# Patient Record
Sex: Female | Born: 1935 | Race: White | Hispanic: No | Marital: Married | Smoking: Former smoker
Health system: Southern US, Community
[De-identification: ages and names within clinical notes are randomized; demographics above are authoritative.]

## PROBLEM LIST (undated history)

## (undated) DIAGNOSIS — I4891 Unspecified atrial fibrillation: Secondary | ICD-10-CM

## (undated) DIAGNOSIS — F0390 Unspecified dementia without behavioral disturbance: Secondary | ICD-10-CM

## (undated) DIAGNOSIS — C801 Malignant (primary) neoplasm, unspecified: Secondary | ICD-10-CM

## (undated) DIAGNOSIS — I10 Essential (primary) hypertension: Secondary | ICD-10-CM

## (undated) DIAGNOSIS — E785 Hyperlipidemia, unspecified: Secondary | ICD-10-CM

## (undated) DIAGNOSIS — Z923 Personal history of irradiation: Secondary | ICD-10-CM

## (undated) DIAGNOSIS — K5900 Constipation, unspecified: Secondary | ICD-10-CM

## (undated) DIAGNOSIS — K219 Gastro-esophageal reflux disease without esophagitis: Secondary | ICD-10-CM

## (undated) DIAGNOSIS — E039 Hypothyroidism, unspecified: Secondary | ICD-10-CM

## (undated) HISTORY — DX: Personal history of irradiation: Z92.3

## (undated) HISTORY — PX: BREAST SURGERY: SHX581

---

## 1997-09-07 ENCOUNTER — Other Ambulatory Visit: Admission: RE | Admit: 1997-09-07 | Discharge: 1997-09-07 | Payer: Self-pay | Admitting: Family Medicine

## 1997-11-16 ENCOUNTER — Other Ambulatory Visit: Admission: RE | Admit: 1997-11-16 | Discharge: 1997-11-16 | Payer: Self-pay | Admitting: Family Medicine

## 1998-07-26 ENCOUNTER — Other Ambulatory Visit: Admission: RE | Admit: 1998-07-26 | Discharge: 1998-07-26 | Payer: Self-pay | Admitting: Obstetrics and Gynecology

## 1998-10-15 ENCOUNTER — Ambulatory Visit (HOSPITAL_COMMUNITY): Admission: RE | Admit: 1998-10-15 | Discharge: 1998-10-15 | Payer: Self-pay | Admitting: Obstetrics & Gynecology

## 1999-04-02 ENCOUNTER — Ambulatory Visit (HOSPITAL_COMMUNITY): Admission: RE | Admit: 1999-04-02 | Discharge: 1999-04-02 | Payer: Self-pay | Admitting: Obstetrics and Gynecology

## 1999-04-02 ENCOUNTER — Encounter: Payer: Self-pay | Admitting: Obstetrics and Gynecology

## 1999-05-06 DIAGNOSIS — Z923 Personal history of irradiation: Secondary | ICD-10-CM

## 1999-05-06 HISTORY — DX: Personal history of irradiation: Z92.3

## 1999-05-27 ENCOUNTER — Encounter: Payer: Self-pay | Admitting: General Surgery

## 1999-05-28 ENCOUNTER — Encounter (INDEPENDENT_AMBULATORY_CARE_PROVIDER_SITE_OTHER): Payer: Self-pay | Admitting: *Deleted

## 1999-05-28 ENCOUNTER — Ambulatory Visit (HOSPITAL_COMMUNITY): Admission: RE | Admit: 1999-05-28 | Discharge: 1999-05-28 | Payer: Self-pay | Admitting: General Surgery

## 1999-06-10 ENCOUNTER — Encounter: Admission: RE | Admit: 1999-06-10 | Discharge: 1999-06-10 | Payer: Self-pay | Admitting: General Surgery

## 1999-06-10 ENCOUNTER — Encounter: Payer: Self-pay | Admitting: General Surgery

## 1999-06-11 ENCOUNTER — Encounter: Payer: Self-pay | Admitting: General Surgery

## 1999-06-11 ENCOUNTER — Ambulatory Visit (HOSPITAL_COMMUNITY): Admission: RE | Admit: 1999-06-11 | Discharge: 1999-06-12 | Payer: Self-pay | Admitting: General Surgery

## 1999-06-11 ENCOUNTER — Encounter (INDEPENDENT_AMBULATORY_CARE_PROVIDER_SITE_OTHER): Payer: Self-pay | Admitting: *Deleted

## 1999-06-26 ENCOUNTER — Encounter: Admission: RE | Admit: 1999-06-26 | Discharge: 1999-09-24 | Payer: Self-pay | Admitting: Radiation Oncology

## 1999-12-26 ENCOUNTER — Other Ambulatory Visit: Admission: RE | Admit: 1999-12-26 | Discharge: 1999-12-26 | Payer: Self-pay | Admitting: Obstetrics and Gynecology

## 2000-03-05 ENCOUNTER — Encounter: Admission: RE | Admit: 2000-03-05 | Discharge: 2000-03-05 | Payer: Self-pay | Admitting: General Surgery

## 2000-03-05 ENCOUNTER — Encounter: Payer: Self-pay | Admitting: General Surgery

## 2000-03-11 ENCOUNTER — Ambulatory Visit (HOSPITAL_COMMUNITY): Admission: RE | Admit: 2000-03-11 | Discharge: 2000-03-11 | Payer: Self-pay | Admitting: Gastroenterology

## 2001-02-09 ENCOUNTER — Other Ambulatory Visit: Admission: RE | Admit: 2001-02-09 | Discharge: 2001-02-09 | Payer: Self-pay | Admitting: Obstetrics and Gynecology

## 2001-03-17 ENCOUNTER — Encounter: Admission: RE | Admit: 2001-03-17 | Discharge: 2001-03-17 | Payer: Self-pay | Admitting: General Surgery

## 2001-03-17 ENCOUNTER — Encounter: Payer: Self-pay | Admitting: General Surgery

## 2002-02-10 ENCOUNTER — Other Ambulatory Visit: Admission: RE | Admit: 2002-02-10 | Discharge: 2002-02-10 | Payer: Self-pay | Admitting: Obstetrics and Gynecology

## 2002-04-20 ENCOUNTER — Encounter: Admission: RE | Admit: 2002-04-20 | Discharge: 2002-04-20 | Payer: Self-pay | Admitting: General Surgery

## 2002-04-20 ENCOUNTER — Encounter: Payer: Self-pay | Admitting: General Surgery

## 2003-02-28 ENCOUNTER — Ambulatory Visit: Admission: RE | Admit: 2003-02-28 | Discharge: 2003-02-28 | Payer: Self-pay | Admitting: Radiation Oncology

## 2003-03-20 ENCOUNTER — Other Ambulatory Visit: Admission: RE | Admit: 2003-03-20 | Discharge: 2003-03-20 | Payer: Self-pay | Admitting: Obstetrics and Gynecology

## 2003-04-26 ENCOUNTER — Encounter: Admission: RE | Admit: 2003-04-26 | Discharge: 2003-04-26 | Payer: Self-pay | Admitting: Family Medicine

## 2003-07-10 ENCOUNTER — Ambulatory Visit (HOSPITAL_BASED_OUTPATIENT_CLINIC_OR_DEPARTMENT_OTHER): Admission: RE | Admit: 2003-07-10 | Discharge: 2003-07-10 | Payer: Self-pay | Admitting: Otolaryngology

## 2003-08-07 ENCOUNTER — Encounter: Admission: RE | Admit: 2003-08-07 | Discharge: 2003-08-07 | Payer: Self-pay | Admitting: Family Medicine

## 2003-09-08 ENCOUNTER — Ambulatory Visit (HOSPITAL_COMMUNITY): Admission: RE | Admit: 2003-09-08 | Discharge: 2003-09-09 | Payer: Self-pay | Admitting: Urology

## 2004-02-06 ENCOUNTER — Ambulatory Visit: Admission: RE | Admit: 2004-02-06 | Discharge: 2004-02-06 | Payer: Self-pay | Admitting: Radiation Oncology

## 2004-05-03 ENCOUNTER — Encounter: Admission: RE | Admit: 2004-05-03 | Discharge: 2004-05-03 | Payer: Self-pay | Admitting: Family Medicine

## 2004-10-09 ENCOUNTER — Ambulatory Visit: Admission: RE | Admit: 2004-10-09 | Discharge: 2004-10-09 | Payer: Self-pay | Admitting: Radiation Oncology

## 2005-05-15 ENCOUNTER — Encounter: Admission: RE | Admit: 2005-05-15 | Discharge: 2005-05-15 | Payer: Self-pay | Admitting: Family Medicine

## 2005-05-29 ENCOUNTER — Other Ambulatory Visit: Admission: RE | Admit: 2005-05-29 | Discharge: 2005-05-29 | Payer: Self-pay | Admitting: Obstetrics and Gynecology

## 2006-06-01 ENCOUNTER — Encounter: Admission: RE | Admit: 2006-06-01 | Discharge: 2006-06-01 | Payer: Self-pay | Admitting: Family Medicine

## 2006-10-06 ENCOUNTER — Ambulatory Visit: Admission: RE | Admit: 2006-10-06 | Discharge: 2006-10-06 | Payer: Self-pay | Admitting: Gynecologic Oncology

## 2006-10-06 ENCOUNTER — Encounter: Payer: Self-pay | Admitting: Gynecologic Oncology

## 2007-01-28 ENCOUNTER — Encounter: Admission: RE | Admit: 2007-01-28 | Discharge: 2007-03-30 | Payer: Self-pay | Admitting: Family Medicine

## 2007-05-14 ENCOUNTER — Encounter: Admission: RE | Admit: 2007-05-14 | Discharge: 2007-05-14 | Payer: Self-pay | Admitting: Family Medicine

## 2007-07-08 ENCOUNTER — Emergency Department (HOSPITAL_COMMUNITY): Admission: EM | Admit: 2007-07-08 | Discharge: 2007-07-08 | Payer: Self-pay | Admitting: Emergency Medicine

## 2007-10-05 ENCOUNTER — Encounter: Admission: RE | Admit: 2007-10-05 | Discharge: 2007-10-05 | Payer: Self-pay | Admitting: Family Medicine

## 2008-03-02 ENCOUNTER — Other Ambulatory Visit: Admission: RE | Admit: 2008-03-02 | Discharge: 2008-03-02 | Payer: Self-pay | Admitting: Obstetrics and Gynecology

## 2008-11-17 ENCOUNTER — Encounter: Admission: RE | Admit: 2008-11-17 | Discharge: 2008-11-17 | Payer: Self-pay | Admitting: Radiation Oncology

## 2009-11-29 ENCOUNTER — Encounter: Admission: RE | Admit: 2009-11-29 | Discharge: 2009-11-29 | Payer: Self-pay | Admitting: Family Medicine

## 2009-12-06 ENCOUNTER — Encounter (INDEPENDENT_AMBULATORY_CARE_PROVIDER_SITE_OTHER): Payer: Self-pay | Admitting: Internal Medicine

## 2009-12-06 ENCOUNTER — Ambulatory Visit: Payer: Self-pay | Admitting: Cardiology

## 2009-12-06 ENCOUNTER — Inpatient Hospital Stay (HOSPITAL_COMMUNITY): Admission: EM | Admit: 2009-12-06 | Discharge: 2009-12-10 | Payer: Self-pay | Admitting: Emergency Medicine

## 2010-07-19 LAB — CBC
HCT: 35.5 % — ABNORMAL LOW (ref 36.0–46.0)
HCT: 36.2 % (ref 36.0–46.0)
HCT: 40.7 % (ref 36.0–46.0)
Hemoglobin: 11.2 g/dL — ABNORMAL LOW (ref 12.0–15.0)
Hemoglobin: 11.4 g/dL — ABNORMAL LOW (ref 12.0–15.0)
Hemoglobin: 12 g/dL (ref 12.0–15.0)
Hemoglobin: 12.1 g/dL (ref 12.0–15.0)
Hemoglobin: 13.9 g/dL (ref 12.0–15.0)
MCH: 30.5 pg (ref 26.0–34.0)
MCH: 30.5 pg (ref 26.0–34.0)
MCH: 31.5 pg (ref 26.0–34.0)
MCHC: 32.9 g/dL (ref 30.0–36.0)
MCHC: 33.1 g/dL (ref 30.0–36.0)
MCHC: 34.1 g/dL (ref 30.0–36.0)
MCHC: 34.2 g/dL (ref 30.0–36.0)
MCV: 92.3 fL (ref 78.0–100.0)
Platelets: 148 10*3/uL — ABNORMAL LOW (ref 150–400)
Platelets: 149 10*3/uL — ABNORMAL LOW (ref 150–400)
Platelets: 160 10*3/uL (ref 150–400)
Platelets: 169 10*3/uL (ref 150–400)
RBC: 3.58 MIL/uL — ABNORMAL LOW (ref 3.87–5.11)
RBC: 3.74 MIL/uL — ABNORMAL LOW (ref 3.87–5.11)
RBC: 4.41 MIL/uL (ref 3.87–5.11)
RDW: 14 % (ref 11.5–15.5)
RDW: 14.4 % (ref 11.5–15.5)
WBC: 11.5 10*3/uL — ABNORMAL HIGH (ref 4.0–10.5)
WBC: 6.3 10*3/uL (ref 4.0–10.5)
WBC: 8.7 10*3/uL (ref 4.0–10.5)

## 2010-07-19 LAB — DIFFERENTIAL
Basophils Absolute: 0 10*3/uL (ref 0.0–0.1)
Basophils Absolute: 0 10*3/uL (ref 0.0–0.1)
Basophils Absolute: 0 10*3/uL (ref 0.0–0.1)
Basophils Relative: 0 % (ref 0–1)
Basophils Relative: 0 % (ref 0–1)
Basophils Relative: 0 % (ref 0–1)
Basophils Relative: 0 % (ref 0–1)
Eosinophils Absolute: 0.1 10*3/uL (ref 0.0–0.7)
Eosinophils Absolute: 0.1 10*3/uL (ref 0.0–0.7)
Eosinophils Absolute: 0.1 10*3/uL (ref 0.0–0.7)
Eosinophils Absolute: 0.2 10*3/uL (ref 0.0–0.7)
Eosinophils Absolute: 0.4 10*3/uL (ref 0.0–0.7)
Eosinophils Relative: 0 % (ref 0–5)
Lymphocytes Relative: 13 % (ref 12–46)
Lymphocytes Relative: 14 % (ref 12–46)
Lymphocytes Relative: 15 % (ref 12–46)
Lymphs Abs: 1 10*3/uL (ref 0.7–4.0)
Lymphs Abs: 1.4 10*3/uL (ref 0.7–4.0)
Lymphs Abs: 1.5 10*3/uL (ref 0.7–4.0)
Lymphs Abs: 1.7 10*3/uL (ref 0.7–4.0)
Monocytes Absolute: 1.2 10*3/uL — ABNORMAL HIGH (ref 0.1–1.0)
Monocytes Absolute: 1.4 10*3/uL — ABNORMAL HIGH (ref 0.1–1.0)
Monocytes Relative: 10 % (ref 3–12)
Monocytes Relative: 12 % (ref 3–12)
Monocytes Relative: 14 % — ABNORMAL HIGH (ref 3–12)
Monocytes Relative: 14 % — ABNORMAL HIGH (ref 3–12)
Neutro Abs: 4 10*3/uL (ref 1.7–7.7)
Neutro Abs: 4.6 10*3/uL (ref 1.7–7.7)
Neutro Abs: 7.4 10*3/uL (ref 1.7–7.7)
Neutro Abs: 8.6 10*3/uL — ABNORMAL HIGH (ref 1.7–7.7)
Neutrophils Relative %: 63 % (ref 43–77)
Neutrophils Relative %: 64 % (ref 43–77)
Neutrophils Relative %: 66 % (ref 43–77)
Neutrophils Relative %: 71 % (ref 43–77)
Neutrophils Relative %: 75 % (ref 43–77)

## 2010-07-19 LAB — BASIC METABOLIC PANEL
BUN: 12 mg/dL (ref 6–23)
BUN: 20 mg/dL (ref 6–23)
BUN: 7 mg/dL (ref 6–23)
CO2: 27 mEq/L (ref 19–32)
Calcium: 8.9 mg/dL (ref 8.4–10.5)
Calcium: 9.7 mg/dL (ref 8.4–10.5)
Chloride: 101 mEq/L (ref 96–112)
Chloride: 103 mEq/L (ref 96–112)
Chloride: 109 mEq/L (ref 96–112)
Creatinine, Ser: 0.74 mg/dL (ref 0.4–1.2)
Creatinine, Ser: 0.84 mg/dL (ref 0.4–1.2)
Creatinine, Ser: 1.13 mg/dL (ref 0.4–1.2)
GFR calc Af Amer: 56 mL/min — ABNORMAL LOW (ref >60)
GFR calc Af Amer: >60 mL/min (ref >60)
GFR calc non Af Amer: 46 mL/min — ABNORMAL LOW (ref >60)
GFR calc non Af Amer: >60 mL/min (ref >60)
GFR calc non Af Amer: >60 mL/min (ref >60)
GFR calc non Af Amer: >60 mL/min (ref >60)
Glucose, Bld: 123 mg/dL — ABNORMAL HIGH (ref 70–99)
Glucose, Bld: 137 mg/dL — ABNORMAL HIGH (ref 70–99)
Glucose, Bld: 154 mg/dL — ABNORMAL HIGH (ref 70–99)
Potassium: 3.1 mEq/L — ABNORMAL LOW (ref 3.5–5.1)
Potassium: 3.1 mEq/L — ABNORMAL LOW (ref 3.5–5.1)
Potassium: 3.5 mEq/L (ref 3.5–5.1)
Potassium: 4.1 mEq/L (ref 3.5–5.1)
Sodium: 135 mEq/L (ref 135–145)
Sodium: 136 mEq/L (ref 135–145)

## 2010-07-19 LAB — COMPREHENSIVE METABOLIC PANEL
ALT: 16 U/L (ref 0–35)
CO2: 24 mEq/L (ref 19–32)
Calcium: 8.7 mg/dL (ref 8.4–10.5)
Chloride: 108 mEq/L (ref 96–112)
Creatinine, Ser: 0.73 mg/dL (ref 0.4–1.2)
GFR calc non Af Amer: >60 mL/min (ref >60)
Glucose, Bld: 107 mg/dL — ABNORMAL HIGH (ref 70–99)
Sodium: 139 mEq/L (ref 135–145)
Total Bilirubin: 1.4 mg/dL — ABNORMAL HIGH (ref 0.3–1.2)

## 2010-07-19 LAB — T4, FREE: Free T4: 1.4 ng/dL (ref 0.80–1.80)

## 2010-07-19 LAB — CULTURE, BLOOD (ROUTINE X 2): Culture: NO GROWTH

## 2010-07-19 LAB — URINALYSIS, ROUTINE W REFLEX MICROSCOPIC
Glucose, UA: NEGATIVE mg/dL
pH: 6 (ref 5.0–8.0)

## 2010-07-19 LAB — LIPASE, BLOOD: Lipase: 29 U/L (ref 11–59)

## 2010-07-19 LAB — CARDIAC PANEL(CRET KIN+CKTOT+MB+TROPI)
CK, MB: 1.6 ng/mL (ref 0.3–4.0)
Relative Index: INVALID (ref 0.0–2.5)
Troponin I: 0.02 ng/mL (ref 0.00–0.06)
Troponin I: 0.02 ng/mL (ref 0.00–0.06)

## 2010-07-19 LAB — LIPID PANEL
Cholesterol: 158 mg/dL (ref 0–200)
HDL: 78 mg/dL (ref >39)
LDL Cholesterol: 73 mg/dL (ref 0–99)
Total CHOL/HDL Ratio: 2 RATIO
Triglycerides: 35 mg/dL (ref <150)
VLDL: 7 mg/dL (ref 0–40)

## 2010-07-19 LAB — PROTIME-INR
INR: 1.23 (ref 0.00–1.49)
INR: 1.24 (ref 0.00–1.49)
Prothrombin Time: 15.7 seconds — ABNORMAL HIGH (ref 11.6–15.2)
Prothrombin Time: 15.8 seconds — ABNORMAL HIGH (ref 11.6–15.2)

## 2010-07-19 LAB — HEPATIC FUNCTION PANEL
ALT: 18 U/L (ref 0–35)
AST: 32 U/L (ref 0–37)
Albumin: 3.9 g/dL (ref 3.5–5.2)
Alkaline Phosphatase: 54 U/L (ref 39–117)
Bilirubin, Direct: 0.2 mg/dL (ref 0.0–0.3)
Indirect Bilirubin: 0.3 mg/dL (ref 0.3–0.9)
Total Bilirubin: 0.5 mg/dL (ref 0.3–1.2)
Total Protein: 6.3 g/dL (ref 6.0–8.3)

## 2010-07-19 LAB — TROPONIN I: Troponin I: 0.01 ng/mL (ref 0.00–0.06)

## 2010-07-19 LAB — HEMOGLOBIN A1C
Hgb A1c MFr Bld: 5.7 % — ABNORMAL HIGH (ref <5.7)
Mean Plasma Glucose: 117 mg/dL — ABNORMAL HIGH (ref <117)

## 2010-07-19 LAB — POCT CARDIAC MARKERS
CKMB, poc: 1.9 ng/mL (ref 1.0–8.0)
Myoglobin, poc: 82.5 ng/mL (ref 12–200)
Troponin i, poc: <0.05 ng/mL (ref 0.00–0.09)

## 2010-07-19 LAB — URINE MICROSCOPIC-ADD ON

## 2010-07-19 LAB — CK TOTAL AND CKMB (NOT AT ARMC)
CK, MB: 2.5 ng/mL (ref 0.3–4.0)
Relative Index: 2.5 (ref 0.0–2.5)
Total CK: 100 U/L (ref 7–177)

## 2010-07-19 LAB — URINE CULTURE

## 2010-07-19 LAB — TSH: TSH: 1.11 u[IU]/mL (ref 0.350–4.500)

## 2010-07-19 LAB — MAGNESIUM: Magnesium: 2.2 mg/dL (ref 1.5–2.5)

## 2010-09-17 NOTE — H&P (Signed)
NAME:  Shannon [name], Shannon [name]                ACCOUNT NO.:  [id number]   MEDICAL RECORD NO.:  [id number]          PATIENT TYPE:  OUT   LOCATION:  GYN                          FACILITY:  [facility]   PHYSICIAN:  Shannon A. Duard Brady, MD    DATE OF BIRTH:  1925/01/17   DATE OF ADMISSION:  10/06/2006  DATE OF DISCHARGE:                              HISTORY & PHYSICAL   REFERRING PHYSICIAN:  Artist [name], M.D.   HISTORY OF PRESENT ILLNESS:  Shannon [name] is an 77 year old, gravida 2,  para 2 who was seen by her Shannon [name] for her annual examination.  At  that time a right clitoral, warty lesion was noted.  She subsequent is  referred to [place] for that issue.  She herself otherwise has no symptoms  from that.  She denies any bleeding and she has never had any other  vulvar lesions.   REVIEW OF SYSTEMS:  She denies any chest pain, short of breath, nausea,  vomiting, fevers, chills, vulvar bleeding, vulvar itching, pruritus,  vaginal bleeding, rectal bleeding, change in bowel or bladder habits,  night sweats, unintentional weight loss, weight gain, headaches, or  visual changes.   PAST MEDICAL HISTORY:  1. Hypothyroidism.  2. Hypertension.  3. Hypercholesterolemia.  4. Osteopenia.  5. Breast cancer.   MEDICATIONS:  1. Synthroid 100 mcg daily.  2. Maxzide 37.5/25 mg p.o. daily.  3. Nitrofurantoin 10 mg daily.  4. Lipitor 10 mg daily.  5. Fosamax 35 mg weekly.   PAST SURGICAL HISTORY:  1. Left lumpectomy in 2001.  2. Bladder sling.   ALLERGIES:  DEMEROL which causes nausea.  SULFA and PENICILLIN cause  hives.   SOCIAL HISTORY:  She denies use of alcohol.  She is married.  She drinks  a couple drinks a week.   FAMILY HISTORY:  Her mother died at the age of 45, was otherwise alive  and well.  Her father and one brother both died of lung cancer.  She had  another brother who died of alcoholism.   HEALTH MAINTENANCE:  She is up-to-date on her mammograms, Pap smears,  and colonoscopy.  Her last  colonoscopy was October 2006.   PHYSICAL EXAMINATION:  VITAL SIGNS:  Weight 150 pounds, height 5 feet 3  inches, blood pressure 160/82, pulse 84.  GENERAL:  Well-nourished, well-developed  female in no acute distress.  NECK:  Supple with no lymphadenopathy, no thyromegaly.  LUNGS:  Clear to auscultation bilaterally.  CARDIOVASCULAR:  Regular rate and rhythm.  ABDOMEN:  Abdomen is soft and nontender with no palpable masses or  hepatosplenomegaly.  Groins are negative for adenopathy.  EXTREMITIES:  There is no edema.  PELVIC:  External genitalia is notable for some asymmetry of the right  labia with the right labia minora being much smaller than the left labia  minora.  Right at the very inferior edge of the clitoral hood on the  right side just before entering the labia minora, at the crus  of the  clitoris, there is a small, approximately 4 cm, raised,warty-type  lesion.  After obtaining the patient's consent, 0.5 mL  of 1% lidocaine  was injected and the lesion was biopsied away in its entirety.  using  Shannon [name] biopsy forceps.  Hemostasis was obtained using silver nitrate.  The patient tolerated the procedure well.   ASSESSMENT:  An 77 year old with small vulvar lesion.  The patient does  give a history of obstetrical trauma and the asymmetry may be due to  that.  She denies any prior vulvar procedures being done.   PLAN:  Will follow up on the results of her biopsy from today.  Will  communicate the results to her.  Pending this does not reveal any  premalignant condition, she will continue her routine GYN care under the  care of Shannon [name].  of Shannon [name] and he Shannon [name]  ensure Shannon [name] and Shannon [name] thank you      Shannon [name] A. Duard Brady, MD  Electronically Signed     PAG/MEDQ  D:  10/06/2006  T:  10/07/2006  Job:  [id number]   cc:   Shannon [name], M.D.  Fax: [phone]   Shannon [name], R.N.  [address]  [place], [place] [address]   Shannon Lemma.  Manus [name], M.D.  Fax: [phone]   Shannon [name]  Fax: [phone]   Shannon [name], M.D.  Fax: [phone]

## 2010-09-20 NOTE — Op Note (Signed)
[place]. [facility]  Patient:    RAINNA, NEARHOOD                       MRN: [id number] Proc. Date: 06/11/99 Adm. Date:  [id number] Attending:  Carson Myrtle                           Operative Report  PREOPERATIVE DIAGNOSIS:  Carcinoma left breast.  POSTOPERATIVE DIAGNOSIS:  Carcinoma left breast.  OPERATION:  Injection of technetium, lymphatic mapping, injection of azurian blue dye.  Sentinel node biopsy.  Left partial mastectomy.  SURGEON:  Timothy E. Earlene Plater, M.D.  ANESTHESIA:  C.R.N.A. supervised M.D.  INDICATION:  Ms. Milos underwent an outpatient biopsy two weeks ago, the findings which were extensive in situ carcinoma.  She has been carefully counselled and wishes to proceed with this surgery as has been described to her.  The patient had been in x-ray this morning where technetium was injected in the  biopsy site in the upper outer quadrant of the left breast.  She is now in the operating room.  DESCRIPTION OF PROCEDURE:  The patient is taken to the operating room, placed supine.  General anesthesia administrated.  Lymph node mapping was accomplished by carefully surveying the tumor bed, left breast, and parts of the left axilla with the neoprobe.  Only one spot was located in the mid-axilla beneath the pectoralis muscle that was the least bit hot.  The tumor bed counts range from 4000 to 10,000.  The background was 0 and the one hot spot in the left axilla was approximately 30 counts.  The entire left breast and underarm were prepped azurian blue-green dye was injected into the tumor bed and carefully and gently massaged from five minutes.  After the skin was prepped and draped and the neoprobe was applied to the sterile drape, again lymph node mapping was accomplished.  There was still the one hot pot in the mid-axilla beneath the pectoralis.  This area was incised horizontally and careful blunt dissection and careful  use of the neoprobe revealed one single lymph node that was not stained blue from the dye but it was hot.  The actual lymph node itself, the counts ranging from 60 to 80 counts.  This was submitted specimen one for pathology.  Bleeding had been controlled with the cautery.  The wound was closed with 3-0 Vicryl and 4-0 Monocryl.  Attention was then turned to the left where a generous incision was made around the previous left breast biopsy site.  The skin, subcutaneous and entire tumor bed as excised.  This was clearly more than a 25% mastectomy but in order to attempt to dissect around the carcinoma in situ this was necessary.  The tumor was submitted fresh to pathology.  Bleeding was controlled with the cautery.  Meanwhile, the pathologist did call about the lymph node.  Indeed, it was negative for tumor.  The breast wound was now dry and it was closed with 2-0 Vicryl and -0 Monocryl.  Sponge, sharp, and instrument counts correct x 2.  She had tolerated it well.  Steri-Strips applied.  Dry, sterile dressing applied.  She was awakened nd taken to the recovery room in good condition. DD:  06/11/99 TD:  06/11/99 Job: [id number]

## 2010-09-20 NOTE — Op Note (Signed)
NAME:  Shannon [name], Shannon [name]                          ACCOUNT NO.:  [id number]   MEDICAL RECORD NO.:  [id number]                   PATIENT TYPE:  AMB   LOCATION:  DAY                                  FACILITY:  [facility]   PHYSICIAN:  Jamison Neighbor, M.D.               DATE OF BIRTH:  1925-04-14   DATE OF PROCEDURE:  09/08/2003  DATE OF DISCHARGE:                                 OPERATIVE REPORT   SERVICE:  Urology.   PREOPERATIVE DIAGNOSES:  Stress urinary incontinence.   POSTOPERATIVE DIAGNOSES:  Stress urinary incontinence.   PROCEDURE:  Cystoscopy, transobturator tape sling.   SURGEON:  Jamison Neighbor, M.D.   ANESTHESIA:  General.   COMPLICATIONS:  None.   DRAINS:  Foley catheter.   BRIEF HISTORY:  This 77 year old female has well documented stress urinary  incontinence. The patient has a very modest cystocele; however, it is not at  all clear that she will require a cystocele repair.  The patient does have a  desire to have this repaired. She is to undergo transobturator tape sling.  She understands the risks and benefits of the procedure including the  possibility of under correction, over correction, __________ urgency  incontinence, etc. she gave full and informed consent.   DESCRIPTION OF PROCEDURE:  After successful induction of general anesthesia,  the patient was placed in the dorsal lithotomy position, prepped with  Betadine and draped in the usual sterile fashion.  Careful bimanual  examination showed that she really did not have much uterine prolapse, there  was no significant rectocele or cystocele. It was felt those could be left  well enough along.  The patient had infiltration into the anterior vaginal  mucosa and an incision was then made directly over the mid urethra. A flap  of vaginal mucosa was developed going back to but not through the endopelvic  fascia.  Incisions were then made in the groin crease right at the level of  the clitoris. The passer system  was then passed from the lateral incision to  the vaginal incision on each side. The TOT tape was then grasped and  withdrawn placing the sling directly under the mid urethra. No tension was  applied.  The right angled clamp and the Mayo scissors could easily be  passed between the urethra and the sling.  The bladder was carefully  inspected and was free of any tumor or stones. Both ureteral orifices were  normal in configuration and location.  There was no evidence of any injury  to the bladder with the sling. The bladder neck was well supported with the  sling position but there was good flow of urine with a Crede maneuver.  The  cystoscope was withdrawn, the  mucosa was closed with a running suture of 2-0 Vicryl. The patient had  Dermabond placed on the skin and had packing placed in the vagina. She  tolerated the  procedure well and was taken to the recovery room in good  condition.                                               Jamison Neighbor, M.D.    RJE/MEDQ  D:  09/08/2003  T:  09/08/2003  Job:  [id number]

## 2010-09-20 NOTE — Op Note (Signed)
[place]. [facility]  Patient:    Shannon [name]                        MRN: [id number] Proc. Date: 05/28/99 Adm. Date:  [id number] Disc. Date: [id number] Attending:  Carson Myrtle                           Operative Report  PREOPERATIVE DIAGNOSIS:  Mass of left breast.  POSTOPERATIVE DIAGNOSIS:  Mass of left breast.  PROCEDURE:  Excisional biopsy of mass, left breast.  SURGEON:  Timothy E. Earlene Plater, M.D.  ANESTHESIA:  Local standby.  INDICATIONS:  The patient is an otherwise healthy 77 year old Caucasian female ho has had a persistent abnormality by palpation of the left upper outer quadrant.  Her mammograms are negative, but after careful examination and consideration, she wishes to proceed with an open biopsy after various options had been discussed.  DESCRIPTION OF PROCEDURE:  The patient was brought to the operating room and placed supine.  An IV started and sedation given.  The left breast was prepped and draped in the usual fashion.  The mass was located in the upper outer quadrant of the eft breast, approximately at the two to three oclock position.  It had been carefully localized prior to sedation to her satisfaction.  The area was anesthetized with 0.25% Marcaine with epinephrine after the skin was prepped and draped.  A curvilinear incision was made over the palpable mass.  Subcutaneous tissue dissected and beneath that was an indeed peculiar tissue.  It did not appear peculiar, but it had an unusual palpatory effort to it.  It may well have been  lipoma.  This entire area was excised and submitted to pathology for permanent sections.  Bleeding was carefully controlled with the cautery.  The wound was closed with 4-0 Vicryl in layers.  Steri-Strips applied and counts correct.  She tolerated it well and was moved to the recovery room in good condition.  Written and verbal instructions were given to her and her husband, and she  will be followed as an outpatient. DD:  05/28/99 TD:  05/28/99 Job: [id number]

## 2010-11-20 ENCOUNTER — Ambulatory Visit
Admission: RE | Admit: 2010-11-20 | Discharge: 2010-11-20 | Disposition: A | Discharge status: Home / Self Care | Payer: Medicare Other | Source: Ambulatory Visit | Attending: Radiation Oncology | Admitting: Radiation Oncology

## 2010-11-21 ENCOUNTER — Other Ambulatory Visit: Payer: Self-pay | Admitting: Dermatology

## 2011-01-27 LAB — CBC
Hemoglobin: 14.1
Platelets: 236
RDW: 14.4
WBC: 5.6

## 2011-01-27 LAB — URINALYSIS, ROUTINE W REFLEX MICROSCOPIC
Nitrite: NEGATIVE
Specific Gravity, Urine: 1.007
pH: 7

## 2011-01-27 LAB — BASIC METABOLIC PANEL
BUN: 25 — ABNORMAL HIGH
Calcium: 9.7
GFR calc non Af Amer: 45 — ABNORMAL LOW
Glucose, Bld: 74
Sodium: 137

## 2011-01-27 LAB — POCT CARDIAC MARKERS: CKMB, poc: <1 — ABNORMAL LOW

## 2011-01-27 LAB — DIFFERENTIAL
Basophils Absolute: 0
Lymphocytes Relative: 39
Neutro Abs: 2.6

## 2011-01-27 LAB — URINE MICROSCOPIC-ADD ON

## 2011-06-30 ENCOUNTER — Other Ambulatory Visit: Payer: Self-pay

## 2011-06-30 ENCOUNTER — Emergency Department (HOSPITAL_COMMUNITY): Payer: Medicare Other

## 2011-06-30 ENCOUNTER — Inpatient Hospital Stay (HOSPITAL_COMMUNITY)
Admission: EM | Admit: 2011-06-30 | Discharge: 2011-07-08 | DRG: 082 | Disposition: A | Discharge status: Skilled Nursing Facility | Payer: Medicare Other | Attending: Internal Medicine | Admitting: Internal Medicine

## 2011-06-30 ENCOUNTER — Encounter (HOSPITAL_COMMUNITY): Payer: Self-pay | Admitting: Internal Medicine

## 2011-06-30 DIAGNOSIS — Y998 Other external cause status: Secondary | ICD-10-CM

## 2011-06-30 DIAGNOSIS — I4891 Unspecified atrial fibrillation: Secondary | ICD-10-CM

## 2011-06-30 DIAGNOSIS — S065X9A Traumatic subdural hemorrhage with loss of consciousness of unspecified duration, initial encounter: Secondary | ICD-10-CM | POA: Diagnosis present

## 2011-06-30 DIAGNOSIS — G936 Cerebral edema: Secondary | ICD-10-CM | POA: Diagnosis present

## 2011-06-30 DIAGNOSIS — A498 Other bacterial infections of unspecified site: Secondary | ICD-10-CM | POA: Diagnosis present

## 2011-06-30 DIAGNOSIS — S02119A Unspecified fracture of occiput, initial encounter for closed fracture: Secondary | ICD-10-CM | POA: Diagnosis present

## 2011-06-30 DIAGNOSIS — W19XXXA Unspecified fall, initial encounter: Secondary | ICD-10-CM | POA: Diagnosis present

## 2011-06-30 DIAGNOSIS — Y92009 Unspecified place in unspecified non-institutional (private) residence as the place of occurrence of the external cause: Secondary | ICD-10-CM

## 2011-06-30 DIAGNOSIS — N390 Urinary tract infection, site not specified: Secondary | ICD-10-CM | POA: Diagnosis present

## 2011-06-30 DIAGNOSIS — R791 Abnormal coagulation profile: Secondary | ICD-10-CM | POA: Diagnosis present

## 2011-06-30 DIAGNOSIS — S02109A Fracture of base of skull, unspecified side, initial encounter for closed fracture: Principal | ICD-10-CM | POA: Diagnosis present

## 2011-06-30 DIAGNOSIS — R296 Repeated falls: Secondary | ICD-10-CM | POA: Diagnosis present

## 2011-06-30 DIAGNOSIS — E039 Hypothyroidism, unspecified: Secondary | ICD-10-CM | POA: Diagnosis present

## 2011-06-30 DIAGNOSIS — T45515A Adverse effect of anticoagulants, initial encounter: Secondary | ICD-10-CM | POA: Diagnosis present

## 2011-06-30 DIAGNOSIS — D6832 Hemorrhagic disorder due to extrinsic circulating anticoagulants: Secondary | ICD-10-CM | POA: Diagnosis present

## 2011-06-30 HISTORY — DX: Malignant (primary) neoplasm, unspecified: C80.1

## 2011-06-30 HISTORY — DX: Essential (primary) hypertension: I10

## 2011-06-30 HISTORY — DX: Gastro-esophageal reflux disease without esophagitis: K21.9

## 2011-06-30 HISTORY — DX: Hypothyroidism, unspecified: E03.9

## 2011-06-30 LAB — PROTIME-INR
INR: 2.23 — ABNORMAL HIGH (ref 0.00–1.49)
Prothrombin Time: 25.1 seconds — ABNORMAL HIGH (ref 11.6–15.2)

## 2011-06-30 LAB — BASIC METABOLIC PANEL
BUN: 17 mg/dL (ref 6–23)
CO2: 24 mEq/L (ref 19–32)
Calcium: 10.2 mg/dL (ref 8.4–10.5)
Chloride: 105 mEq/L (ref 96–112)
Creatinine, Ser: 0.89 mg/dL (ref 0.50–1.10)
GFR calc Af Amer: 67 mL/min — ABNORMAL LOW (ref >90)
GFR calc non Af Amer: 57 mL/min — ABNORMAL LOW (ref >90)
Glucose, Bld: 94 mg/dL (ref 70–99)
Potassium: 4.1 mEq/L (ref 3.5–5.1)
Sodium: 138 mEq/L (ref 135–145)

## 2011-06-30 LAB — CBC
HCT: 40.4 % (ref 36.0–46.0)
Hemoglobin: 13.6 g/dL (ref 12.0–15.0)
MCH: 31.3 pg (ref 26.0–34.0)
MCHC: 33.7 g/dL (ref 30.0–36.0)
MCV: 93.1 fL (ref 78.0–100.0)
Platelets: 160 10*3/uL (ref 150–400)
RBC: 4.34 MIL/uL (ref 3.87–5.11)
RDW: 14.5 % (ref 11.5–15.5)
WBC: 5.4 10*3/uL (ref 4.0–10.5)

## 2011-06-30 MED ORDER — ANTIINHIBITOR COAGULANT CMPLX IV SOLR
500.0000 [IU] | INTRAVENOUS | Status: DC
  Filled 2011-06-30 (×2): qty 500

## 2011-06-30 MED ORDER — ONDANSETRON HCL 4 MG/2ML IJ SOLN
INTRAMUSCULAR | Status: AC
  Administered 2011-06-30: 4 mg via INTRAVENOUS
  Filled 2011-06-30: qty 2

## 2011-06-30 MED ORDER — VITAMIN K1 10 MG/ML IJ SOLN
10.0000 mg | Freq: Once | INTRAVENOUS | Status: AC
  Administered 2011-06-30: 10 mg via INTRAVENOUS
  Filled 2011-06-30: qty 1

## 2011-06-30 MED ORDER — ANTIINHIBITOR COAGULANT CMPLX IV SOLR
500.0000 [IU] | Freq: Once | INTRAVENOUS | Status: DC | PRN
  Filled 2011-06-30 (×2): qty 500

## 2011-06-30 MED ORDER — ANTIINHIBITOR COAGULANT CMPLX IV SOLR
500.0000 [IU] | Freq: Once | INTRAVENOUS | Status: AC
  Administered 2011-06-30: 500 [IU] via INTRAVENOUS
  Filled 2011-06-30: qty 500

## 2011-06-30 MED ORDER — VITAMIN K1 10 MG/ML IJ SOLN
10.0000 mg | INTRAVENOUS | Status: DC
  Filled 2011-06-30: qty 1

## 2011-06-30 MED ORDER — ONDANSETRON HCL 4 MG/2ML IJ SOLN
4.0000 mg | Freq: Once | INTRAMUSCULAR | Status: AC
  Administered 2011-06-30: 4 mg via INTRAVENOUS

## 2011-06-30 NOTE — ED Notes (Signed)
Pt and husband updated on POC; educated pt on scans and reasons for scans; pt verbalized understanding;

## 2011-06-30 NOTE — ED Provider Notes (Signed)
I saw and evaluated the patient, reviewed the resident's note and I agree with the findings and plan.  87yf s/p mechanical fall. Was trying to open heavy door when hands slipped and fell backwards. Struck back of head. No apparent LOC. On arrival pt with GCs of 14, V4. Some repetitive questioning. Neuro exam otherwise nonfocal. Pt on coumadin for hx of afib. CT head with non displaced L occipital fx and probable small SDH. Anticoagulant reversal initiated. Admit. Small scalp lac. Will close.   Ct Head Wo Contrast  06/30/2011  *RADIOLOGY REPORT*  Clinical Data:  fall. LACERATION  CT HEAD WITHOUT CONTRAST CT CERVICAL SPINE WITHOUT CONTRAST  Technique:  Multidetector CT imaging of the head and cervical spine was performed following the standard protocol without IV contrast. Multiplanar CT image reconstructions of the cervical spine were also generated.  Comparison: None  CT HEAD  Findings: There is left occipital scalp soft tissue swelling. There is a nondisplaced left occipital bone fracture. Suspect a left tentorial subdural hematoma extending to the left middle cranial fossa at its medial aspect. Diffuse atrophy.  The lateral ventricles are dilated out of portion to the mildly prominent sulci.  There is no midline shift.  No focal parenchymal edema, mass, or mass effect.  IMPRESSION:  1.  Nondisplaced left occipital bone fracture. 2.  Probable small left tentorial subdural hematoma without significant mass effect. I telephoned the critical test results to Dr. Juleen China at the time of interpretation. 3. Possible normal pressure (communicating) hydrocephalus.  CT CERVICAL SPINE  Findings: Left occipital bone fracture is again evident. Normal cervical spine alignment.  There is narrowing of interspaces C2-3, C4-5, C5-6, C6-7 with endplate spurring Z6-X0 posteriorly.  Facets seated with multilevel degenerative   hypertrophic changes.  No fracture.  Facet and uncovertebral disease result in right-sided foraminal  encroachment C3-4 and C4-5.  There is no prevertebral soft tissue swelling.  Visualized lung apices clear.  There is calcified plaque in the left carotid bifurcation.  IMPRESSION:  1.  Negative for fracture or other acute cervical spine abnormality. 2.  Multilevel degenerative changes as above. 3.  Left carotid bifurcation calcified plaque.  Original Report Authenticated By: Osa Craver, M.D.   Ct Cervical Spine Wo Contrast  06/30/2011  *RADIOLOGY REPORT*  Clinical Data:  fall. LACERATION  CT HEAD WITHOUT CONTRAST CT CERVICAL SPINE WITHOUT CONTRAST  Technique:  Multidetector CT imaging of the head and cervical spine was performed following the standard protocol without IV contrast. Multiplanar CT image reconstructions of the cervical spine were also generated.  Comparison: None  CT HEAD  Findings: There is left occipital scalp soft tissue swelling. There is a nondisplaced left occipital bone fracture. Suspect a left tentorial subdural hematoma extending to the left middle cranial fossa at its medial aspect. Diffuse atrophy.  The lateral ventricles are dilated out of portion to the mildly prominent sulci.  There is no midline shift.  No focal parenchymal edema, mass, or mass effect.  IMPRESSION:  1.  Nondisplaced left occipital bone fracture. 2.  Probable small left tentorial subdural hematoma without significant mass effect. I telephoned the critical test results to Dr. Juleen China at the time of interpretation. 3. Possible normal pressure (communicating) hydrocephalus.  CT CERVICAL SPINE  Findings: Left occipital bone fracture is again evident. Normal cervical spine alignment.  There is narrowing of interspaces C2-3, C4-5, C5-6, C6-7 with endplate spurring R6-E4 posteriorly.  Facets seated with multilevel degenerative   hypertrophic changes.  No fracture.  Facet and  uncovertebral disease result in right-sided foraminal encroachment C3-4 and C4-5.  There is no prevertebral soft tissue swelling.  Visualized  lung apices clear.  There is calcified plaque in the left carotid bifurcation.  IMPRESSION:  1.  Negative for fracture or other acute cervical spine abnormality. 2.  Multilevel degenerative changes as above. 3.  Left carotid bifurcation calcified plaque.  Original Report Authenticated By: Osa Craver, M.D.   Dg Chest Port 1 View  06/30/2011  *RADIOLOGY REPORT*  Clinical Data: Fall and rule out aspiration.  PORTABLE CHEST - 1 VIEW  Comparison: None.  Findings: Single view of the chest was obtained.  The aortic arch is prominent with calcifications.  Few linear densities at the left lung base could represent atelectasis but an early aspiration cannot be excluded.  Upper lungs are clear.  Heart size is upper limits of normal.  IMPRESSION: Linear densities at the left lung base most likely represent atelectasis but early aspiration cannot be excluded.  Mild enlargement of the cardiac silhouette.  Original Report Authenticated By: Richarda Overlie, M.D.    9:46 PM Case was discussed with Dr. Wynetta Emery, neurosurgery. Is requesting medicine admit. Anticoagulant reversal and observation. Repeat CAT scan in the morning. Neurosurgery will follow. Also discussed case with Dr. Magnus Ivan, trauma surgery. Given that isolated head injury, no need for trauma service at this time.   CRITICAL CARE Performed by: Raeford Razor   Total critical care time: 35 minutes.  Critical care time was exclusive of separately billable procedures and treating other patients.  Critical care was necessary to treat or prevent imminent or life-threatening deterioration.  Critical care was time spent personally by me on the following activities: development of treatment plan with patient and/or surrogate as well as nursing, discussions with consultants, evaluation of patient's response to treatment, examination of patient, obtaining history from patient or surrogate, ordering and performing treatments and interventions, ordering and  review of laboratory studies, ordering and review of radiographic studies, pulse oximetry and re-evaluation of patient's condition.  Raeford Razor, MD 06/30/11 2206

## 2011-06-30 NOTE — ED Notes (Signed)
EDP at bedside speaking with pt/family re: results;

## 2011-06-30 NOTE — ED Notes (Signed)
Dr Wynetta Emery at bedside speaking with pt and husband

## 2011-06-30 NOTE — ED Provider Notes (Signed)
History     CSN: [id number]  Arrival date & time 06/30/11  2006   First MD Initiated Contact with Patient 06/30/11 2013      No chief complaint on file.   (Consider location/radiation/quality/duration/timing/severity/associated sxs/prior treatment) Patient is a 78 y.o. female presenting with fall. The history is provided by the EMS personnel and the patient.  Fall The accident occurred 1 to 2 hours ago. The fall occurred while walking. Distance fallen: From standing. He landed on a hard floor. The volume of blood lost was minimal. The point of impact was the head. The pain is present in the head. The pain is moderate. He was not ambulatory at the scene. There was no entrapment after the fall. Associated symptoms include nausea, vomiting and loss of consciousness. Exacerbated by: Nothing. Treatment on scene includes a backboard.    Past Medical History  Diagnosis Date  . GERD (gastroesophageal reflux disease)   . Hypertension   . Hypothyroidism   . Cancer     Past Surgical History  Procedure Date  . Breast surgery     History reviewed. No pertinent family history.  History  Substance Use Topics  . Smoking status: Not on file  . Smokeless tobacco: Not on file  . Alcohol Use: Not on file    OB History    No data available      Review of Systems  Gastrointestinal: Positive for nausea and vomiting.  Neurological: Positive for loss of consciousness.  All other systems reviewed and are negative.    Allergies  Review of patient's allergies indicates not on file.  Home Medications  No current outpatient prescriptions on file.  SpO2 91%  Physical Exam  Constitutional: He appears well-developed and well-nourished. No distress.  HENT:  Head: Normocephalic.       Small stellate laceration of mid occiput without skull depression or deformity.   Eyes: Pupils are equal, round, and reactive to light.       Pupils 4 mm reactive bilaterally.  Neck: Neck supple.        No tenderness, step-offs or deformities of midline C-spine.  Cardiovascular: Normal rate and regular rhythm.   No murmur heard. Pulmonary/Chest: Effort normal and breath sounds normal. No respiratory distress. He exhibits no tenderness.       No tenderness with compression. No external signs of trauma. Breath sounds are symmetric bilaterally.  Abdominal: Soft. There is no tenderness.       No external signs of trauma.  Musculoskeletal: Normal range of motion. He exhibits no edema and no tenderness.       Full passive range of motion of both hips without any pain.  Skin: Skin is warm and dry.  Psychiatric: He has a normal mood and affect.    ED Course  Procedures (including critical care time)  Results for orders placed during the hospital encounter of 06/30/11  CBC      Component Value Range   WBC 5.4  4.0 - 10.5 (K/uL)   RBC 4.34  3.87 - 5.11 (MIL/uL)   Hemoglobin 13.6  12.0 - 15.0 (g/dL)   HCT 40.9  81.1 - 91.4 (%)   MCV 93.1  78.0 - 100.0 (fL)   MCH 31.3  26.0 - 34.0 (pg)   MCHC 33.7  30.0 - 36.0 (g/dL)   RDW 78.2  95.6 - 21.3 (%)   Platelets 160  150 - 400 (K/uL)  BASIC METABOLIC PANEL      Component Value Range  Sodium 138  135 - 145 (mEq/L)   Potassium 4.1  3.5 - 5.1 (mEq/L)   Chloride 105  96 - 112 (mEq/L)   CO2 24  19 - 32 (mEq/L)   Glucose, Bld 94  70 - 99 (mg/dL)   BUN 17  6 - 23 (mg/dL)   Creatinine, Ser 1.61  0.50 - 1.10 (mg/dL)   Calcium 09.6  8.4 - 10.5 (mg/dL)   GFR calc non Af Amer 57 (*) >90 (mL/min)   GFR calc Af Amer 67 (*) >90 (mL/min)  PROTIME-INR      Component Value Range   Prothrombin Time 25.1 (*) 11.6 - 15.2 (seconds)   INR 2.23 (*) 0.00 - 1.49   PREPARE FRESH FROZEN PLASMA      Component Value Range   Unit Number [id number]     Blood Component Type THAWED PLASMA     Unit division 00     Status of Unit ISSUED     Transfusion Status OK TO TRANSFUSE     Unit Number [id number]     Blood Component Type THAWED PLASMA     Unit division 00      Status of Unit ALLOCATED     Transfusion Status OK TO TRANSFUSE    TYPE AND SCREEN      Component Value Range   ABO/RH(D) A POS     Antibody Screen NEG     Sample Expiration 07/03/2011    PROTIME-INR      Component Value Range   Prothrombin Time 25.8 (*) 11.6 - 15.2 (seconds)   INR 2.31 (*) 0.00 - 1.49   ABO/RH      Component Value Range   ABO/RH(D) A POS      Ct Head Wo Contrast  06/30/2011  *RADIOLOGY REPORT*  Clinical Data:  fall. LACERATION  CT HEAD WITHOUT CONTRAST CT CERVICAL SPINE WITHOUT CONTRAST  Technique:  Multidetector CT imaging of the head and cervical spine was performed following the standard protocol without IV contrast. Multiplanar CT image reconstructions of the cervical spine were also generated.  Comparison: None  CT HEAD  Findings: There is left occipital scalp soft tissue swelling. There is a nondisplaced left occipital bone fracture. Suspect a left tentorial subdural hematoma extending to the left middle cranial fossa at its medial aspect. Diffuse atrophy.  The lateral ventricles are dilated out of portion to the mildly prominent sulci.  There is no midline shift.  No focal parenchymal edema, mass, or mass effect.  IMPRESSION:  1.  Nondisplaced left occipital bone fracture. 2.  Probable small left tentorial subdural hematoma without significant mass effect. I telephoned the critical test results to Dr. Juleen China at the time of interpretation. 3. Possible normal pressure (communicating) hydrocephalus.  CT CERVICAL SPINE  Findings: Left occipital bone fracture is again evident. Normal cervical spine alignment.  There is narrowing of interspaces C2-3, C4-5, C5-6, C6-7 with endplate spurring O1-H0 posteriorly.  Facets seated with multilevel degenerative   hypertrophic changes.  No fracture.  Facet and uncovertebral disease result in right-sided foraminal encroachment C3-4 and C4-5.  There is no prevertebral soft tissue swelling.  Visualized lung apices clear.  There is calcified  plaque in the left carotid bifurcation.  IMPRESSION:  1.  Negative for fracture or other acute cervical spine abnormality. 2.  Multilevel degenerative changes as above. 3.  Left carotid bifurcation calcified plaque.  Original Report Authenticated By: Osa Craver, M.D.   Ct Cervical Spine Wo Contrast  06/30/2011  *RADIOLOGY  REPORT*  Clinical Data:  fall. LACERATION  CT HEAD WITHOUT CONTRAST CT CERVICAL SPINE WITHOUT CONTRAST  Technique:  Multidetector CT imaging of the head and cervical spine was performed following the standard protocol without IV contrast. Multiplanar CT image reconstructions of the cervical spine were also generated.  Comparison: None  CT HEAD  Findings: There is left occipital scalp soft tissue swelling. There is a nondisplaced left occipital bone fracture. Suspect a left tentorial subdural hematoma extending to the left middle cranial fossa at its medial aspect. Diffuse atrophy.  The lateral ventricles are dilated out of portion to the mildly prominent sulci.  There is no midline shift.  No focal parenchymal edema, mass, or mass effect.  IMPRESSION:  1.  Nondisplaced left occipital bone fracture. 2.  Probable small left tentorial subdural hematoma without significant mass effect. I telephoned the critical test results to Dr. Juleen China at the time of interpretation. 3. Possible normal pressure (communicating) hydrocephalus.  CT CERVICAL SPINE  Findings: Left occipital bone fracture is again evident. Normal cervical spine alignment.  There is narrowing of interspaces C2-3, C4-5, C5-6, C6-7 with endplate spurring Z6-X0 posteriorly.  Facets seated with multilevel degenerative   hypertrophic changes.  No fracture.  Facet and uncovertebral disease result in right-sided foraminal encroachment C3-4 and C4-5.  There is no prevertebral soft tissue swelling.  Visualized lung apices clear.  There is calcified plaque in the left carotid bifurcation.  IMPRESSION:  1.  Negative for fracture or other  acute cervical spine abnormality. 2.  Multilevel degenerative changes as above. 3.  Left carotid bifurcation calcified plaque.  Original Report Authenticated By: Osa Craver, M.D.   Dg Chest Port 1 View  06/30/2011  *RADIOLOGY REPORT*  Clinical Data: Fall and rule out aspiration.  PORTABLE CHEST - 1 VIEW  Comparison: None.  Findings: Single view of the chest was obtained.  The aortic arch is prominent with calcifications.  Few linear densities at the left lung base could represent atelectasis but an early aspiration cannot be excluded.  Upper lungs are clear.  Heart size is upper limits of normal.  IMPRESSION: Linear densities at the left lung base most likely represent atelectasis but early aspiration cannot be excluded.  Mild enlargement of the cardiac silhouette.  Original Report Authenticated By: Richarda Overlie, M.D.   Independently viewed by me, interpreted by and discussed with radiologist.  1. Subdural hematoma   2. Fall   3. Fracture Of Occipital Bone       MDM  16:43 PM -year-old female presenting as a level II trauma after having a fall at home. Patient was apparently trying to pull back on a door when she slipped and fell backward hitting the back of her head. She had a positive LOC for roughly 5 minutes. She is amnestic to the event. She vomited several times en route with EMS. She is on Coumadin for history of atrial fibrillation. She is oriented to person and place. She does have an area of bleeding in the posterior occiput. She is moving all extremities, pupils are equal and reactive. Her airway is intact and she has bilateral breath sounds. No other signs of trauma were noted. Given that she is unreliable historian we'll check a CBC, B eD MP, PT/INR, a screening EKG, CT of the head and cervical spine and chest x-ray.  10:05 PM imaging is significant for small occipital fracture and small left subdural hematoma. INR is 2.23.  given vitamin K, FFP and FEIBA. Neurosurgery and trauma  to the consulted and will evaluate the patient. Medicine has been consulted for admission.      Sheran Luz, MD 07/01/11 [phone]

## 2011-06-30 NOTE — Progress Notes (Signed)
Orthopedic Tech Progress Note Patient Details:  Shannon [name] 08/15/1924 [id number]  Patient ID: [name], female   DOB: 08-Oct-1924, 78 y.o.   MRN: [id number] Made trauma visit  [name] 06/30/2011, 8:45 PM

## 2011-06-30 NOTE — ED Notes (Signed)
Pt's husband went to son's house to bring him to hospital

## 2011-06-30 NOTE — ED Notes (Signed)
Pt to CT scan.

## 2011-06-30 NOTE — Consult Note (Signed)
Reason for Consult: Closed head injury Referring Physician: ER Dr. Raeford Razor  Shannon [name] is an 78 y.o. female.  HPI: Patient is an 78 year old female with a history of atrial fibrillation on Coumadin and dementia who had sustained a ground-level fall on a concrete she was amnestic for the event as well as she did lose consciousness for 45 minutes around the timing of the event. Currently the patient denies any significant headache she is nauseated she denies any vision changes denies any neck pain she has some pain about her right ear. She denies any numbness and tingling in her arms or her legs.  No past medical history on file.  No past surgical history on file.  No family history on file.  Social History:  does not have a smoking history on file. She does not have any smokeless tobacco history on file. Her alcohol and drug histories not on file.  Allergies:  Allergies  Allergen Reactions  . Demerol Nausea And Vomiting  . Tequin Nausea Only  . Penicillins Rash    Medications: I have reviewed the patient's current medications.  Results for orders placed during the hospital encounter of 06/30/11 (from the past 48 hour(s))  CBC     Status: Normal   Collection Time   06/30/11  8:18 PM      Component Value Range Comment   WBC 5.4  4.0 - 10.5 (K/uL)    RBC 4.34  3.87 - 5.11 (MIL/uL)    Hemoglobin 13.6  12.0 - 15.0 (g/dL)    HCT 16.1  09.6 - 04.5 (%)    MCV 93.1  78.0 - 100.0 (fL)    MCH 31.3  26.0 - 34.0 (pg)    MCHC 33.7  30.0 - 36.0 (g/dL)    RDW 40.9  81.1 - 91.4 (%)    Platelets 160  150 - 400 (K/uL)   BASIC METABOLIC PANEL     Status: Abnormal   Collection Time   06/30/11  8:18 PM      Component Value Range Comment   Sodium 138  135 - 145 (mEq/L)    Potassium 4.1  3.5 - 5.1 (mEq/L)    Chloride 105  96 - 112 (mEq/L)    CO2 24  19 - 32 (mEq/L)    Glucose, Bld 94  70 - 99 (mg/dL)    BUN 17  6 - 23 (mg/dL)    Creatinine, Ser 7.82  0.50 - 1.10 (mg/dL)    Calcium  95.6  8.4 - 10.5 (mg/dL)    GFR calc non Af Amer 57 (*) >90 (mL/min)    GFR calc Af Amer 67 (*) >90 (mL/min)   PROTIME-INR     Status: Abnormal   Collection Time   06/30/11  8:18 PM      Component Value Range Comment   Prothrombin Time 25.1 (*) 11.6 - 15.2 (seconds)    INR 2.23 (*) 0.00 - 1.49    TYPE AND SCREEN     Status: Normal (Preliminary result)   Collection Time   06/30/11  9:55 PM      Component Value Range Comment   ABO/RH(D) A POS      Antibody Screen PENDING      Sample Expiration 07/03/2011       Ct Head Wo Contrast  06/30/2011  *RADIOLOGY REPORT*  Clinical Data:  fall. LACERATION  CT HEAD WITHOUT CONTRAST CT CERVICAL SPINE WITHOUT CONTRAST  Technique:  Multidetector CT imaging of the head and  cervical spine was performed following the standard protocol without IV contrast. Multiplanar CT image reconstructions of the cervical spine were also generated.  Comparison: None  CT HEAD  Findings: There is left occipital scalp soft tissue swelling. There is a nondisplaced left occipital bone fracture. Suspect a left tentorial subdural hematoma extending to the left middle cranial fossa at its medial aspect. Diffuse atrophy.  The lateral ventricles are dilated out of portion to the mildly prominent sulci.  There is no midline shift.  No focal parenchymal edema, mass, or mass effect.  IMPRESSION:  1.  Nondisplaced left occipital bone fracture. 2.  Probable small left tentorial subdural hematoma without significant mass effect. I telephoned the critical test results to Dr. Juleen China at the time of interpretation. 3. Possible normal pressure (communicating) hydrocephalus.  CT CERVICAL SPINE  Findings: Left occipital bone fracture is again evident. Normal cervical spine alignment.  There is narrowing of interspaces C2-3, C4-5, C5-6, C6-7 with endplate spurring Z6-X0 posteriorly.  Facets seated with multilevel degenerative   hypertrophic changes.  No fracture.  Facet and uncovertebral disease result in  right-sided foraminal encroachment C3-4 and C4-5.  There is no prevertebral soft tissue swelling.  Visualized lung apices clear.  There is calcified plaque in the left carotid bifurcation.  IMPRESSION:  1.  Negative for fracture or other acute cervical spine abnormality. 2.  Multilevel degenerative changes as above. 3.  Left carotid bifurcation calcified plaque.  Original Report Authenticated By: Osa Craver, M.D.   Ct Cervical Spine Wo Contrast  06/30/2011  *RADIOLOGY REPORT*  Clinical Data:  fall. LACERATION  CT HEAD WITHOUT CONTRAST CT CERVICAL SPINE WITHOUT CONTRAST  Technique:  Multidetector CT imaging of the head and cervical spine was performed following the standard protocol without IV contrast. Multiplanar CT image reconstructions of the cervical spine were also generated.  Comparison: None  CT HEAD  Findings: There is left occipital scalp soft tissue swelling. There is a nondisplaced left occipital bone fracture. Suspect a left tentorial subdural hematoma extending to the left middle cranial fossa at its medial aspect. Diffuse atrophy.  The lateral ventricles are dilated out of portion to the mildly prominent sulci.  There is no midline shift.  No focal parenchymal edema, mass, or mass effect.  IMPRESSION:  1.  Nondisplaced left occipital bone fracture. 2.  Probable small left tentorial subdural hematoma without significant mass effect. I telephoned the critical test results to Dr. Juleen China at the time of interpretation. 3. Possible normal pressure (communicating) hydrocephalus.  CT CERVICAL SPINE  Findings: Left occipital bone fracture is again evident. Normal cervical spine alignment.  There is narrowing of interspaces C2-3, C4-5, C5-6, C6-7 with endplate spurring R6-E4 posteriorly.  Facets seated with multilevel degenerative   hypertrophic changes.  No fracture.  Facet and uncovertebral disease result in right-sided foraminal encroachment C3-4 and C4-5.  There is no prevertebral soft tissue  swelling.  Visualized lung apices clear.  There is calcified plaque in the left carotid bifurcation.  IMPRESSION:  1.  Negative for fracture or other acute cervical spine abnormality. 2.  Multilevel degenerative changes as above. 3.  Left carotid bifurcation calcified plaque.  Original Report Authenticated By: Osa Craver, M.D.   Dg Chest Port 1 View  06/30/2011  *RADIOLOGY REPORT*  Clinical Data: Fall and rule out aspiration.  PORTABLE CHEST - 1 VIEW  Comparison: None.  Findings: Single view of the chest was obtained.  The aortic arch is prominent with calcifications.  Few linear densities  at the left lung base could represent atelectasis but an early aspiration cannot be excluded.  Upper lungs are clear.  Heart size is upper limits of normal.  IMPRESSION: Linear densities at the left lung base most likely represent atelectasis but early aspiration cannot be excluded.  Mild enlargement of the cardiac silhouette.  Original Report Authenticated By: Richarda Overlie, M.D.    Review of Systems  Constitutional: Negative.   HENT: Positive for hearing loss and ear pain.   Eyes: Negative.   Cardiovascular: Negative.   Gastrointestinal: Positive for nausea and vomiting.  Genitourinary: Negative.   Musculoskeletal: Positive for joint pain.  Skin: Negative.   Neurological: Positive for dizziness.   Blood pressure 155/77, pulse 60, temperature 97.6 F (36.4 C), temperature source Oral, resp. rate 12, SpO2 99.00%. Physical Exam  Constitutional: She is oriented to person, place, and time. She appears well-developed and well-nourished.  HENT:  Head: Normocephalic.  Eyes: Pupils are equal, round, and reactive to light.  Neck: Normal range of motion. Neck supple.  Cardiovascular: Normal rate.   GI: Soft.  Musculoskeletal: Normal range of motion.  Neurological: She is alert and oriented to person, place, and time. She has normal strength. GCS eye subscore is 4. GCS verbal subscore is 5. GCS motor  subscore is 6.  Reflex Scores:      Patellar reflexes are 0 on the right side and 0 on the left side.      Achilles reflexes are 0 on the right side and 0 on the left side.      Patient is awake alert and oriented x3 pupils are equal and reactive extraocular movements are intact. She has a scalp laceration in the posterior superior aspect of her occipital bone. Her neck is nontender strength is 5 of 5 in her upper and lower extremities with no pronator drift.     Assessment/Plan: 78 year old female closed head injury with a left occipital bone fracture actually appears to be tracking along the lambdoidal suture with a question of some tentorial blood although I do not think that this is definitive or significant at this point. She is on Coumadin with INR 2.0 I do agree with the ER doctors initiation of the reversal protocol. I would recommend an internal medicine evaluation and admission continue with reversal followup CT scan of her head in the morning if the scan is stable she can be discharged home with reinitiation of Coumadin in approximately 3 days. I discontinued her cervical collar.  Barrie Wale P 06/30/2011, 10:19 PM

## 2011-06-30 NOTE — Progress Notes (Signed)
MEDICATION RELATED CONSULT NOTE - INITIAL   Pharmacy Consult for Reversal of Anticoagulation Indication: Fall, subdural hematoma  Allergies  Allergen Reactions  . Demerol Nausea And Vomiting  . Tequin Nausea Only  . Penicillins Rash    Patient Measurements:   Adjusted Body Weight:   Vital Signs: Temp: 97.6 F (36.4 C) (02/25 2010) Temp src: Oral (02/25 2010) BP: 157/88 mmHg (02/25 2127) Pulse Rate: 55  (02/25 2127) Intake/Output from previous day:   Intake/Output from this shift:    Labs:  [facility] 06/30/11 2018  WBC 5.4  HGB 13.6  HCT 40.4  PLT 160  APTT --  CREATININE 0.89  LABCREA --  CREATININE 0.89  CREAT24HRUR --  MG --  PHOS --  ALBUMIN --  PROT --  ALBUMIN --  AST --  ALT --  ALKPHOS --  BILITOT --  BILIDIR --  IBILI --   CrCl is unknown because there is no height on file for the current visit.   Microbiology: No results found for this or any previous visit (from the past 720 hour(s)).  Medical History: No past medical history on file.  Medications:   (Not in a hospital admission)  Assessment: 78 yo F admitted after a fall with a therapeutic INR.  Patient noted with subdural hematoma.  Pharmacy consulted to start reversal protocol with FIEBA  Goal of Therapy:  INR <1.5  Plan:  1. FIEBA 500 units x 1 and Vitamin K 10 mg IV x 1 2. Follow up INR 2h after FIEBA infusion.  Lovenia Kim Pharm.D., BCPS Clinical Pharmacist 06/30/2011 10:04 PM Pager: [phone] Phone: [phone]    Shannon [name] 06/30/2011,10:02 PM

## 2011-07-01 ENCOUNTER — Encounter (HOSPITAL_COMMUNITY): Payer: Self-pay | Admitting: Emergency Medicine

## 2011-07-01 ENCOUNTER — Inpatient Hospital Stay (HOSPITAL_COMMUNITY): Payer: Medicare Other

## 2011-07-01 LAB — CBC
MCH: 30.9 pg (ref 26.0–34.0)
MCHC: 33.4 g/dL (ref 30.0–36.0)
MCV: 92.6 fL (ref 78.0–100.0)
Platelets: 156 10*3/uL (ref 150–400)

## 2011-07-01 LAB — COMPREHENSIVE METABOLIC PANEL
ALT: 23 U/L (ref 0–35)
AST: 24 U/L (ref 0–37)
Albumin: 3.6 g/dL (ref 3.5–5.2)
CO2: 26 mEq/L (ref 19–32)
Chloride: 101 mEq/L (ref 96–112)
GFR calc non Af Amer: 74 mL/min — ABNORMAL LOW (ref >90)
Potassium: 3.8 mEq/L (ref 3.5–5.1)
Sodium: 136 mEq/L (ref 135–145)
Total Bilirubin: 0.7 mg/dL (ref 0.3–1.2)

## 2011-07-01 LAB — PROTIME-INR
INR: 1.16 (ref 0.00–1.49)
INR: 1.17 (ref 0.00–1.49)
INR: 1.21 (ref 0.00–1.49)
INR: 1.21 (ref 0.00–1.49)
INR: 2.31 — ABNORMAL HIGH (ref 0.00–1.49)
Prothrombin Time: 15 seconds (ref 11.6–15.2)
Prothrombin Time: 15.1 seconds (ref 11.6–15.2)
Prothrombin Time: 15.3 seconds — ABNORMAL HIGH (ref 11.6–15.2)
Prothrombin Time: 15.6 seconds — ABNORMAL HIGH (ref 11.6–15.2)
Prothrombin Time: 25.8 seconds — ABNORMAL HIGH (ref 11.6–15.2)

## 2011-07-01 LAB — ABO/RH: ABO/RH(D): A POS

## 2011-07-01 LAB — URINE MICROSCOPIC-ADD ON

## 2011-07-01 LAB — URINALYSIS, ROUTINE W REFLEX MICROSCOPIC
Bilirubin Urine: NEGATIVE
Glucose, UA: NEGATIVE mg/dL
Hgb urine dipstick: NEGATIVE
Ketones, ur: 15 mg/dL — AB
Nitrite: POSITIVE — AB
Protein, ur: NEGATIVE mg/dL
Specific Gravity, Urine: 1.019 (ref 1.005–1.030)
Urobilinogen, UA: 0.2 mg/dL (ref 0.0–1.0)
pH: 6.5 (ref 5.0–8.0)

## 2011-07-01 LAB — TYPE AND SCREEN
ABO/RH(D): A POS
Antibody Screen: NEGATIVE

## 2011-07-01 MED ORDER — ATORVASTATIN CALCIUM 10 MG PO TABS
10.0000 mg | ORAL_TABLET | Freq: Every day | ORAL | Status: DC
  Administered 2011-07-01 – 2011-07-07 (×6): 10 mg via ORAL
  Filled 2011-07-01 (×8): qty 1

## 2011-07-01 MED ORDER — ACETAMINOPHEN 325 MG PO TABS
650.0000 mg | ORAL_TABLET | Freq: Four times a day (QID) | ORAL | Status: DC | PRN
  Administered 2011-07-01 – 2011-07-07 (×6): 650 mg via ORAL
  Filled 2011-07-01 (×7): qty 2

## 2011-07-01 MED ORDER — SODIUM CHLORIDE 0.9 % IJ SOLN
3.0000 mL | Freq: Two times a day (BID) | INTRAMUSCULAR | Status: DC
  Administered 2011-07-01 – 2011-07-05 (×6): 3 mL via INTRAVENOUS

## 2011-07-01 MED ORDER — ACETAMINOPHEN 650 MG RE SUPP: 650.0000 mg | Freq: Four times a day (QID) | RECTAL | Status: DC | PRN

## 2011-07-01 MED ORDER — NITROFURANTOIN MACROCRYSTAL 50 MG PO CAPS: 50.0000 mg | ORAL_CAPSULE | Freq: Every day | ORAL | Status: DC

## 2011-07-01 MED ORDER — SODIUM CHLORIDE 0.9 % IV SOLN
INTRAVENOUS | Status: DC
  Administered 2011-07-01: 09:00:00 via INTRAVENOUS
  Administered 2011-07-02: 75 mL/h via INTRAVENOUS
  Administered 2011-07-03 – 2011-07-04 (×2): via INTRAVENOUS

## 2011-07-01 MED ORDER — DILTIAZEM HCL ER 180 MG PO CP24
180.0000 mg | ORAL_CAPSULE | Freq: Every day | ORAL | Status: DC
  Administered 2011-07-01 – 2011-07-08 (×8): 180 mg via ORAL
  Filled 2011-07-01 (×8): qty 1

## 2011-07-01 MED ORDER — ANTIINHIBITOR COAGULANT CMPLX IV SOLR
500.0000 [IU] | INTRAVENOUS | Status: AC
  Administered 2011-07-01: 500 [IU] via INTRAVENOUS
  Filled 2011-07-01: qty 500

## 2011-07-01 MED ORDER — ONDANSETRON HCL 4 MG/2ML IJ SOLN
4.0000 mg | Freq: Four times a day (QID) | INTRAMUSCULAR | Status: DC | PRN
  Administered 2011-07-01 – 2011-07-02 (×2): 4 mg via INTRAVENOUS
  Filled 2011-07-01 (×2): qty 2

## 2011-07-01 MED ORDER — METOPROLOL TARTRATE 25 MG PO TABS
25.0000 mg | ORAL_TABLET | Freq: Two times a day (BID) | ORAL | Status: DC
  Administered 2011-07-01 – 2011-07-08 (×13): 25 mg via ORAL
  Filled 2011-07-01 (×16): qty 1

## 2011-07-01 MED ORDER — ONDANSETRON HCL 4 MG PO TABS
4.0000 mg | ORAL_TABLET | Freq: Four times a day (QID) | ORAL | Status: DC | PRN
  Administered 2011-07-01: 4 mg via ORAL
  Filled 2011-07-01: qty 1

## 2011-07-01 MED ORDER — PROPAFENONE HCL 150 MG PO TABS
150.0000 mg | ORAL_TABLET | Freq: Three times a day (TID) | ORAL | Status: DC
  Administered 2011-07-01 – 2011-07-08 (×17): 150 mg via ORAL
  Filled 2011-07-01 (×24): qty 1

## 2011-07-01 MED ORDER — ZOLPIDEM TARTRATE 5 MG PO TABS
5.0000 mg | ORAL_TABLET | Freq: Every day | ORAL | Status: DC
  Administered 2011-07-01 – 2011-07-07 (×7): 5 mg via ORAL
  Filled 2011-07-01 (×7): qty 1

## 2011-07-01 MED ORDER — DARIFENACIN HYDROBROMIDE ER 7.5 MG PO TB24
7.5000 mg | ORAL_TABLET | Freq: Every day | ORAL | Status: DC
  Administered 2011-07-01 – 2011-07-08 (×8): 7.5 mg via ORAL
  Filled 2011-07-01 (×8): qty 1

## 2011-07-01 NOTE — Progress Notes (Signed)
ANTICOAGULATION REVERSAL CONSULT NOTE - Follow Up    Allergies  Allergen Reactions  . Demerol Nausea And Vomiting  . Tequin Nausea Only  . Penicillins Rash    Vital Signs: Temp: 97.8 F (36.6 C) (02/26 0030) Temp src: Oral (02/26 0030) BP: 148/88 mmHg (02/26 0137) Pulse Rate: 65  (02/26 0137)  Labs:  Alvira Philips 06/30/11 2330 06/30/11 2018  HGB -- 13.6  HCT -- 40.4  PLT -- 160  APTT -- --  LABPROT 25.8* 25.1*  INR 2.31* 2.23*  HEPARINUNFRC -- --  CREATININE -- 0.89  CKTOTAL -- --  CKMB -- --  TROPONINI -- --   CrCl is unknown because there is no height on file for the current visit.  Assessment: 78 yo female with h/o Afib, s/p fall/[facility] for anticoagulation reversal.  PT received FEIBA 500 units and Vitamin K 10 mg IV and 10 pm.  Follow-up INR 2.23  Goal of Therapy:  INR < 1.3   Plan:  Repeat FEIBA 500 units as per protocol. Repeat INR at 4 am this morning.  Mayara Paulson, Gary Fleet 07/01/2011,1:51 AM

## 2011-07-01 NOTE — H&P (Signed)
Shannon [name] is an 78 y.o. female.   Chief Complaint: Fall HPI: 78 YO who had a mechanical fall at home here with confusion and bleeding from the back of her skull. Initial evaluation in the ER showed occipital fracture and some subdural hematoma with no mass effect. Neuro surgery and trauma both consulted and patient is to be admited for further work up and observation. She was apparently out for about 45 minutes after the incident. She is more lucid now but still not back to her baseline. No specific complaints.  Past Medical History  Diagnosis Date  . GERD (gastroesophageal reflux disease)   . Hypertension   . Hypothyroidism   . Cancer     Past Surgical History  Procedure Date  . Breast surgery     History reviewed. No pertinent family history. Social History:  does not have a smoking history on file. She does not have any smokeless tobacco history on file. She reports that she does not drink alcohol. Her drug history not on file.  Allergies:  Allergies  Allergen Reactions  . Demerol Nausea And Vomiting  . Tequin Nausea Only  . Penicillins Rash    Medications Prior to Admission  Medication Dose Route Frequency Provider Last Rate Last Dose  . acetaminophen (TYLENOL) tablet 650 mg  650 mg Oral Q6H PRN Lonia Blood, MD   650 mg at 07/01/11 0027   Or  . acetaminophen (TYLENOL) suppository 650 mg  650 mg Rectal Q6H PRN Lonia Blood, MD      . anti-inhibitor coagulant complex (FEIBA VH) injection 500 Units  500 Units Intravenous Once [address] [place]   500 Units at 06/30/11 2201  . anti-inhibitor coagulant complex (FEIBA VH) injection 500 Units  500 Units Intravenous STAT Raeford Razor, MD   500 Units at 07/01/11 0122  . ondansetron (ZOFRAN) injection 4 mg  4 mg Intravenous Once Mariam Dollar, MD   4 mg at 06/30/11 2212  . phytonadione (VITAMIN K) 10 mg in dextrose 5 % 50 mL IVPB  10 mg Intravenous Once Raeford Razor, MD   10 mg at 06/30/11 2220  . DISCONTD:  anti-inhibitor coagulant complex (FEIBA VH) injection 500 Units  500 Units Intravenous STAT Sheran Luz, MD      . DISCONTD: anti-inhibitor coagulant complex (FEIBA VH) injection 500 Units  500 Units Intravenous Once PRN Sheran Luz, MD      . DISCONTD: phytonadione (VITAMIN K) 10 mg in dextrose 5 % 50 mL IVPB  10 mg Intravenous STAT Sheran Luz, MD       No current outpatient prescriptions on file as of 06/30/2011.    Results for orders placed during the hospital encounter of 06/30/11 (from the past 48 hour(s))  CBC     Status: Normal   Collection Time   06/30/11  8:18 PM      Component Value Range Comment   WBC 5.4  4.0 - 10.5 (K/uL)    RBC 4.34  3.87 - 5.11 (MIL/uL)    Hemoglobin 13.6  12.0 - 15.0 (g/dL)    HCT 54.6  50.3 - 54.6 (%)    MCV 93.1  78.0 - 100.0 (fL)    MCH 31.3  26.0 - 34.0 (pg)    MCHC 33.7  30.0 - 36.0 (g/dL)    RDW 56.8  12.7 - 51.7 (%)    Platelets 160  150 - 400 (K/uL)   BASIC METABOLIC PANEL     Status: Abnormal  Collection Time   06/30/11  8:18 PM      Component Value Range Comment   Sodium 138  135 - 145 (mEq/L)    Potassium 4.1  3.5 - 5.1 (mEq/L)    Chloride 105  96 - 112 (mEq/L)    CO2 24  19 - 32 (mEq/L)    Glucose, Bld 94  70 - 99 (mg/dL)    BUN 17  6 - 23 (mg/dL)    Creatinine, Ser 1.61  0.50 - 1.10 (mg/dL)    Calcium 09.6  8.4 - 10.5 (mg/dL)    GFR calc non Af Amer 57 (*) >90 (mL/min)    GFR calc Af Amer 67 (*) >90 (mL/min)   PROTIME-INR     Status: Abnormal   Collection Time   06/30/11  8:18 PM      Component Value Range Comment   Prothrombin Time 25.1 (*) 11.6 - 15.2 (seconds)    INR 2.23 (*) 0.00 - 1.49    TYPE AND SCREEN     Status: Normal   Collection Time   06/30/11  9:55 PM      Component Value Range Comment   ABO/RH(D) A POS      Antibody Screen NEG      Sample Expiration 07/03/2011     ABO/RH     Status: Normal (Preliminary result)   Collection Time   06/30/11  9:55 PM      Component Value Range Comment   ABO/RH(D) A POS     PREPARE  FRESH FROZEN PLASMA     Status: Normal (Preliminary result)   Collection Time   06/30/11 10:00 PM      Component Value Range Comment   Unit Number [id number]      Blood Component Type THAWED PLASMA      Unit division 00      Status of Unit ISSUED      Transfusion Status OK TO TRANSFUSE      Unit Number [id number]      Blood Component Type THAWED PLASMA      Unit division 00      Status of Unit ALLOCATED      Transfusion Status OK TO TRANSFUSE     PROTIME-INR     Status: Abnormal   Collection Time   06/30/11 11:30 PM      Component Value Range Comment   Prothrombin Time 25.8 (*) 11.6 - 15.2 (seconds)    INR 2.31 (*) 0.00 - 1.49     Ct Head Wo Contrast  06/30/2011  *RADIOLOGY REPORT*  Clinical Data:  fall. LACERATION  CT HEAD WITHOUT CONTRAST CT CERVICAL SPINE WITHOUT CONTRAST  Technique:  Multidetector CT imaging of the head and cervical spine was performed following the standard protocol without IV contrast. Multiplanar CT image reconstructions of the cervical spine were also generated.  Comparison: None  CT HEAD  Findings: There is left occipital scalp soft tissue swelling. There is a nondisplaced left occipital bone fracture. Suspect a left tentorial subdural hematoma extending to the left middle cranial fossa at its medial aspect. Diffuse atrophy.  The lateral ventricles are dilated out of portion to the mildly prominent sulci.  There is no midline shift.  No focal parenchymal edema, mass, or mass effect.  IMPRESSION:  1.  Nondisplaced left occipital bone fracture. 2.  Probable small left tentorial subdural hematoma without significant mass effect. I telephoned the critical test results to Dr. Juleen China at the time of interpretation. 3. Possible normal pressure (  communicating) hydrocephalus.  CT CERVICAL SPINE  Findings: Left occipital bone fracture is again evident. Normal cervical spine alignment.  There is narrowing of interspaces C2-3, C4-5, C5-6, C6-7 with endplate spurring W0-J8 posteriorly.   Facets seated with multilevel degenerative   hypertrophic changes.  No fracture.  Facet and uncovertebral disease result in right-sided foraminal encroachment C3-4 and C4-5.  There is no prevertebral soft tissue swelling.  Visualized lung apices clear.  There is calcified plaque in the left carotid bifurcation.  IMPRESSION:  1.  Negative for fracture or other acute cervical spine abnormality. 2.  Multilevel degenerative changes as above. 3.  Left carotid bifurcation calcified plaque.  Original Report Authenticated By: Osa Craver, M.D.   Ct Cervical Spine Wo Contrast  06/30/2011  *RADIOLOGY REPORT*  Clinical Data:  fall. LACERATION  CT HEAD WITHOUT CONTRAST CT CERVICAL SPINE WITHOUT CONTRAST  Technique:  Multidetector CT imaging of the head and cervical spine was performed following the standard protocol without IV contrast. Multiplanar CT image reconstructions of the cervical spine were also generated.  Comparison: None  CT HEAD  Findings: There is left occipital scalp soft tissue swelling. There is a nondisplaced left occipital bone fracture. Suspect a left tentorial subdural hematoma extending to the left middle cranial fossa at its medial aspect. Diffuse atrophy.  The lateral ventricles are dilated out of portion to the mildly prominent sulci.  There is no midline shift.  No focal parenchymal edema, mass, or mass effect.  IMPRESSION:  1.  Nondisplaced left occipital bone fracture. 2.  Probable small left tentorial subdural hematoma without significant mass effect. I telephoned the critical test results to Dr. Juleen China at the time of interpretation. 3. Possible normal pressure (communicating) hydrocephalus.  CT CERVICAL SPINE  Findings: Left occipital bone fracture is again evident. Normal cervical spine alignment.  There is narrowing of interspaces C2-3, C4-5, C5-6, C6-7 with endplate spurring J1-B1 posteriorly.  Facets seated with multilevel degenerative   hypertrophic changes.  No fracture.  Facet  and uncovertebral disease result in right-sided foraminal encroachment C3-4 and C4-5.  There is no prevertebral soft tissue swelling.  Visualized lung apices clear.  There is calcified plaque in the left carotid bifurcation.  IMPRESSION:  1.  Negative for fracture or other acute cervical spine abnormality. 2.  Multilevel degenerative changes as above. 3.  Left carotid bifurcation calcified plaque.  Original Report Authenticated By: Osa Craver, M.D.   Dg Chest Port 1 View  06/30/2011  *RADIOLOGY REPORT*  Clinical Data: Fall and rule out aspiration.  PORTABLE CHEST - 1 VIEW  Comparison: None.  Findings: Single view of the chest was obtained.  The aortic arch is prominent with calcifications.  Few linear densities at the left lung base could represent atelectasis but an early aspiration cannot be excluded.  Upper lungs are clear.  Heart size is upper limits of normal.  IMPRESSION: Linear densities at the left lung base most likely represent atelectasis but early aspiration cannot be excluded.  Mild enlargement of the cardiac silhouette.  Original Report Authenticated By: Richarda Overlie, M.D.    Review of Systems  Musculoskeletal: Positive for falls.  Neurological: Positive for loss of consciousness and headaches.  All other systems reviewed and are negative.    Blood pressure 148/88, pulse 65, temperature 97.8 F (36.6 C), temperature source Oral, resp. rate 18, SpO2 98.00%. Physical Exam  Constitutional: She appears well-developed and well-nourished.  HENT:  Head: Head is with contusion.  Bleeding occiput  Eyes: Conjunctivae and EOM are normal. Pupils are equal, round, and reactive to light.  Neck: Normal range of motion. Neck supple.  Cardiovascular: Normal rate, regular rhythm, normal heart sounds and intact distal pulses.   Respiratory: Effort normal and breath sounds normal.  GI: Soft. Bowel sounds are normal.  Musculoskeletal: Normal range of motion.  Neurological: She has  normal reflexes. She is disoriented. She displays no tremor. No cranial nerve deficit or sensory deficit. She displays a negative Romberg sign. She displays no seizure activity.  Skin: Skin is warm and dry. Bruising and ecchymosis noted. She is not diaphoretic. No pallor.     Assessment/Plan 1. SDH: Will admit for observation to Neuro ICU. Frequent Neuro checks, reverse her INR, repeat Head CT in AM to see if there is any progression. Ns will follow with [place]. 2. Afib: will continue with rate control but reverse INR now with FFP 3. Skull fracture: Per NS/Trauma 4. S/P Fall: mechanical in nature. Will get PT/OT involved once stable Hypothyroidsm: continue home medications  Yoni Lobos,LAWAL 07/01/2011, 1:45 AM

## 2011-07-01 NOTE — ED Provider Notes (Signed)
I saw and evaluated the patient, reviewed the resident's note and I agree with the findings and plan.  Please see completed note for this encounter.  Raeford Razor, MD 07/01/11 [phone]

## 2011-07-01 NOTE — Progress Notes (Signed)
Subjective: Patient reports She's doing fairly well no headache no nausea no vomiting anymore  Objective: Vital signs in last 24 hours: Temp:  [97.5 F (36.4 C)-98.6 F (37 C)] 97.9 F (36.6 C) (02/26 0345) Pulse Rate:  [55-66] 65  (02/26 0400) Resp:  [12-22] 13  (02/26 0800) BP: (117-183)/(54-94) 117/54 mmHg (02/26 0800) SpO2:  [91 %-100 %] 96 % (02/26 0400) Weight:  [72.4 kg (159 lb 9.8 oz)] 72.4 kg (159 lb 9.8 oz) (02/26 0300)  Intake/Output from previous day: 02/25 0701 - 02/26 0700 In: 1005 [I.V.:650; Blood:355] Out: -  Intake/Output this shift:    Awake alert oriented x4 strength out of 5 but no pronator drift  Lab Results:  Basename 07/01/11 0345 06/30/11 2018  WBC 9.6 5.4  HGB 12.9 13.6  HCT 38.6 40.4  PLT 156 160   BMET  Basename 07/01/11 0345 06/30/11 2018  NA 136 138  K 3.8 4.1  CL 101 105  CO2 26 24  GLUCOSE 130* 94  BUN 14 17  CREATININE 0.77 0.89  CALCIUM 10.1 10.2    Studies/Results: Ct Head Wo Contrast  06/30/2011  *RADIOLOGY REPORT*  Clinical Data:  fall. LACERATION  CT HEAD WITHOUT CONTRAST CT CERVICAL SPINE WITHOUT CONTRAST  Technique:  Multidetector CT imaging of the head and cervical spine was performed following the standard protocol without IV contrast. Multiplanar CT image reconstructions of the cervical spine were also generated.  Comparison: None  CT HEAD  Findings: There is left occipital scalp soft tissue swelling. There is a nondisplaced left occipital bone fracture. Suspect a left tentorial subdural hematoma extending to the left middle cranial fossa at its medial aspect. Diffuse atrophy.  The lateral ventricles are dilated out of portion to the mildly prominent sulci.  There is no midline shift.  No focal parenchymal edema, mass, or mass effect.  IMPRESSION:  1.  Nondisplaced left occipital bone fracture. 2.  Probable small left tentorial subdural hematoma without significant mass effect. I telephoned the critical test results to Dr. Juleen China  at the time of interpretation. 3. Possible normal pressure (communicating) hydrocephalus.  CT CERVICAL SPINE  Findings: Left occipital bone fracture is again evident. Normal cervical spine alignment.  There is narrowing of interspaces C2-3, C4-5, C5-6, C6-7 with endplate spurring W0-J8 posteriorly.  Facets seated with multilevel degenerative   hypertrophic changes.  No fracture.  Facet and uncovertebral disease result in right-sided foraminal encroachment C3-4 and C4-5.  There is no prevertebral soft tissue swelling.  Visualized lung apices clear.  There is calcified plaque in the left carotid bifurcation.  IMPRESSION:  1.  Negative for fracture or other acute cervical spine abnormality. 2.  Multilevel degenerative changes as above. 3.  Left carotid bifurcation calcified plaque.  Original Report Authenticated By: Osa Craver, M.D.   Ct Cervical Spine Wo Contrast  06/30/2011  *RADIOLOGY REPORT*  Clinical Data:  fall. LACERATION  CT HEAD WITHOUT CONTRAST CT CERVICAL SPINE WITHOUT CONTRAST  Technique:  Multidetector CT imaging of the head and cervical spine was performed following the standard protocol without IV contrast. Multiplanar CT image reconstructions of the cervical spine were also generated.  Comparison: None  CT HEAD  Findings: There is left occipital scalp soft tissue swelling. There is a nondisplaced left occipital bone fracture. Suspect a left tentorial subdural hematoma extending to the left middle cranial fossa at its medial aspect. Diffuse atrophy.  The lateral ventricles are dilated out of portion to the mildly prominent sulci.  There is no  midline shift.  No focal parenchymal edema, mass, or mass effect.  IMPRESSION:  1.  Nondisplaced left occipital bone fracture. 2.  Probable small left tentorial subdural hematoma without significant mass effect. I telephoned the critical test results to Dr. Juleen China at the time of interpretation. 3. Possible normal pressure (communicating) hydrocephalus.   CT CERVICAL SPINE  Findings: Left occipital bone fracture is again evident. Normal cervical spine alignment.  There is narrowing of interspaces C2-3, C4-5, C5-6, C6-7 with endplate spurring A5-W0 posteriorly.  Facets seated with multilevel degenerative   hypertrophic changes.  No fracture.  Facet and uncovertebral disease result in right-sided foraminal encroachment C3-4 and C4-5.  There is no prevertebral soft tissue swelling.  Visualized lung apices clear.  There is calcified plaque in the left carotid bifurcation.  IMPRESSION:  1.  Negative for fracture or other acute cervical spine abnormality. 2.  Multilevel degenerative changes as above. 3.  Left carotid bifurcation calcified plaque.  Original Report Authenticated By: Osa Craver, M.D.   Dg Chest Port 1 View  06/30/2011  *RADIOLOGY REPORT*  Clinical Data: Fall and rule out aspiration.  PORTABLE CHEST - 1 VIEW  Comparison: None.  Findings: Single view of the chest was obtained.  The aortic arch is prominent with calcifications.  Few linear densities at the left lung base could represent atelectasis but an early aspiration cannot be excluded.  Upper lungs are clear.  Heart size is upper limits of normal.  IMPRESSION: Linear densities at the left lung base most likely represent atelectasis but early aspiration cannot be excluded.  Mild enlargement of the cardiac silhouette.  Original Report Authenticated By: Richarda Overlie, M.D.    Assessment/Plan: Await CT scan this morning if it's stable she can be transferred to the floor with possible discharge in her later today or tomorrow. Her INR is 1.2 last time it was checked with one pending this morning. I would recommend based on the results of her followup CT scan, if there is no significant amount of intracranial blood to keep her off her Coumadin for 3 days and then restarted her normal dose she should slowly trickled back to therapeutic levels. I would try to avoid any bolus or any heparin bridging.   LOS: 1 day     Emannuel Vise P 07/01/2011, 8:33 AM

## 2011-07-01 NOTE — ED Notes (Signed)
PHONE NUMBERS: Aerica Rincon (daughter-in-law) [phone] Tambria Pfannenstiel (grandson) [phone] Tristyn Pharris (son) [phone] & work [phone]

## 2011-07-01 NOTE — Progress Notes (Addendum)
This is an 79 y/o female who fell at home and became unresponsive. I am unable to tell from what the patient is telling me whether she tripped or whether she fainted. She seems to have a poor short term memory and tells me that her husband was present and may know.  Currently she has no complaints. She has been evaluated by Dr Wynetta Emery and the repeat CT results have been conveyed to him by Herbert Seta, the patient's nurse. He has told Herbert Seta that the patient is stable for transfer out of 3100. Please see his note about details regarding Coumadin.  The patient is unable to tell me why she is on Coumadin or which doctor placed her on it. I spoke with her husband later in the day and have discovered that she is on it for a-fib.Her cardiologists are [place].   He further tells me that she fell while trying to open the door to the [employer] entrance. She has been having some trouble with memory for a few weeks now. She is not off balance on a usual basis. She is quite  active. I have discussed with Mr Peeters that she will need to  hold the Coumadin until March 2nd at which point she can resume her usual dose. (please see Neuro note and recommendations.)  Pharmacy has calculated her Cr clearance to be 49. At this clearance, Macrodantin is contraindicated and therefore it has been discontinued.  UA is positive. I will order a Urine culture now.   Addendum: I have spoken with her husband again who has mentioned that her PCP is Dr Robley Fries (she has seen him once) and her Urologist is Dr Marcelyn Bruins. I will be calling Dr Kevan Ny' office to leave a message for him.   Calvert Cantor, MD [phone]

## 2011-07-02 LAB — BASIC METABOLIC PANEL
BUN: 13 mg/dL (ref 6–23)
CO2: 23 mEq/L (ref 19–32)
Calcium: 9.5 mg/dL (ref 8.4–10.5)
Chloride: 105 mEq/L (ref 96–112)
Creatinine, Ser: 0.63 mg/dL (ref 0.50–1.10)
GFR calc Af Amer: >90 mL/min (ref >90)
GFR calc non Af Amer: 80 mL/min — ABNORMAL LOW (ref >90)
Glucose, Bld: 101 mg/dL — ABNORMAL HIGH (ref 70–99)
Potassium: 3.6 mEq/L (ref 3.5–5.1)
Sodium: 137 mEq/L (ref 135–145)

## 2011-07-02 LAB — CBC
HCT: 34.9 % — ABNORMAL LOW (ref 36.0–46.0)
Hemoglobin: 11.8 g/dL — ABNORMAL LOW (ref 12.0–15.0)
MCH: 31.2 pg (ref 26.0–34.0)
MCHC: 33.8 g/dL (ref 30.0–36.0)
MCV: 92.3 fL (ref 78.0–100.0)
Platelets: 139 10*3/uL — ABNORMAL LOW (ref 150–400)
RBC: 3.78 MIL/uL — ABNORMAL LOW (ref 3.87–5.11)
RDW: 14.2 % (ref 11.5–15.5)
WBC: 7.4 10*3/uL (ref 4.0–10.5)

## 2011-07-02 LAB — PREPARE FRESH FROZEN PLASMA
Unit division: 0
Unit division: 0

## 2011-07-02 LAB — PROTIME-INR
INR: 1.12 (ref 0.00–1.49)
Prothrombin Time: 15.2 seconds (ref 11.6–15.2)
Prothrombin Time: 14.6 seconds (ref 11.6–15.2)

## 2011-07-02 MED ORDER — LEVOTHYROXINE SODIUM 125 MCG PO TABS
125.0000 ug | ORAL_TABLET | Freq: Every day | ORAL | Status: DC
  Administered 2011-07-03 – 2011-07-08 (×6): 125 ug via ORAL
  Filled 2011-07-02 (×9): qty 1

## 2011-07-02 MED ORDER — POLYETHYLENE GLYCOL 3350 17 G PO PACK
17.0000 g | PACK | Freq: Every day | ORAL | Status: DC | PRN
  Administered 2011-07-03: 17 g via ORAL
  Filled 2011-07-02 (×2): qty 1

## 2011-07-02 NOTE — Progress Notes (Signed)
Subjective: Patient reports Overall she's doing well today a limited dizziness when she is up virtually no headache no nausea no vomiting  Objective: Vital signs in last 24 hours: Temp:  [98 F (36.7 C)-99.8 F (37.7 C)] 98.5 F (36.9 C) (02/27 1447) Pulse Rate:  [58-70] 70  (02/27 1447) Resp:  [18-20] 20  (02/27 1447) BP: (118-144)/(55-77) 131/75 mmHg (02/27 1447) SpO2:  [92 %-97 %] 97 % (02/27 1447)  Intake/Output from previous day: 02/26 0701 - 02/27 0700 In: 1645 [I.V.:1643; IV Piggyback:2] Out: 450 [Urine:450] Intake/Output this shift:    Awake alert and oriented x4 cranial nerves are intact strength is 5 out of 5 but no pronator drift.  Lab Results:  Basename 07/02/11 0238 07/01/11 0345  WBC 7.4 9.6  HGB 11.8* 12.9  HCT 34.9* 38.6  PLT 139* 156   BMET  Basename 07/02/11 0238 07/01/11 0345  NA 137 136  K 3.6 3.8  CL 105 101  CO2 23 26  GLUCOSE 101* 130*  BUN 13 14  CREATININE 0.63 0.77  CALCIUM 9.5 10.1    Studies/Results: Ct Head Wo Contrast  07/01/2011  *RADIOLOGY REPORT*  Clinical Data: Fall.  Headache.  CT HEAD WITHOUT CONTRAST  Technique:  Contiguous axial images were obtained from the base of the skull through the vertex without contrast.  Comparison: 06/30/2011.  Findings: Long nondisplaced left occipital fracture once again noted.  Tentorial subdural hematoma without significant change.  New 1.1 cm focal blood collection anterior to the right petrous temporal bone with surrounding vasogenic edema.  This may represent extra-axial blood although it is possible this represents a temporal lobe hemorrhagic contusion.  The adjacent mastoid air cells appear clear without discrete fracture at this level noted.  Suggestion of small area of subarachnoid blood as versus artifact lateral anterior left temporal lobe (series 2 image 11).  Global atrophy.  Ventricular prominence raises possibility of superimposed mild hydrocephalus.  No CT evidence of acute large  thrombotic infarct or intracranial mass.  Vascular calcifications.  IMPRESSION: Long nondisplaced left occipital fracture once again noted.  Tentorial subdural hematoma without significant change.  New 1.1 cm focal blood collection anterior to the right petrous temporal bone with surrounding vasogenic edema.  This may represent extra-axial blood although it is possible this represents a temporal lobe hemorrhagic contusion.  Suggestion of small area of subarachnoid blood as versus artifact lateral anterior left temporal lobe (series 2 image 11).  Original Report Authenticated By: Fuller Canada, M.D.   Ct Head Wo Contrast  06/30/2011  *RADIOLOGY REPORT*  Clinical Data:  fall. LACERATION  CT HEAD WITHOUT CONTRAST CT CERVICAL SPINE WITHOUT CONTRAST  Technique:  Multidetector CT imaging of the head and cervical spine was performed following the standard protocol without IV contrast. Multiplanar CT image reconstructions of the cervical spine were also generated.  Comparison: None  CT HEAD  Findings: There is left occipital scalp soft tissue swelling. There is a nondisplaced left occipital bone fracture. Suspect a left tentorial subdural hematoma extending to the left middle cranial fossa at its medial aspect. Diffuse atrophy.  The lateral ventricles are dilated out of portion to the mildly prominent sulci.  There is no midline shift.  No focal parenchymal edema, mass, or mass effect.  IMPRESSION:  1.  Nondisplaced left occipital bone fracture. 2.  Probable small left tentorial subdural hematoma without significant mass effect. I telephoned the critical test results to Dr. Juleen China at the time of interpretation. 3. Possible normal pressure (communicating) hydrocephalus.  CT CERVICAL SPINE  Findings: Left occipital bone fracture is again evident. Normal cervical spine alignment.  There is narrowing of interspaces C2-3, C4-5, C5-6, C6-7 with endplate spurring N8-G9 posteriorly.  Facets seated with multilevel degenerative    hypertrophic changes.  No fracture.  Facet and uncovertebral disease result in right-sided foraminal encroachment C3-4 and C4-5.  There is no prevertebral soft tissue swelling.  Visualized lung apices clear.  There is calcified plaque in the left carotid bifurcation.  IMPRESSION:  1.  Negative for fracture or other acute cervical spine abnormality. 2.  Multilevel degenerative changes as above. 3.  Left carotid bifurcation calcified plaque.  Original Report Authenticated By: Osa Craver, M.D.   Ct Cervical Spine Wo Contrast  06/30/2011  *RADIOLOGY REPORT*  Clinical Data:  fall. LACERATION  CT HEAD WITHOUT CONTRAST CT CERVICAL SPINE WITHOUT CONTRAST  Technique:  Multidetector CT imaging of the head and cervical spine was performed following the standard protocol without IV contrast. Multiplanar CT image reconstructions of the cervical spine were also generated.  Comparison: None  CT HEAD  Findings: There is left occipital scalp soft tissue swelling. There is a nondisplaced left occipital bone fracture. Suspect a left tentorial subdural hematoma extending to the left middle cranial fossa at its medial aspect. Diffuse atrophy.  The lateral ventricles are dilated out of portion to the mildly prominent sulci.  There is no midline shift.  No focal parenchymal edema, mass, or mass effect.  IMPRESSION:  1.  Nondisplaced left occipital bone fracture. 2.  Probable small left tentorial subdural hematoma without significant mass effect. I telephoned the critical test results to Dr. Juleen China at the time of interpretation. 3. Possible normal pressure (communicating) hydrocephalus.  CT CERVICAL SPINE  Findings: Left occipital bone fracture is again evident. Normal cervical spine alignment.  There is narrowing of interspaces C2-3, C4-5, C5-6, C6-7 with endplate spurring F6-O1 posteriorly.  Facets seated with multilevel degenerative   hypertrophic changes.  No fracture.  Facet and uncovertebral disease result in right-sided  foraminal encroachment C3-4 and C4-5.  There is no prevertebral soft tissue swelling.  Visualized lung apices clear.  There is calcified plaque in the left carotid bifurcation.  IMPRESSION:  1.  Negative for fracture or other acute cervical spine abnormality. 2.  Multilevel degenerative changes as above. 3.  Left carotid bifurcation calcified plaque.  Original Report Authenticated By: Osa Craver, M.D.   Dg Chest Port 1 View  06/30/2011  *RADIOLOGY REPORT*  Clinical Data: Fall and rule out aspiration.  PORTABLE CHEST - 1 VIEW  Comparison: None.  Findings: Single view of the chest was obtained.  The aortic arch is prominent with calcifications.  Few linear densities at the left lung base could represent atelectasis but an early aspiration cannot be excluded.  Upper lungs are clear.  Heart size is upper limits of normal.  IMPRESSION: Linear densities at the left lung base most likely represent atelectasis but early aspiration cannot be excluded.  Mild enlargement of the cardiac silhouette.  Original Report Authenticated By: Richarda Overlie, M.D.    Assessment/Plan: She's making good progress CT yesterday showed a little bit of evolution of her right temporal contusion however no mass effect the patient is mobilizing well continue observation for another 24-48 hours repeat CT in the morning  LOS: 2 days     Sandeep Radell P 07/02/2011, 3:23 PM

## 2011-07-02 NOTE — Progress Notes (Signed)
Subjective: With some nausea and he vomited this morning but no double vision. She knows she is [facility] but could not remember my name. She has only met me one time a few weeks ago to establish my practice.  Objective: Weight change:   Intake/Output Summary (Last 24 hours) at 07/02/11 1028 Last data filed at 07/02/11 0630  Gross per 24 hour  Intake 1542.5 ml  Output    450 ml  Net 1092.5 ml   Filed Vitals:   07/01/11 2202 07/02/11 0200 07/02/11 0600 07/02/11 1015  BP: 118/68 128/75 144/65 131/77  Pulse: 64 62 61 67  Temp: 99.8 F (37.7 C) 99.7 F (37.6 C) 98.1 F (36.7 C) 98.3 F (36.8 C)  TempSrc: Oral Oral Oral Oral  Resp: 19 19 19 20   Height:      Weight:      SpO2: 93% 92% 93% 95%   General Appearance: Alert, cooperative, no distress, appears stated age Head: occipital hematoma/ecchymosis. Neck: Supple, symmetrical Lungs: Clear to auscultation bilaterally, respirations unlabored Heart: Regular rate and rhythm, S1 and S2 normal, no murmur, rub or gallop Abdomen: Soft, non-tender, bowel sounds active all four quadrants, no masses, no organomegaly Extremities: Extremities normal, atraumatic, no cyanosis or edema Pulses: 2+ and symmetric all extremities Skin: Skin color, texture, turgor normal, no rashes or lesions Neuro: CNII-XII intact. Normal strength, sensation and reflexes throughout   Lab Results:  [facility] 07/02/11 0238 07/01/11 0345  NA 137 136  K 3.6 3.8  CL 105 101  CO2 23 26  GLUCOSE 101* 130*  BUN 13 14  CREATININE 0.63 0.77  CALCIUM 9.5 10.1  MG -- --  PHOS -- --    Basename 07/01/11 0345  AST 24  ALT 23  ALKPHOS 66  BILITOT 0.7  PROT 6.6  ALBUMIN 3.6   No results found for this basename: LIPASE:2,AMYLASE:2 in the last 72 hours  Basename 07/02/11 0238 07/01/11 0345  WBC 7.4 9.6  NEUTROABS -- --  HGB 11.8* 12.9  HCT 34.9* 38.6  MCV 92.3 92.6  PLT 139* 156   No results found for this basename:  CKTOTAL:3,CKMB:3,CKMBINDEX:3,TROPONINI:3 in the last 72 hours No components found with this basename: POCBNP:3 No results found for this basename: DDIMER:2 in the last 72 hours No results found for this basename: HGBA1C:2 in the last 72 hours No results found for this basename: CHOL:2,HDL:2,LDLCALC:2,TRIG:2,CHOLHDL:2,LDLDIRECT:2 in the last 72 hours No results found for this basename: TSH,T4TOTAL,FREET3,T3FREE,THYROIDAB in the last 72 hours No results found for this basename: VITAMINB12:2,FOLATE:2,FERRITIN:2,TIBC:2,IRON:2,RETICCTPCT:2 in the last 72 hours  Studies/Results: Ct Head Wo Contrast  07/01/2011  *RADIOLOGY REPORT*  Clinical Data: Fall.  Headache.  CT HEAD WITHOUT CONTRAST  Technique:  Contiguous axial images were obtained from the base of the skull through the vertex without contrast.  Comparison: 06/30/2011.  Findings: Long nondisplaced left occipital fracture once again noted.  Tentorial subdural hematoma without significant change.  New 1.1 cm focal blood collection anterior to the right petrous temporal bone with surrounding vasogenic edema.  This may represent extra-axial blood although it is possible this represents a temporal lobe hemorrhagic contusion.  The adjacent mastoid air cells appear clear without discrete fracture at this level noted.  Suggestion of small area of subarachnoid blood as versus artifact lateral anterior left temporal lobe (series 2 image 11).  Global atrophy.  Ventricular prominence raises possibility of superimposed mild hydrocephalus.  No CT evidence of acute large thrombotic infarct or intracranial mass.  Vascular calcifications.  IMPRESSION: Long nondisplaced  left occipital fracture once again noted.  Tentorial subdural hematoma without significant change.  New 1.1 cm focal blood collection anterior to the right petrous temporal bone with surrounding vasogenic edema.  This may represent extra-axial blood although it is possible this represents a temporal lobe  hemorrhagic contusion.  Suggestion of small area of subarachnoid blood as versus artifact lateral anterior left temporal lobe (series 2 image 11).  Original Report Authenticated By: Fuller Canada, M.D.   Ct Head Wo Contrast  06/30/2011  *RADIOLOGY REPORT*  Clinical Data:  fall. LACERATION  CT HEAD WITHOUT CONTRAST CT CERVICAL SPINE WITHOUT CONTRAST  Technique:  Multidetector CT imaging of the head and cervical spine was performed following the standard protocol without IV contrast. Multiplanar CT image reconstructions of the cervical spine were also generated.  Comparison: None  CT HEAD  Findings: There is left occipital scalp soft tissue swelling. There is a nondisplaced left occipital bone fracture. Suspect a left tentorial subdural hematoma extending to the left middle cranial fossa at its medial aspect. Diffuse atrophy.  The lateral ventricles are dilated out of portion to the mildly prominent sulci.  There is no midline shift.  No focal parenchymal edema, mass, or mass effect.  IMPRESSION:  1.  Nondisplaced left occipital bone fracture. 2.  Probable small left tentorial subdural hematoma without significant mass effect. I telephoned the critical test results to Dr. Juleen China at the time of interpretation. 3. Possible normal pressure (communicating) hydrocephalus.  CT CERVICAL SPINE  Findings: Left occipital bone fracture is again evident. Normal cervical spine alignment.  There is narrowing of interspaces C2-3, C4-5, C5-6, C6-7 with endplate spurring Z6-X0 posteriorly.  Facets seated with multilevel degenerative   hypertrophic changes.  No fracture.  Facet and uncovertebral disease result in right-sided foraminal encroachment C3-4 and C4-5.  There is no prevertebral soft tissue swelling.  Visualized lung apices clear.  There is calcified plaque in the left carotid bifurcation.  IMPRESSION:  1.  Negative for fracture or other acute cervical spine abnormality. 2.  Multilevel degenerative changes as above. 3.   Left carotid bifurcation calcified plaque.  Original Report Authenticated By: Osa Craver, M.D.   Ct Cervical Spine Wo Contrast  06/30/2011  *RADIOLOGY REPORT*  Clinical Data:  fall. LACERATION  CT HEAD WITHOUT CONTRAST CT CERVICAL SPINE WITHOUT CONTRAST  Technique:  Multidetector CT imaging of the head and cervical spine was performed following the standard protocol without IV contrast. Multiplanar CT image reconstructions of the cervical spine were also generated.  Comparison: None  CT HEAD  Findings: There is left occipital scalp soft tissue swelling. There is a nondisplaced left occipital bone fracture. Suspect a left tentorial subdural hematoma extending to the left middle cranial fossa at its medial aspect. Diffuse atrophy.  The lateral ventricles are dilated out of portion to the mildly prominent sulci.  There is no midline shift.  No focal parenchymal edema, mass, or mass effect.  IMPRESSION:  1.  Nondisplaced left occipital bone fracture. 2.  Probable small left tentorial subdural hematoma without significant mass effect. I telephoned the critical test results to Dr. Juleen China at the time of interpretation. 3. Possible normal pressure (communicating) hydrocephalus.  CT CERVICAL SPINE  Findings: Left occipital bone fracture is again evident. Normal cervical spine alignment.  There is narrowing of interspaces C2-3, C4-5, C5-6, C6-7 with endplate spurring R6-E4 posteriorly.  Facets seated with multilevel degenerative   hypertrophic changes.  No fracture.  Facet and uncovertebral disease result in right-sided foraminal encroachment  C3-4 and C4-5.  There is no prevertebral soft tissue swelling.  Visualized lung apices clear.  There is calcified plaque in the left carotid bifurcation.  IMPRESSION:  1.  Negative for fracture or other acute cervical spine abnormality. 2.  Multilevel degenerative changes as above. 3.  Left carotid bifurcation calcified plaque.  Original Report Authenticated By: Osa Craver, M.D.   Dg Chest Port 1 View  06/30/2011  *RADIOLOGY REPORT*  Clinical Data: Fall and rule out aspiration.  PORTABLE CHEST - 1 VIEW  Comparison: None.  Findings: Single view of the chest was obtained.  The aortic arch is prominent with calcifications.  Few linear densities at the left lung base could represent atelectasis but an early aspiration cannot be excluded.  Upper lungs are clear.  Heart size is upper limits of normal.  IMPRESSION: Linear densities at the left lung base most likely represent atelectasis but early aspiration cannot be excluded.  Mild enlargement of the cardiac silhouette.  Original Report Authenticated By: Richarda Overlie, M.D.   Medications: Scheduled Meds:   . atorvastatin  10 mg Oral q1800  . darifenacin  7.5 mg Oral Daily  . diltiazem  180 mg Oral Daily  . levothyroxine  125 mcg Oral QAC breakfast  . metoprolol  25 mg Oral BID  . propafenone  150 mg Oral TID  . sodium chloride  3 mL Intravenous Q12H  . zolpidem  5 mg Oral QHS  . DISCONTD: nitrofurantoin  50 mg Oral Daily   Continuous Infusions:   . sodium chloride 75 mL/hr at 07/01/11 1500   PRN Meds:.acetaminophen, acetaminophen, ondansetron (ZOFRAN) IV, ondansetron  Assessment/Plan:  Patient Active Problem List  Diagnoses Date Noted  . SDH (subdural hematoma) - as per neurology/neurosurgery 06/30/2011  . Occipital fracture - as per neurology/neurosurgery 06/30/2011  . Fall 06/30/2011  . A-fib - continue off Coumadin for now 06/30/2011  . Warfarin-induced coagulopathy 06/30/2011  . Hypothyroidism - resume Synthroid Needs PT OT 06/30/2011     LOS: 2 days   Shannon [name] 07/02/2011, 10:28 AM

## 2011-07-02 NOTE — Progress Notes (Signed)
Patient continues to have minimal amount of drainage from the wound site. Pt was having nausea whenever she moves her head late evening yesterday. But didn't complained any this morning. Patient remained completely alert and oriented throughout the night with some short term memory impairment.

## 2011-07-02 NOTE — Progress Notes (Signed)
07/02/2011 Shannon [name] Case Management Note [phone]  Utilization review completed.  

## 2011-07-02 NOTE — Evaluation (Signed)
Physical Therapy Evaluation Patient Details Name: Shannon [name] MRN: [id number] DOB: Jan 19, 1925 Today's Date: 07/02/2011  Problem List:  Patient Active Problem List  Diagnoses  . SDH (subdural hematoma)  . Occipital fracture  . Fall  . A-fib  . Warfarin-induced coagulopathy  . Hypothyroidism    Past Medical History:  Past Medical History  Diagnosis Date  . GERD (gastroesophageal reflux disease)   . Hypertension   . Hypothyroidism   . Cancer    Past Surgical History:  Past Surgical History  Procedure Date  . Breast surgery     PT Assessment/Plan/Recommendation PT Assessment Clinical Impression Statement: Pt s/p fall with resulting occipital fx and SDH with resulting dizziness, decreased balance, decreased memory, safety awareness, and problem solving. Pt will need a rolling walker at D/C and 24 hour supervision. Attempted gait with rollator walker as she was concerned RW would make her "look old" however pt unable to safely operate rollator walker, rollator would also be too difficult for husband to load into car. Decision made to use RW at this time. Discussed CIR/SNF with her - pt prefers to go home with HHPT at this time.  PT Recommendation/Assessment: Patient will need skilled PT in the acute care venue PT Problem List: Decreased activity tolerance;Decreased balance;Decreased mobility;Decreased knowledge of use of DME;Decreased safety awareness;Decreased cognition PT Therapy Diagnosis : Difficulty walking;Abnormality of gait;Generalized weakness PT Plan PT Frequency: Min 4X/week PT Treatment/Interventions: DME instruction;Gait training;Stair training;Functional mobility training;Therapeutic activities;Therapeutic exercise;Balance training;Patient/family education;Cognitive remediation PT Recommendation Follow Up Recommendations: Home health PT;Supervision/Assistance - 24 hour Equipment Recommended: Tub/shower seat;Rolling walker with 5" wheels PT Goals  Acute Rehab PT  Goals PT Goal Formulation: With patient Time For Goal Achievement: 2 weeks Pt will go Supine/Side to Sit: with modified independence PT Goal: Supine/Side to Sit - Progress: Goal set today Pt will go Sit to Supine/Side: with modified independence PT Goal: Sit to Supine/Side - Progress: Goal set today Pt will go Sit to Stand: with modified independence PT Goal: Sit to Stand - Progress: Goal set today Pt will go Stand to Sit: with modified independence PT Goal: Stand to Sit - Progress: Goal set today Pt will Ambulate: >150 feet;with modified independence;with rolling walker PT Goal: Ambulate - Progress: Goal set today Pt will Go Up / Down Stairs: 3-5 stairs;with rail(s);with supervision PT Goal: Up/Down Stairs - Progress: Goal set today Additional Goals Additional Goal #1: Score >/= 46/56 to indicate decreased risk for falls. PT Goal: Additional Goal #1 - Progress: Goal set today  PT Evaluation Precautions/Restrictions  Precautions Precautions: Fall Precaution Comments: Pt fell on cement and hit head.  Amnestic of the event. Required Braces or Orthoses: No Restrictions Weight Bearing Restrictions: No Prior Functioning  Home Living Lives With: Spouse Receives Help From: Family Type of Home: House Home Layout: Two level;Bed/bath upstairs;Other (Comment) (is a full bath on main floor but bedroom upstairs) Alternate Level Stairs-Rails:  (has a lift to the second floor.) Alternate Level Stairs-Number of Steps: has a lift to the second floor. Home Access: Stairs to enter Entrance Stairs-Rails: Right Entrance Stairs-Number of Steps: 3 Bathroom Shower/Tub: Walk-in shower;Tub/shower unit;Curtain (walk in upstairs and tub shower on main level.) Bathroom Toilet: Standard Bathroom Accessibility: Yes Home Adaptive Equipment: None Prior Function Level of Independence: Independent with basic ADLs;Independent with homemaking with ambulation;Independent with gait;Independent with  transfers Driving: Yes Vocation: Retired Financial risk analyst Arousal/Alertness: Awake/alert Overall Cognitive Status: Impaired Memory: Appears impaired Memory Deficits: Only memory deficits noted were immediate memory and some STM.  Pt fairly accurate in recalling date, time, location, home situation etc.  Pt with very little recall of things talked about in the room at the moment.  Orientation Level: Oriented X4 Executive Functioning: Pt with diminished executive functioning, decreased problem solving as well. Cognition - Other Comments: Concern for pt's safety in that her immediate memory is poor.  Feel she would be at risk for a fall thinking she can get up and walk/drive on her own.  Pt will need 24/7 S initially. Sensation/Coordination Sensation Light Touch: Appears Intact (bil. LEs) Coordination Gross Motor Movements are Fluid and Coordinated: Yes Fine Motor Movements are Fluid and Coordinated: Yes Extremity Assessment RLE Assessment RLE Assessment: Within Functional Limits (for age) LLE Assessment LLE Assessment: Within Functional Limits (for age) Mobility (including Balance) Bed Mobility Bed Mobility: Yes Rolling Left: 5: Supervision Rolling Left Details (indicate cue type and reason): Cues to initiate, cues for efficiency.  Left Sidelying to Sit: 5: Supervision;HOB flat Left Sidelying to Sit Details (indicate cue type and reason): Cues to initiate - pt asking twice "now what do you want me to do now?" Sitting - Scoot to Edge of Bed: 5: Supervision Sitting - Scoot to Edge of Bed Details (indicate cue type and reason): Verbal cues again to initiate.  Transfers Transfers: Yes Sit to Stand: 4: Min assist;With upper extremity assist;From bed;With armrests;5: Supervision;Other (comment) (from rollator) Sit to Stand Details (indicate cue type and reason): Min assist once in standing without RW in front in standing, supervision with assistive device in standing. Repeated cues for  locking brakes on rollator walker prior to standing.  Stand to Sit: 4: Min assist;To chair/3-in-1;With armrests;Other (comment) Stand to Sit Details: Decreased control of descent. Cues to lock brakes on rollator prior to sitting. Pt unable to perform without cuing Ambulation/Gait Ambulation/Gait: Yes Ambulation/Gait Assistance: 4: Min assist;5: Supervision Ambulation/Gait Assistance Details (indicate cue type and reason): Min assist without device  (very unstable without device with wide BOS, shuffle gait, and pt reaching for UE support), supervision with RW and rollator. Max verbal cues for safe use of rollator (brakes, positioning, negotiation), min verbal cues for negotiation with RW. Pt prefers RW at this time. Verbal cues for upright posture.  Ambulation Distance (Feet): 120 Feet Assistive device: Rolling walker;4-wheeled walker;None Gait Pattern: Shuffle;Step-through pattern;Decreased stride length (wide base of support, decreased foot clearance bilaterally)  Posture/Postural Control Posture/Postural Control: No significant limitations Balance Balance Assessed: Yes Static Standing Balance Static Standing - Balance Support: No upper extremity supported Static Standing - Level of Assistance: 4: Min assist Static Standing - Comment/# of Minutes: Pt unable to bring feet fully to [place]om[place] position, unable to close eyes with normal stance without assistance to keep from falling.  End of Session PT - End of Session Equipment Utilized During Treatment: Gait belt Activity Tolerance: Patient limited by fatigue;Patient tolerated treatment well Patient left: in chair;with call bell in reach;with family/visitor present Nurse Communication: Mobility status for transfers;Mobility status for ambulation General Behavior During Session: [facility] for tasks performed Cognition: Impaired Cognitive Impairment: Pt demonstrating decreased immediate and STM, and mild unawareness of deficits.Pt with difficulty  with high level processing and problem solving during session  Sherie Don 07/02/2011, 5:02 PM  Sherie Don) Carleene Mains PT, DPT Acute Rehabilitation [phone]

## 2011-07-02 NOTE — Progress Notes (Signed)
   CARE MANAGEMENT NOTE 07/02/2011  Patient:  Shannon [name], Shannon [name]   Account Number:  [id number]  Date Initiated:  07/02/2011  Documentation initiated by:  Junius Creamer  Subjective/Objective Assessment:   adm w sudural hematoma     Action/Plan:   lives w husband of >56yrs, indep pta   Anticipated DC Date:  07/05/2011   Anticipated DC Plan:  HOME W HOME HEALTH SERVICES      DC Planning Services  CM consult      Choice offered to / List presented to:             Status of service:   Medicare Important Message given?   (If response is "NO", the following Medicare IM given date fields will be blank) Date Medicare IM given:   Date Additional Medicare IM given:    Discharge Disposition:    Per UR Regulation:    Comments:  2/27 spoke w pt, pcp is dr Elson Areas gates, left hhc list, not sure of dc needs, await pt/ot rec, will cont to follow. debbie Kylil Swopes rn,bsn [phone]

## 2011-07-02 NOTE — Evaluation (Signed)
Occupational Therapy Evaluation Patient Details Name: Shannon [name] MRN: [id number] DOB: 1925-04-14 Today's Date: 07/02/2011  Problem List:  Patient Active Problem List  Diagnoses  . SDH (subdural hematoma)  . Occipital fracture  . Fall  . A-fib  . Warfarin-induced coagulopathy  . Hypothyroidism    Past Medical History:  Past Medical History  Diagnosis Date  . GERD (gastroesophageal reflux disease)   . Hypertension   . Hypothyroidism   . Cancer    Past Surgical History:  Past Surgical History  Procedure Date  . Breast surgery     OT Assessment/Plan/Recommendation OT Assessment Clinical Impression Statement: Pt s/p fall with resulting occipital fx and SDH with deficits in areas of pain, nausea, dizziness, decreased balance, decreased memory and safety awareness affecting I and safety with basic adls.  Pt would benefit from cont OT to increase I with basic adls and  IADS so pt can safely d/c home with husband. OT Recommendation/Assessment: Patient will need skilled OT in the acute care venue OT Problem List: Decreased activity tolerance;Impaired balance (sitting and/or standing);Decreased cognition;Decreased safety awareness;Pain Barriers to Discharge: None OT Therapy Diagnosis : Generalized weakness;Cognitive deficits OT Plan OT Frequency: Min 2X/week OT Treatment/Interventions: Self-care/ADL training;Cognitive remediation/compensation;Therapeutic activities OT Recommendation Recommendations for Other Services: PT consult;Speech consult;Other (comment) (ST to look at [facility] strategies.) Follow Up Recommendations: Home health OT;Supervision/Assistance - 24 hour;Other (comment) (if able, could transition to OPOT if memory def. cont.) Equipment Recommended: Tub/shower seat Individuals Consulted Consulted and Agree with Results and Recommendations: Patient OT Goals Acute Rehab OT Goals OT Goal Formulation: With patient Time For Goal Achievement: 2 weeks ADL Goals Pt Will  Perform Grooming: with supervision;Standing at sink ADL Goal: Grooming - Progress: Goal set today Pt Will Perform Lower Body Bathing: with supervision;Sitting in shower;Sit to stand in shower ADL Goal: Lower Body Bathing - Progress: Goal set today Pt Will Perform Lower Body Dressing: with supervision;Sit to stand from chair;Other (comment) (gathering own clothes from closet.) ADL Goal: Lower Body Dressing - Progress: Goal set today Pt Will Perform Tub/Shower Transfer: Shower transfer;with supervision;Shower seat with back ADL Goal: Web designer - Progress: Goal set today Additional ADL Goal #1: PT will complete all aspects of toileting on regular commode next to sink with S. ADL Goal: Additional Goal #1 - Progress: Goal set today Miscellaneous OT Goals Miscellaneous OT Goal #1: Pt will be instructed to keep memory book at bedside to write down MD/therapy/nursing information to assist in overcoming her immediate memory deficits. OT Goal: Miscellaneous Goal #1 - Progress: Goal set today  OT Evaluation Precautions/Restrictions  Precautions Precautions: Fall Precaution Comments: Pt fell on cement and hit head.  Amnestic of the event. Required Braces or Orthoses: No Restrictions Weight Bearing Restrictions: No Prior Functioning Home Living Lives With: Spouse Receives Help From: Family Type of Home: House Home Layout: Two level;Bed/bath upstairs;Other (Comment) (is a full bath on main floor but bedroom upstairs) Alternate Level Stairs-Rails:  (has a lift to the second floor.) Home Access: Stairs to enter Bathroom Shower/Tub: Walk-in shower;Tub/shower unit;Curtain (walk in upstairs and tub shower on main level.) Bathroom Toilet: Standard Bathroom Accessibility: Yes Home Adaptive Equipment: None Prior Function Level of Independence: Independent with basic ADLs;Independent with homemaking with ambulation;Independent with gait;Independent with transfers Able to Take Stairs?:  Yes Driving: Yes Vocation: Retired Marine scientist Requirements: 4-5th Psychiatrist. ADL ADL Eating/Feeding: Performed;Set up Eating/Feeding Details (indicate cue type and reason): no appetite.  Very nauseaus. Where Assessed - Eating/Feeding: Chair Grooming: Performed;Wash/dry hands;Wash/dry face;Brushing hair;Set  up Where Assessed - Grooming: Sitting, chair Upper Body Bathing: Simulated;Set up Where Assessed - Upper Body Bathing: Sitting, chair Lower Body Bathing: Simulated;Minimal assistance Lower Body Bathing Details (indicate cue type and reason): min assist in standing.  Pt anxious on feet.  Pt becomes dizzy with standing and impulsive to get to chair. Where Assessed - Lower Body Bathing: Sit to stand from chair Upper Body Dressing: Simulated;Set up Where Assessed - Upper Body Dressing: Sitting, chair Lower Body Dressing: Performed;Minimal assistance Lower Body Dressing Details (indicate cue type and reason): pt gets dizzy in standing.  ASsist to pull pants up. Where Assessed - Lower Body Dressing: Sit to stand from chair Toilet Transfer: Performed;Minimal assistance Toilet Transfer Details (indicate cue type and reason): impulsive in standing. Toilet Transfer Method: Surveyor, minerals: Set designer - Clothing Manipulation: Simulated;Minimal assistance Toileting - Clothing Manipulation Details (indicate cue type and reason): pt unable to let go of pt to manage own clothes. Where Assessed - Toileting Clothing Manipulation: Standing Toileting - Hygiene: Performed;Supervision/safety Where Assessed - Toileting Hygiene: Sit on 3-in-1 or toilet Tub/Shower Transfer: Not assessed Tub/Shower Transfer Method: Not assessed Ambulation Related to ADLs: Pt just took a few steps to chair only,  Impulsive secondary to dizziness but did well. ADL Comments: Pt doing well with adls.  Pt's biggest limitation is dizziness and drowsiness. Vision/Perception  Vision  - History Baseline Vision: Wears glasses only for reading Patient Visual Report: Eye fatigue/eye pain/headache;Nausea/blurring vision with head movement Vision - Assessment Eye Alignment: Within Functional Limits Vision Assessment: Vision tested Ocular Range of Motion: Within Functional Limits Tracking/Visual Pursuits: Able to track stimulus in all quads without difficulty Visual Fields: No apparent deficits Diplopia Assessment: Other (comment) (No diplopia.  Pt with difficulty keeping eyes open.) Cognition Cognition Arousal/Alertness: Awake/alert Overall Cognitive Status: Impaired Memory: Appears impaired Memory Deficits: Only memory deficits noted were immediate memory and some STM.  Pt fairly accurate in recalling date, time, location, home situation etc.  Pt with very little recall of things talked about in the room at the moment. Orientation Level: Oriented X4 Executive Functioning: Feel pt may have some difficulty with high level executive functioning tasks due to her poor immediate memory.   Cognition - Other Comments: Concern for pt's safety in that her immediate memory is poor.  Feel she would be at risk for a fall thinking she can get up and walk/drive on her own.  Pt will need 24/7 S initially. Sensation/Coordination Sensation Light Touch: Appears Intact Coordination Gross Motor Movements are Fluid and Coordinated: Yes Fine Motor Movements are Fluid and Coordinated: Yes Extremity Assessment RUE Assessment RUE Assessment: Within Functional Limits LUE Assessment LUE Assessment: Within Functional Limits Mobility  Bed Mobility Bed Mobility: Yes Rolling Right: 5: Supervision;With rail Rolling Right Details (indicate cue type and reason): went very slowly to avoid dizziness. Supine to Sit: 4: Min assist;HOB flat;With rails Supine to Sit Details (indicate cue type and reason): min assist only b/c moving very slowly to avoid dizziness. Sitting - Scoot to Edge of Bed: 5:  Supervision Transfers Transfers: Yes Sit to Stand: 4: Min assist;With upper extremity assist;From bed;With armrests Sit to Stand Details (indicate cue type and reason): Pt in a hurry once up to get to chair and therefore b/c mildly impulsive. Stand to Sit: 4: Min assist;To chair/3-in-1;With armrests;Other (comment) (cues to control descent.) Exercises   End of Session OT - End of Session Activity Tolerance: Patient tolerated treatment well Patient left: in chair;with call bell in  reach Nurse Communication: Mobility status for transfers General Behavior During Session: [facility] for tasks performed Cognition: Impaired Cognitive Impairment: Pt demonstrating mild decreased safety awareness, decreased immediate and STM, and mild unawareness of deficits.   Hope Budds [phone] 07/02/2011, 12:17 PM

## 2011-07-03 ENCOUNTER — Inpatient Hospital Stay (HOSPITAL_COMMUNITY): Payer: Medicare Other

## 2011-07-03 ENCOUNTER — Encounter (HOSPITAL_COMMUNITY): Payer: Self-pay | Admitting: Radiology

## 2011-07-03 LAB — URINE CULTURE: Culture  Setup Time: 201302270841

## 2011-07-03 LAB — PROTIME-INR: Prothrombin Time: 15.4 seconds — ABNORMAL HIGH (ref 11.6–15.2)

## 2011-07-03 NOTE — Progress Notes (Signed)
Subjective: Conversive, mild confusion, alert, moving about the bed well  Objective: Weight change:   Intake/Output Summary (Last 24 hours) at 07/03/11 1425 Last data filed at 07/03/11 0600  Gross per 24 hour  Intake   1800 ml  Output      0 ml  Net   1800 ml   Filed Vitals:   07/02/11 2113 07/03/11 0500 07/03/11 0700 07/03/11 1038  BP: 157/76 148/74 155/80 155/81  Pulse: 60 64 56 56  Temp: 98.3 F (36.8 C) 98.6 F (37 C) 98.5 F (36.9 C) 98.5 F (36.9 C)  TempSrc: Oral Oral Oral Oral  Resp: 18 18 19 18   Height:      Weight:      SpO2: 95% 94% 93% 92%    General Appearance: Alert, cooperative, no distress, appears stated age  Head: occipital hematoma/ecchymosis.  Neck: Supple, symmetrical  Lungs: Clear to auscultation bilaterally, respirations unlabored  Heart: Regular rate and rhythm, S1 and S2 normal, no murmur, rub or gallop  Abdomen: Soft, non-tender, bowel sounds active all four quadrants, no masses, no organomegaly  Extremities: Extremities normal, atraumatic, no cyanosis or edema  Pulses: 2+ and symmetric all extremities  Skin: Skin color, texture, turgor normal, no rashes or lesions  Neuro: CNII-XII intact. Normal strength, sensation and reflexes throughout  Lab Results:  [facility] 07/02/11 0238 07/01/11 0345  NA 137 136  K 3.6 3.8  CL 105 101  CO2 23 26  GLUCOSE 101* 130*  BUN 13 14  CREATININE 0.63 0.77  CALCIUM 9.5 10.1  MG -- --  PHOS -- --    Basename 07/01/11 0345  AST 24  ALT 23  ALKPHOS 66  BILITOT 0.7  PROT 6.6  ALBUMIN 3.6   No results found for this basename: LIPASE:2,AMYLASE:2 in the last 72 hours  Basename 07/02/11 0238 07/01/11 0345  WBC 7.4 9.6  NEUTROABS -- --  HGB 11.8* 12.9  HCT 34.9* 38.6  MCV 92.3 92.6  PLT 139* 156   No results found for this basename: CKTOTAL:3,CKMB:3,CKMBINDEX:3,TROPONINI:3 in the last 72 hours No components found with this basename: POCBNP:3 No results found for this basename: DDIMER:2 in the  last 72 hours No results found for this basename: HGBA1C:2 in the last 72 hours No results found for this basename: CHOL:2,HDL:2,LDLCALC:2,TRIG:2,CHOLHDL:2,LDLDIRECT:2 in the last 72 hours No results found for this basename: TSH,T4TOTAL,FREET3,T3FREE,THYROIDAB in the last 72 hours No results found for this basename: VITAMINB12:2,FOLATE:2,FERRITIN:2,TIBC:2,IRON:2,RETICCTPCT:2 in the last 72 hours  Studies/Results: Ct Head Wo Contrast  07/03/2011  *RADIOLOGY REPORT*  Clinical Data: Followup intracranial hemorrhage.  CT HEAD WITHOUT CONTRAST  Technique:  Contiguous axial images were obtained from the base of the skull through the vertex without contrast.  Comparison: 07/01/2011.  Findings: Nondisplaced left occipital fracture once again noted.  Interval development of the low density bilateral subdural hematomas (measuring 3 mm on the left and 1.9 mm on the right).  Decrease conspicuity of tentorial subdural hematoma.  1.1 cm focal hematoma anterior aspect of the right petrous temporal bone with surrounding temporal lobe vasogenic edema unchanged.  Global atrophy.  Ventricular prominence may be related to atrophy. Difficult to completely exclude a mild component hydrocephalus. Appearance is without change.  Partially empty sella.  IMPRESSION: Interval development of the low density bilateral subdural hematomas (measuring 3 mm on the left and 1.9 mm on the right).  Decrease conspicuity of tentorial subdural hematoma.  1.1 cm focal hematoma anterior aspect of the right petrous temporal bone with surrounding temporal lobe vasogenic  edema unchanged.  Please see above.  Original Report Authenticated By: Fuller Canada, M.D.   Medications: Scheduled Meds:   . atorvastatin  10 mg Oral q1800  . darifenacin  7.5 mg Oral Daily  . diltiazem  180 mg Oral Daily  . levothyroxine  125 mcg Oral QAC breakfast  . metoprolol  25 mg Oral BID  . propafenone  150 mg Oral TID  . sodium chloride  3 mL Intravenous Q12H  .  zolpidem  5 mg Oral QHS   Continuous Infusions:   . sodium chloride 75 mL/hr at 07/03/11 0129   PRN Meds:.acetaminophen, acetaminophen, ondansetron (ZOFRAN) IV, ondansetron, polyethylene glycol  Assessment/Plan:  Patient Active Problem List  Diagnoses  Date Noted  .  SDH (subdural hematoma) - as per neurology/neurosurgery  06/30/2011  .  Occipital fracture - as per neurology/neurosurgery  06/30/2011  .  Fall  06/30/2011  .  A-fib - continue off Coumadin for now  06/30/2011  .  Warfarin-induced coagulopathy  06/30/2011   . Hypothyroidism -  Synthroid resumed   Needs PT OT  06/30/2011  Disp: Likely home in am with home health: nursing and PT/OT - Will consult [facility]   LOS: 3 days   Shannon [name] 07/03/2011, 2:25 PM

## 2011-07-03 NOTE — Progress Notes (Signed)
Physical Therapy Treatment Patient Details Name: Shannon [name] MRN: [id number] DOB: 1924/06/07 Today's Date: 07/03/2011  PT Assessment/Plan  PT - Assessment/Plan Comments on Treatment Session: Patient progressing well. Patient fatiques quickly. Pt with multiple lines and having slight difficulty following 2 step commands. Will attempt BERG next session if patient able to follow commands better than today.  PT Goals  Acute Rehab PT Goals PT Goal: Supine/Side to Sit - Progress: Progressing toward goal PT Goal: Sit to Stand - Progress: Progressing toward goal PT Goal: Stand to Sit - Progress: Progressing toward goal PT Goal: Ambulate - Progress: Progressing toward goal  PT Treatment Precautions/Restrictions  Precautions Precautions: Fall Precaution Comments: Pt fell on cement and hit head.  Amnestic of the event. Required Braces or Orthoses: No Restrictions Weight Bearing Restrictions: No Mobility (including Balance) Bed Mobility Rolling Left: 6: Modified independent (Device/Increase time) Left Sidelying to Sit: 6: Modified independent (Device/Increase time);With rails Transfers Sit to Stand: 4: Min assist;From bed;With upper extremity assist Sit to Stand Details (indicate cue type and reason): A to ensure balance. Cues for safe hand placement.  Stand to Sit: 4: Min assist;To bed;With upper extremity assist;With armrests Stand to Sit Details: A to control descent into recliner and cues for safe positioning/hand placement Ambulation/Gait Ambulation/Gait Assistance: 4: Min assist Ambulation/Gait Assistance Details (indicate cue type and reason): A for safety with RW management. Cues for posture and positioning inside of RW especially with turns as patient tends to turn with feet outside of RW Ambulation Distance (Feet): 140 Feet Assistive device: Rolling walker Gait Pattern: Trunk flexed;Decreased stride length;Step-through pattern Gait velocity: decreased    Exercise  General  Exercises - Lower Extremity Long Arc Quad: AROM;Both;Seated;15 reps Hip Flexion/Marching: Seated;Other reps (comment);Both;AROM (15) End of Session PT - End of Session Equipment Utilized During Treatment: Gait belt Activity Tolerance: Patient limited by fatigue Patient left: in chair Nurse Communication: Mobility status for transfers;Mobility status for ambulation General Behavior During Session: [facility] for tasks performed Cognition: Impaired Cognitive Impairment: Difficulty following 2 step commands today. Decreased focus on task  Fredrich Birks 07/03/2011, 11:37 AM 07/03/2011 Fredrich Birks PTA [phone] pager [phone] office

## 2011-07-03 NOTE — Progress Notes (Signed)
Pt c/o mild constipation. LBM 2/25. States she takes miralax at home. Called MD for miralax order.

## 2011-07-03 NOTE — Progress Notes (Signed)
Received order for [facility], will see pt in am and arrange for d/c needs.  Johny Shock RN MPH Case Manager [phone]

## 2011-07-03 NOTE — Progress Notes (Signed)
Physical medicine and rehabilitation consult requested. Patient status post subdural hematoma after a fall. Followup cranial CT scan pending this a.m.Marland Kitchen Physical therapy evaluation completed February 27 as patient is supervision level 120 feet with rolling walker. Request of both physical and occupational therapy the patient to be discharged to home with home health therapies and supervision of her husband. Patient requests to be discharged to home if at all possible. Patient was able to answer all questions appropriately for me this morning and she was also able to get out of bed into a wheelchair on her own. Will hold on formal rehabilitation consult at this time with recommendations for home therapies as per physical and occupational therapy. Please reconsult with this plan was to change

## 2011-07-03 NOTE — Progress Notes (Signed)
Subjective: Patient reports 2 in the same and no significant complaints of headache or nausea or vomiting his dizziness when she endplates.  Objective: Vital signs in last 24 hours: Temp:  [98.3 F (36.8 C)-98.6 F (37 C)] 98.5 F (36.9 C) (02/28 0700) Pulse Rate:  [56-71] 56  (02/28 0700) Resp:  [18-20] 19  (02/28 0700) BP: (129-157)/(55-80) 155/80 mmHg (02/28 0700) SpO2:  [93 %-98 %] 93 % (02/28 0700)  Intake/Output from previous day: 02/27 0701 - 02/28 0700 In: 1800 [I.V.:1800] Out: -  Intake/Output this shift:    Neurologically stable no focal motor deficits.  Lab Results:  Basename 07/02/11 0238 07/01/11 0345  WBC 7.4 9.6  HGB 11.8* 12.9  HCT 34.9* 38.6  PLT 139* 156   BMET  Basename 07/02/11 0238 07/01/11 0345  NA 137 136  K 3.6 3.8  CL 105 101  CO2 23 26  GLUCOSE 101* 130*  BUN 13 14  CREATININE 0.63 0.77  CALCIUM 9.5 10.1    Studies/Results: Ct Head Wo Contrast  07/03/2011  *RADIOLOGY REPORT*  Clinical Data: Followup intracranial hemorrhage.  CT HEAD WITHOUT CONTRAST  Technique:  Contiguous axial images were obtained from the base of the skull through the vertex without contrast.  Comparison: 07/01/2011.  Findings: Nondisplaced left occipital fracture once again noted.  Interval development of the low density bilateral subdural hematomas (measuring 3 mm on the left and 1.9 mm on the right).  Decrease conspicuity of tentorial subdural hematoma.  1.1 cm focal hematoma anterior aspect of the right petrous temporal bone with surrounding temporal lobe vasogenic edema unchanged.  Global atrophy.  Ventricular prominence may be related to atrophy. Difficult to completely exclude a mild component hydrocephalus. Appearance is without change.  Partially empty sella.  IMPRESSION: Interval development of the low density bilateral subdural hematomas (measuring 3 mm on the left and 1.9 mm on the right).  Decrease conspicuity of tentorial subdural hematoma.  1.1 cm focal  hematoma anterior aspect of the right petrous temporal bone with surrounding temporal lobe vasogenic edema unchanged.  Please see above.  Original Report Authenticated By: Fuller Canada, M.D.   Ct Head Wo Contrast  07/01/2011  *RADIOLOGY REPORT*  Clinical Data: Fall.  Headache.  CT HEAD WITHOUT CONTRAST  Technique:  Contiguous axial images were obtained from the base of the skull through the vertex without contrast.  Comparison: 06/30/2011.  Findings: Long nondisplaced left occipital fracture once again noted.  Tentorial subdural hematoma without significant change.  New 1.1 cm focal blood collection anterior to the right petrous temporal bone with surrounding vasogenic edema.  This may represent extra-axial blood although it is possible this represents a temporal lobe hemorrhagic contusion.  The adjacent mastoid air cells appear clear without discrete fracture at this level noted.  Suggestion of small area of subarachnoid blood as versus artifact lateral anterior left temporal lobe (series 2 image 11).  Global atrophy.  Ventricular prominence raises possibility of superimposed mild hydrocephalus.  No CT evidence of acute large thrombotic infarct or intracranial mass.  Vascular calcifications.  IMPRESSION: Long nondisplaced left occipital fracture once again noted.  Tentorial subdural hematoma without significant change.  New 1.1 cm focal blood collection anterior to the right petrous temporal bone with surrounding vasogenic edema.  This may represent extra-axial blood although it is possible this represents a temporal lobe hemorrhagic contusion.  Suggestion of small area of subarachnoid blood as versus artifact lateral anterior left temporal lobe (series 2 image 11).  Original Report Authenticated By: Viviann Spare  R. Constance Goltz, M.D.    Assessment/Plan: Post injury day 3 with stable neurologic exam CT scan does show development of small subdural hygromas lesion or causing any mass effect and I would recommend a  repeat CT of her head in one week provided she is medically cleared he would be okay with me for her to be discharged home scheduled followup approximately one week with a followup CT scan. I would recommend staying off of her Coumadin and not anticoagulate her until we confirm that these subdurals are resolving or stable.  LOS: 3 days     Bosco Paparella P 07/03/2011, 9:13 AM

## 2011-07-04 ENCOUNTER — Emergency Department (HOSPITAL_COMMUNITY): Payer: Medicare Other

## 2011-07-04 DIAGNOSIS — N390 Urinary tract infection, site not specified: Secondary | ICD-10-CM | POA: Diagnosis not present

## 2011-07-04 LAB — PRO B NATRIURETIC PEPTIDE: Pro B Natriuretic peptide (BNP): 2217 pg/mL — ABNORMAL HIGH (ref 0–450)

## 2011-07-04 MED ORDER — ALPRAZOLAM 0.25 MG PO TABS: 0.2500 mg | ORAL_TABLET | Freq: Three times a day (TID) | ORAL | Status: DC | PRN

## 2011-07-04 MED ORDER — LEVALBUTEROL HCL 0.63 MG/3ML IN NEBU
0.6300 mg | INHALATION_SOLUTION | Freq: Once | RESPIRATORY_TRACT | Status: AC
  Administered 2011-07-04: 0.63 mg via RESPIRATORY_TRACT
  Filled 2011-07-04: qty 3

## 2011-07-04 MED ORDER — DEXTROSE 5 % IV SOLN
1.0000 g | Freq: Once | INTRAVENOUS | Status: AC
  Administered 2011-07-04: 1 g via INTRAVENOUS
  Filled 2011-07-04: qty 10

## 2011-07-04 MED ORDER — FUROSEMIDE 10 MG/ML IJ SOLN
40.0000 mg | Freq: Once | INTRAMUSCULAR | Status: AC
  Administered 2011-07-04: 40 mg via INTRAVENOUS
  Filled 2011-07-04: qty 4

## 2011-07-04 MED ORDER — LOSARTAN POTASSIUM 50 MG PO TABS
50.0000 mg | ORAL_TABLET | Freq: Every day | ORAL | Status: DC
  Administered 2011-07-04 – 2011-07-08 (×5): 50 mg via ORAL
  Filled 2011-07-04 (×5): qty 1

## 2011-07-04 MED ORDER — CEPHALEXIN 500 MG PO CAPS
500.0000 mg | ORAL_CAPSULE | Freq: Three times a day (TID) | ORAL | Status: DC
  Administered 2011-07-05 – 2011-07-08 (×9): 500 mg via ORAL
  Filled 2011-07-04 (×13): qty 1

## 2011-07-04 NOTE — Progress Notes (Addendum)
Subjective: Shannon [name] had a difficult night. She pulled her IV out as well as seem to be more confused. Urine culture has grown Escherichia coli and we will treat. This certainly could be contributing to confusion. Long discussion with family yesterday and have decided that she is too tenuous for return to home and her husband cannot care for her 24 hours a day as she needs currently.  Will seek skilled nursing facility placement because of continued increased confusion likely secondary to concussion and temporal lobe hematoma with edema  Objective: Weight change:  No intake or output data in the 24 hours ending 07/04/11 0812  Filed Vitals:   07/03/11 1400 07/03/11 2200 07/04/11 0200 07/04/11 0600  BP: 153/79 174/85 162/77 176/84  Pulse: 60 61 57 60  Temp: 98.2 F (36.8 C) 98.7 F (37.1 C) 98.6 F (37 C) 98.6 F (37 C)  TempSrc: Oral     Resp: 18 18 18 18   Height:      Weight:      SpO2: 94% 92% 90% 91%   General Appearance: Alert, moderately confused Head: Normocephalic, ecchymosis along the occiput and posterior neck Neck: Supple, symmetrical Lungs: Clear to auscultation bilaterally, respirations unlabored Heart: Regular rate and rhythm, S1 and S2 normal, no murmur, rub or gallop Abdomen: Soft, non-tender, bowel sounds active all four quadrants, no masses, no organomegaly Extremities: Extremities normal, atraumatic, no cyanosis or edema Pulses: 2+ and symmetric all extremities Skin: Skin color, texture, turgor normal, no rashes or lesions Neuro: CNII-XII intact. Normal strength, sensation, moves all extremities well. Mild confusion, sundowning last night  Lab Results:  Basename 07/02/11 0238  NA 137  K 3.6  CL 105  CO2 23  GLUCOSE 101*  BUN 13  CREATININE 0.63  CALCIUM 9.5  MG --  PHOS --   No results found for this basename: AST:2,ALT:2,ALKPHOS:2,BILITOT:2,PROT:2,ALBUMIN:2 in the last 72 hours No results found for this basename: LIPASE:2,AMYLASE:2 in the last 72  hours  Basename 07/02/11 0238  WBC 7.4  NEUTROABS --  HGB 11.8*  HCT 34.9*  MCV 92.3  PLT 139*   No results found for this basename: CKTOTAL:3,CKMB:3,CKMBINDEX:3,TROPONINI:3 in the last 72 hours No components found with this basename: POCBNP:3 No results found for this basename: DDIMER:2 in the last 72 hours No results found for this basename: HGBA1C:2 in the last 72 hours No results found for this basename: CHOL:2,HDL:2,LDLCALC:2,TRIG:2,CHOLHDL:2,LDLDIRECT:2 in the last 72 hours No results found for this basename: TSH,T4TOTAL,FREET3,T3FREE,THYROIDAB in the last 72 hours No results found for this basename: VITAMINB12:2,FOLATE:2,FERRITIN:2,TIBC:2,IRON:2,RETICCTPCT:2 in the last 72 hours  Studies/Results: Ct Head Wo Contrast  07/03/2011  *RADIOLOGY REPORT*  Clinical Data: Followup intracranial hemorrhage.  CT HEAD WITHOUT CONTRAST  Technique:  Contiguous axial images were obtained from the base of the skull through the vertex without contrast.  Comparison: 07/01/2011.  Findings: Nondisplaced left occipital fracture once again noted.  Interval development of the low density bilateral subdural hematomas (measuring 3 mm on the left and 1.9 mm on the right).  Decrease conspicuity of tentorial subdural hematoma.  1.1 cm focal hematoma anterior aspect of the right petrous temporal bone with surrounding temporal lobe vasogenic edema unchanged.  Global atrophy.  Ventricular prominence may be related to atrophy. Difficult to completely exclude a mild component hydrocephalus. Appearance is without change.  Partially empty sella.  IMPRESSION: Interval development of the low density bilateral subdural hematomas (measuring 3 mm on the left and 1.9 mm on the right).  Decrease conspicuity of tentorial subdural hematoma.  1.1  cm focal hematoma anterior aspect of the right petrous temporal bone with surrounding temporal lobe vasogenic edema unchanged.  Please see above.  Original Report Authenticated By: Fuller Canada, M.D.   Medications: Scheduled Meds:   . atorvastatin  10 mg Oral q1800  . cefTRIAXone (ROCEPHIN)  IV  1 g Intravenous Once  . cephALEXin  500 mg Oral Q8H  . darifenacin  7.5 mg Oral Daily  . diltiazem  180 mg Oral Daily  . levothyroxine  125 mcg Oral QAC breakfast  . losartan  50 mg Oral Daily  . metoprolol  25 mg Oral BID  . propafenone  150 mg Oral TID  . sodium chloride  3 mL Intravenous Q12H  . zolpidem  5 mg Oral QHS   Continuous Infusions:   . sodium chloride 75 mL/hr at 07/03/11 0129   PRN Meds:.acetaminophen, acetaminophen, ALPRAZolam, ondansetron (ZOFRAN) IV, ondansetron, polyethylene glycol  Assessment/Plan:  Patient Active Problem List  Diagnoses  Date Noted  .  SDH (subdural hematoma) - as per neurology/neurosurgery. Has temporal lobe hematoma with vasogenic edema as well as subdural hematoma. Will need repeat CT scan in one week per Dr. Cheree Ditto 06/30/2011 Occipital fracture - as per neurology/neurosurgery  06/30/2011  Fall  06/30/2011  A-fib - continue off Coumadin for now  06/30/2011  Warfarin-induced coagulopathy  06/30/2011  Hypothyroidism - Synthroid resumed Escherichia coli UTI  -  Sensitive to cephalosporins. Treat with Rocephin x1 g bid Keflex for one week - could be contributing to confusion Needs PT OT  06/30/2011  Disp: I have decided on skilled nursing facility placement with rehabilitation since she is still confused  Patient Active Problem List  Diagnoses Date Noted  . UTI (urinary tract infection) 07/04/2011  . SDH (subdural hematoma) 06/30/2011  . Occipital fracture 06/30/2011  . Fall 06/30/2011  . A-fib 06/30/2011  . Warfarin-induced coagulopathy 06/30/2011  . Hypothyroidism 06/30/2011     LOS: 4 days   Shannon [name] 07/04/2011, 8:12 AM

## 2011-07-04 NOTE — Progress Notes (Signed)
   CARE MANAGEMENT NOTE 07/04/2011  Patient:  Shannon [name], Shannon [name]   Account Number:  [id number]  Date Initiated:  07/02/2011  Documentation initiated by:  Junius Creamer  Subjective/Objective Assessment:   adm w sudural hematoma     Action/Plan:   lives w husband of >82yrs, indep pta   Anticipated DC Date:  07/07/2011   Anticipated DC Plan:  SKILLED NURSING FACILITY  In-house referral  Clinical Social Worker      DC Planning Services  CM consult      Choice offered to / List presented to:             Status of service:   Medicare Important Message given?   (If response is "NO", the following Medicare IM given date fields will be blank) Date Medicare IM given:   Date Additional Medicare IM given:    Discharge Disposition:    Per UR Regulation:    Comments:  07/04/11 Enola Siebers,RN,BSN 1400 CM REFERRAL FOR HOME HEALTH NEEDS.  PER MD PROGRESS NOTES, PT CONFUSED AND WILL NEED SNF PLACEMENT.  BED SEARCH IN PROGRESS.  WILL DEFER TO CSW. Phone #[phone]  2/27 spoke w pt, pcp is dr Elson Areas gates, left hhc list, not sure of dc needs, await pt/ot rec, will cont to follow. debbie dowell rn,bsn [phone]

## 2011-07-04 NOTE — Progress Notes (Signed)
Patient presented with shortness of breath and expiratory wheezes. Dr. Kevan Ny notified at ordered STAT CXR and BNP and low dose Xopenex. Will Continue to monitor.

## 2011-07-04 NOTE — Progress Notes (Addendum)
Clinical Social Worker completed the psychosocial assessment which can be found in the shadow chart.  Patient currently lives at home with her husband.   Patient agreeable to skilled nursing facility search in [facility] - CSW to initiate search and follow up with patient and patient family to provide possible bed offers.  Patient states, she is in agreement for SNF search for options but she is not making the commitment to actually go to a SNF until she is able to discuss the situation with family and friends.  Clinical Social Worker eased patient concerns and reiterated that the search was not an agreement to go to SNF at discharge.  Per consult, patient experiencing confusion and mild dementia - CSW left message with patient husband to confirm patient disposition plans.  CSW spoke with patient husband once he arrived at hospital.  Patient husband is hopeful for SNF placement with patient for 2-3 weeks and then have her return home.  Patient husband preference is for [facility], [address], and [facility].   Clinical Social Worker will continue to follow for support and to facilitate patient discharge needs once medically ready.  [address] [place], [place] [phone]

## 2011-07-04 NOTE — Progress Notes (Signed)
Physical Therapy Treatment Patient Details Name: Shannon [name] MRN: [id number] DOB: 1925/04/28 Today's Date: 07/04/2011  PT Assessment/Plan  PT - Assessment/Plan Comments on Treatment Session: Completed Berg balance test, score 19/56. Educated pt on findings and importance of useing RW at all times. Pt unwilling to participate further in tx due to fatigue. Discussed ST SNF, and pt willing to consider.  PT Plan: Discharge plan needs to be updated;Frequency needs to be updated PT Frequency: Min 3X/week Follow Up Recommendations: Skilled nursing facility;Supervision/Assistance - 24 hour Equipment Recommended: Defer to next venue PT Goals  Acute Rehab PT Goals PT Goal: Supine/Side to Sit - Progress: Progressing toward goal PT Goal: Sit to Supine/Side - Progress: Progressing toward goal PT Goal: Sit to Stand - Progress: Progressing toward goal PT Goal: Stand to Sit - Progress: Progressing toward goal PT Goal: Ambulate - Progress: Progressing toward goal Additional Goals PT Goal: Additional Goal #1 - Progress: Progressing toward goal  PT Treatment Precautions/Restrictions  Precautions Precautions: Fall Precaution Comments: Pt fell on cement and hit head.  Amnestic of the event. Required Braces or Orthoses: No Restrictions Weight Bearing Restrictions: No Mobility (including Balance) Bed Mobility Supine to Sit: 5: Supervision;With rails;HOB flat Supine to Sit Details (indicate cue type and reason): Cues for initiation, increased time required Sitting - Scoot to Edge of Bed: 5: Supervision Sitting - Scoot to Edge of Bed Details (indicate cue type and reason): Cues to initiate  Transfers Sit to Stand: 4: Min assist;From bed;With upper extremity assist;From toilet Sit to Stand Details (indicate cue type and reason): Cues for scooting to edge Stand to Sit: 4: Min assist;With upper extremity assist;To bed;To toilet Stand to Sit Details: Cues for safe hand placement to control  descent Ambulation/Gait Ambulation/Gait: Yes Ambulation/Gait Assistance: 4: Min assist Ambulation/Gait Assistance Details (indicate cue type and reason): Pt: "Well, you can just walk me. I don't need that walker." Pt was educated on results of [name] and the need for RW at all times. Cues for posture and keeping RW with pt during turns and transfers Ambulation Distance (Feet): 12 Feet (x2, Refused longer gait training in hall) Assistive device: Rolling walker Gait Pattern: Trunk flexed;Decreased stride length;Step-through pattern Gait velocity: decreased  Posture/Postural Control Posture/Postural Control: No significant limitations Berg Balance Test Sit to Stand: Able to stand using hands after several tries Standing Unsupported: Able to stand 30 seconds unsupported Sitting with Back Unsupported but Feet Supported on Floor or Stool: Able to sit safely and securely 2 minutes Stand to Sit: Uses backs of legs against chair to control descent Transfers: Able to transfer with verbal cueing and /or supervision Standing Unsupported with Eyes Closed: Able to stand 10 seconds with supervision Standing Ubsupported with Feet Together: Needs help to attain position and unable to hold for 15 seconds From Standing, Reach Forward with Outstretched Arm: Can reach forward >5 cm safely (2") From Standing Position, Pick up Object from Floor: Unable to try/needs assist to keep balance From Standing Position, Turn to Look Behind Over each Shoulder: Turn sideways only but maintains balance Turn 360 Degrees: Needs assistance while turning Standing Unsupported, Alternately Place Feet on Step/Stool: Needs assistance to keep from falling or unable to try Standing Unsupported, One Foot in Front: Loses balance while stepping or standing Standing on One Leg: Unable to try or needs assist to prevent fall Total Score: 19  End of Session PT - End of Session Equipment Utilized During Treatment: Gait belt Activity  Tolerance: Patient limited by fatigue Patient left: in  bed;with call bell in reach (Refused to be up in chair) Nurse Communication: Mobility status for transfers;Mobility status for ambulation General Behavior During Session: [facility] for tasks performed Cognition: Impaired Cognitive Impairment: Difficulty following instructions with cues, slow processing and problem solving, decreased awareness of deficits  Iona Coach, [place] [phone]  07/04/2011, 2:41 PM

## 2011-07-05 NOTE — Progress Notes (Signed)
Subjective: Doing well w/o complaint. No n/v blurred vision, appetite ok  Objective: Vital signs in last 24 hours: Temp:  [97.5 F (36.4 C)-99 F (37.2 C)] 97.8 F (36.6 C) (03/02 1007) Pulse Rate:  [58-66] 58  (03/02 1007) Resp:  [18-20] 20  (03/02 1007) BP: (120-190)/(60-87) 151/60 mmHg (03/02 1007) SpO2:  [91 %-94 %] 93 % (03/02 1007) Weight change:  Last BM Date: 06/30/11  Intake/Output from previous day: 03/01 0701 - 03/02 0700 In: 720 [P.O.:720] Out: -  Intake/Output this shift: Total I/O In: 240 [P.O.:240] Out: -   General appearance: alert and cooperative Resp: clear to auscultation bilaterally Cardio: regular rate and rhythm, S1, S2 normal, no murmur, click, rub or gallop Neurologic: Grossly normal  Lab Results:  No results found for this or any previous visit (from the past 24 hour(s)).    Studies/Results: Dg Chest Port 1 View  07/04/2011  *RADIOLOGY REPORT*  Clinical Data: Short of breath.  Wheezing.  PORTABLE CHEST - 1 VIEW  Comparison: 06/30/2011  Findings: Heart is mildly enlarged.  Bibasilar atelectasis. Vascular congestion without interstitial edema.  Tiny right pleural effusion. No pneumothorax.  IMPRESSION: Cardiomegaly and vascular congestion without pulmonary edema.  Tiny right effusion is noted as well.  Original Report Authenticated By: Donavan Burnet, M.D.    Medications:  Prior to Admission:  Prescriptions prior to admission  Medication Sig Dispense Refill  . atorvastatin (LIPITOR) 10 MG tablet Take 10 mg by mouth daily.      Marland Kitchen diltiazem (DILACOR XR) 180 MG 24 hr capsule Take 180 mg by mouth daily.      . metoprolol (LOPRESSOR) 50 MG tablet Take 25 mg by mouth 2 (two) times daily.      . nitrofurantoin (MACRODANTIN) 50 MG capsule Take 50 mg by mouth daily.      . propafenone (RYTHMOL) 150 MG tablet Take 150 mg by mouth 3 (three) times daily.      . solifenacin (VESICARE) 10 MG tablet Take 5 mg by mouth daily.      . Vitamin D, Ergocalciferol,  (DRISDOL) 50000 UNITS CAPS Take 50,000 Units by mouth every 7 (seven) days.      Marland Kitchen warfarin (COUMADIN) 5 MG tablet Take 5-10 mg by mouth as directed.      . zolpidem (AMBIEN) 5 MG tablet Take 5 mg by mouth at bedtime.       Scheduled:   . atorvastatin  10 mg Oral q1800  . cefTRIAXone (ROCEPHIN)  IV  1 g Intravenous Once  . cephALEXin  500 mg Oral Q8H  . darifenacin  7.5 mg Oral Daily  . diltiazem  180 mg Oral Daily  . furosemide  40 mg Intravenous Once  . levothyroxine  125 mcg Oral QAC breakfast  . losartan  50 mg Oral Daily  . metoprolol  25 mg Oral BID  . propafenone  150 mg Oral TID  . sodium chloride  3 mL Intravenous Q12H  . zolpidem  5 mg Oral QHS   Continuous:   . DISCONTD: sodium chloride 75 mL/hr at 07/04/11 4098    Assessment/Plan: @HPROL @ Principal Problem:  *SDH (subdural hematoma) Active Problems:  Occipital fracture  Fall  A-fib  Warfarin-induced coagulopathy  Hypothyroidism  UTI (urinary tract infection) volume over load, clinically resolved, check f/u BNP  l   LOS: 5 days   Jilene Spohr D 07/05/2011, 11:38 AM

## 2011-07-06 LAB — BASIC METABOLIC PANEL
Calcium: 9.1 mg/dL (ref 8.4–10.5)
Creatinine, Ser: 0.62 mg/dL (ref 0.50–1.10)
GFR calc Af Amer: >90 mL/min (ref >90)
GFR calc non Af Amer: 80 mL/min — ABNORMAL LOW (ref >90)

## 2011-07-06 LAB — PRO B NATRIURETIC PEPTIDE: Pro B Natriuretic peptide (BNP): 500.2 pg/mL — ABNORMAL HIGH (ref 0–450)

## 2011-07-06 MED ORDER — POTASSIUM CHLORIDE CRYS ER 20 MEQ PO TBCR
30.0000 meq | EXTENDED_RELEASE_TABLET | Freq: Three times a day (TID) | ORAL | Status: AC
  Administered 2011-07-06 – 2011-07-07 (×6): 30 meq via ORAL
  Filled 2011-07-06 (×6): qty 1

## 2011-07-06 MED ORDER — POTASSIUM CHLORIDE 20 MEQ PO PACK: 40.0000 meq | PACK | Freq: Once | ORAL | Status: DC

## 2011-07-06 MED ORDER — POTASSIUM CHLORIDE CRYS ER 20 MEQ PO TBCR
40.0000 meq | EXTENDED_RELEASE_TABLET | Freq: Once | ORAL | Status: AC
  Administered 2011-07-06: 40 meq via ORAL
  Filled 2011-07-06: qty 2

## 2011-07-06 NOTE — Progress Notes (Signed)
Subjective: Patient is still well without complaint, no headaches dizziness or blurred vision. She did walk some without any problems. Followup BMP is better, labs revealed low potassium, more than likely secondary to previous diuretic. We will replete orally  Objective: Vital signs in last 24 hours: Temp:  [97.4 F (36.3 C)-98.8 F (37.1 C)] 98.4 F (36.9 C) (03/03 1000) Pulse Rate:  [55-70] 70  (03/03 1000) Resp:  [18-20] 18  (03/03 1000) BP: (112-168)/(66-88) 135/81 mmHg (03/03 1000) SpO2:  [91 %-95 %] 94 % (03/03 1000) Weight change:  Last BM Date: 07/03/11  Intake/Output from previous day: 03/02 0701 - 03/03 0700 In: 540 [P.O.:540] Out: -  Intake/Output this shift:    General appearance: alert and cooperative Resp: clear to auscultation bilaterally Neurologic: Grossly normal  Lab Results:  Results for orders placed during the hospital encounter of 06/30/11 (from the past 24 hour(s))  PRO B NATRIURETIC PEPTIDE     Status: Abnormal   Collection Time   07/06/11  6:25 AM      Component Value Range   Pro B Natriuretic peptide (BNP) 500.2 (*) 0 - 450 (pg/mL)  BASIC METABOLIC PANEL     Status: Abnormal   Collection Time   07/06/11  6:25 AM      Component Value Range   Sodium 136  135 - 145 (mEq/L)   Potassium 2.5 (*) 3.5 - 5.1 (mEq/L)   Chloride 98  96 - 112 (mEq/L)   CO2 26  19 - 32 (mEq/L)   Glucose, Bld 112 (*) 70 - 99 (mg/dL)   BUN 11  6 - 23 (mg/dL)   Creatinine, Ser 1.61  0.50 - 1.10 (mg/dL)   Calcium 9.1  8.4 - 09.6 (mg/dL)   GFR calc non Af Amer 80 (*) >90 (mL/min)   GFR calc Af Amer >90  >90 (mL/min)      Studies/Results: No results found.  Medications:  Prior to Admission:  Prescriptions prior to admission  Medication Sig Dispense Refill  . atorvastatin (LIPITOR) 10 MG tablet Take 10 mg by mouth daily.      Marland Kitchen diltiazem (DILACOR XR) 180 MG 24 hr capsule Take 180 mg by mouth daily.      . metoprolol (LOPRESSOR) 50 MG tablet Take 25 mg by mouth 2 (two)  times daily.      . nitrofurantoin (MACRODANTIN) 50 MG capsule Take 50 mg by mouth daily.      . propafenone (RYTHMOL) 150 MG tablet Take 150 mg by mouth 3 (three) times daily.      . solifenacin (VESICARE) 10 MG tablet Take 5 mg by mouth daily.      . Vitamin [name], Ergocalciferol, (DRISDOL) 50000 UNITS CAPS Take 50,000 Units by mouth every 7 (seven) days.      Marland Kitchen warfarin (COUMADIN) 5 MG tablet Take 5-10 mg by mouth as directed.      . zolpidem (AMBIEN) 5 MG tablet Take 5 mg by mouth at bedtime.       Scheduled:   . atorvastatin  10 mg Oral q1800  . cephALEXin  500 mg Oral Q8H  . darifenacin  7.5 mg Oral Daily  . diltiazem  180 mg Oral Daily  . levothyroxine  125 mcg Oral QAC breakfast  . losartan  50 mg Oral Daily  . metoprolol  25 mg Oral BID  . potassium chloride  30 mEq Oral TID  . potassium chloride  40 mEq Oral Once  . propafenone  150 mg Oral TID  .  sodium chloride  3 mL Intravenous Q12H  . zolpidem  5 mg Oral QHS  . DISCONTD: potassium chloride  40 mEq Oral Once   Continuous:   Assessment/Plan: Subdural hematoma status post fall with resultant occipital fracture UTI, currently on treatment Hypokalemia probably secondary to previous diuretic, replace orally History of A. fib off Coumadin Hypothyroidism Resolved  volume overload, followup BMP improved  LOS: 6 days   Shannon [name] 07/06/2011, 11:21 AM

## 2011-07-06 NOTE — Progress Notes (Signed)
Occupational Therapy Treatment Patient Details Name: Shannon [name] MRN: [id number] DOB: Jul 12, 1924 Today's Date: 07/06/2011  OT Assessment/Plan OT Assessment/Plan Comments on Treatment Session: Pt progressing towards goals but continues to demonstrate decreased safety awarenss during functional mobility.  Pt and family have started to look into option of SNF upon d/c from hospital. Will update d/c plan accordingly. OT Plan: Discharge plan needs to be updated OT Frequency: Min 2X/week Follow Up Recommendations: Skilled nursing facility;Supervision/Assistance - 24 hour Equipment Recommended: Defer to next venue OT Goals ADL Goals Additional ADL Goal #1: PT will complete all aspects of toileting on regular commode next to sink with S. ADL Goal: Additional Goal #1 - Progress: Progressing toward goals  OT Treatment Precautions/Restrictions  Precautions Precautions: Fall Restrictions Weight Bearing Restrictions: No   ADL ADL Eating/Feeding: Performed;Modified independent Where Assessed - Eating/Feeding: Edge of bed Toilet Transfer: Minimal assistance;Simulated Toilet Transfer Details (indicate cue type and reason): min assist for maneuvering RW safely Toilet Transfer Method: Proofreader: Other (comment) (chair) Equipment Used: Rolling walker Ambulation Related to ADLs: Pt requires moderate cueing for safe use of RW Mobility  Bed Mobility Bed Mobility: Yes Supine to Sit: 5: Supervision Supine to Sit Details (indicate cue type and reason): increased time Sitting - Scoot to Edge of Bed: 5: Supervision Transfers Sit to Stand: 4: Min assist;From bed;With upper extremity assist Sit to Stand Details (indicate cue type and reason): cueing to scoot to EOB before initiating transfer Stand to Sit: 4: Min assist;With armrests;With upper extremity assist;To chair/3-in-1 Stand to Sit Details: decreased control of descent Exercises    End of Session OT - End of  Session Equipment Utilized During Treatment: Gait belt Activity Tolerance: Patient tolerated treatment well Patient left: in bed;with call bell in reach Nurse Communication: Other (comment) (pt in chair) General Behavior During Session: [facility] for tasks performed Cognition: Impaired Cognitive Impairment: decreased safety awareness, slow to process information, mild memory loss and unawareness of deficits     4:10 PM 07/06/2011 Cipriano Mile OTR/L Pager [phone] Office [phone]

## 2011-07-07 LAB — BASIC METABOLIC PANEL
Calcium: 9.5 mg/dL (ref 8.4–10.5)
GFR calc Af Amer: >90 mL/min (ref >90)
GFR calc non Af Amer: 80 mL/min — ABNORMAL LOW (ref >90)
Glucose, Bld: 139 mg/dL — ABNORMAL HIGH (ref 70–99)
Sodium: 134 mEq/L — ABNORMAL LOW (ref 135–145)

## 2011-07-07 LAB — GLUCOSE, CAPILLARY

## 2011-07-07 MED ORDER — POLYETHYLENE GLYCOL 3350 17 G PO PACK
17.0000 g | PACK | Freq: Every day | ORAL | Status: DC
  Administered 2011-07-08: 17 g via ORAL
  Filled 2011-07-07: qty 1

## 2011-07-07 MED ORDER — BISACODYL 10 MG RE SUPP
10.0000 mg | Freq: Every day | RECTAL | Status: DC | PRN
Start: 2011-07-07 — End: 2011-07-08
  Administered 2011-07-07: 10 mg via RECTAL
  Filled 2011-07-07 (×2): qty 1

## 2011-07-07 NOTE — Progress Notes (Signed)
Subjective: Looks better today than she did several days ago when last seen. The weekend has been helpful. She is being treated for urinary tract infection now. Still mildly confused but slightly more alert and more quickly conversive. She has not had a bowel movement apparently in several days  Objective: Weight change:  No intake or output data in the 24 hours ending 07/07/11 0808   Filed Vitals:   07/06/11 1553 07/06/11 2126 07/07/11 0153 07/07/11 0417  BP: 124/69 125/84 144/75 155/77  Pulse: 59 63 57 58  Temp: 97.2 F (36.2 C) 97.7 F (36.5 C) 98.4 F (36.9 C) 99.1 F (37.3 C)  TempSrc: Oral Oral Oral Oral  Resp: 18 18 18 16   Height:      Weight:      SpO2: 94% 94% 93% 93%   General Appearance: Alert, moderately confused.   Head: Normocephalic, ecchymosis along the occiput and posterior neck  Neck: Supple, symmetrical with improving ecchymosis over left posterior neck Lungs: Clear to auscultation bilaterally, respirations unlabored  Heart: Regular rate and rhythm, S1 and S2 normal, no murmur, rub or gallop  Abdomen: Soft, non-tender, bowel sounds active all four quadrants, no masses, no organomegaly  Extremities: Extremities normal, atraumatic, no cyanosis or edema  Pulses: 2+ and symmetric all extremities  Skin: Skin color, texture, turgor normal, no rashes or lesions  Neuro: CNII-XII intact. Normal strength, sensation, moves all extremities well.   Lab Results:  [facility] 07/06/11 0625  NA 136  K 2.5*  CL 98  CO2 26  GLUCOSE 112*  BUN 11  CREATININE 0.62  CALCIUM 9.1  MG --  PHOS --   No results found for this basename: AST:2,ALT:2,ALKPHOS:2,BILITOT:2,PROT:2,ALBUMIN:2 in the last 72 hours No results found for this basename: LIPASE:2,AMYLASE:2 in the last 72 hours No results found for this basename: WBC:2,NEUTROABS:2,HGB:2,HCT:2,MCV:2,PLT:2 in the last 72 hours No results found for this basename: CKTOTAL:3,CKMB:3,CKMBINDEX:3,TROPONINI:3 in the last 72 hours No  components found with this basename: POCBNP:3 No results found for this basename: DDIMER:2 in the last 72 hours No results found for this basename: HGBA1C:2 in the last 72 hours No results found for this basename: CHOL:2,HDL:2,LDLCALC:2,TRIG:2,CHOLHDL:2,LDLDIRECT:2 in the last 72 hours No results found for this basename: TSH,T4TOTAL,FREET3,T3FREE,THYROIDAB in the last 72 hours No results found for this basename: VITAMINB12:2,FOLATE:2,FERRITIN:2,TIBC:2,IRON:2,RETICCTPCT:2 in the last 72 hours  Studies/Results: No results found. Medications: Scheduled Meds:   . atorvastatin  10 mg Oral q1800  . cephALEXin  500 mg Oral Q8H  . darifenacin  7.5 mg Oral Daily  . diltiazem  180 mg Oral Daily  . levothyroxine  125 mcg Oral QAC breakfast  . losartan  50 mg Oral Daily  . metoprolol  25 mg Oral BID  . potassium chloride  30 mEq Oral TID  . potassium chloride  40 mEq Oral Once  . propafenone  150 mg Oral TID  . sodium chloride  3 mL Intravenous Q12H  . zolpidem  5 mg Oral QHS  . DISCONTD: potassium chloride  40 mEq Oral Once   Continuous Infusions:  PRN Meds:.acetaminophen, acetaminophen, ALPRAZolam, ondansetron (ZOFRAN) IV, ondansetron, polyethylene glycol  Assessment/Plan:  Patient Active Problem List  Diagnoses  Date Noted  .  SDH (subdural hematoma) - as per neurology/neurosurgery. Has temporal lobe hematoma with vasogenic edema as well as subdural hematoma. Will need repeat CT scan on Thursday or Friday this week 06/30/2011  Occipital fracture - repeat CT scan in several days  06/30/2011  Fall  06/30/2011  A-fib - continue off  Coumadin for now  06/30/2011  Warfarin-induced coagulopathy  06/30/2011  Hypothyroidism - Synthroid resumed  Escherichia coli UTI - continue Keflex for a total of 7 daysthanks Constipation - we'll Dulcolax 10 mg suppositories as needed and MiraLax daily Continue PT OT  06/30/2011  Disp: Skilled nursing facility placement in a.m. for hopeful transition  to home in 2-3 weeks    LOS: 7 days   Shannon [name] 07/07/2011, 8:08 AM

## 2011-07-07 NOTE — Progress Notes (Signed)
Clinical Social Worker made contact with Admissions Director Tresa Endo Self at [facility] (patient and family's preference) and a bed will be available on Tuesday, 07/08/11. CSW will continue to monitor patient progress and will facilitate discharge when patient medically stable for discharge.  Genelle Bal, MSW, LCSW [phone]

## 2011-07-07 NOTE — Progress Notes (Signed)
Physical Therapy Treatment Patient Details Name: Shannon [name] MRN: [id number] DOB: [birth year] Today's Date: 07/07/2011  PT Assessment/Plan  PT - Assessment/Plan Comments on Treatment Session: Pt s/p fall with resulting occipital fx and SDH. Pt progressing nicely with ambulation today, much more accepting of RW. Pt continues to be challenged by balance deficits. Excited about going to rehab and getting closer to going home.  PT Plan: Discharge plan needs to be updated;Frequency needs to be updated PT Frequency: Min 3X/week Follow Up Recommendations: Skilled nursing facility;Supervision/Assistance - 24 hour Equipment Recommended: Defer to next venue PT Goals  Acute Rehab PT Goals Pt will go Sit to Stand: with modified independence PT Goal: Sit to Stand - Progress: Progressing toward goal Pt will go Stand to Sit: with modified independence PT Goal: Stand to Sit - Progress: Progressing toward goal Pt will Ambulate: >150 feet;with modified independence;with rolling walker PT Goal: Ambulate - Progress: Progressing toward goal Pt will Go Up / Down Stairs: 3-5 stairs;with supervision PT Goal: Up/Down Stairs - Progress: Progressing toward goal Additional Goals Additional Goal #1: Score >/= 46/56 on Berg balance test to indicate decreased risk for falls PT Goal: Additional Goal #1 - Progress: Progressing toward goal  PT Treatment Precautions/Restrictions  Precautions Precautions: Fall Precaution Comments: Pt fell on cement and hit head.  Amnestic of the event. Required Braces or Orthoses: No Restrictions Weight Bearing Restrictions: No Mobility (including Balance) Bed Mobility Bed Mobility: No (seated at start) Transfers Transfers: Yes Sit to Stand: 4: Min assist;With upper extremity assist;From chair/3-in-1;With armrests Sit to Stand Details (indicate cue type and reason): Verbal cues for appropriate placement of hands. Cues to scoot to EOB prior to standing.  Stand to Sit: 4: Min  assist;With armrests;With upper extremity assist;To chair/3-in-1 Stand to Sit Details: Assist to control descent. Pt "plops" into chair prior to being square with chair, it appeared to take her by suprise.  Ambulation/Gait Ambulation/Gait: Yes Ambulation/Gait Assistance: Other (comment) (min-guard assist) Ambulation/Gait Assistance Details (indicate cue type and reason): Pt complient with RW today. Cues for safe placement/distancing of RW. Verbal cues for negotiation of RW. Cues to maintain straight path as pt veering multiple directions throughout particularly with visual scanning.  Ambulation Distance (Feet): 275 Feet Assistive device: Rolling walker Gait Pattern: Trunk flexed;Step-through pattern;Decreased stride length;Shuffle Stairs: No  Posture/Postural Control Posture/Postural Control: No significant limitations Balance Balance Assessed: Yes Static Standing Balance Static Standing - Balance Support: No upper extremity supported Static Standing - Level of Assistance: 4: Min assist Static Standing - Comment/# of Minutes: Practiced Rhomberg eyes open and closed, modified tandem eyes open. Pt requiring min assist frequently to assist with correction of balance, pt with noted hip corrective reactions. Performed each task 2 x 30 sec.  End of Session PT - End of Session Equipment Utilized During Treatment: Gait belt Activity Tolerance: Patient tolerated treatment well Patient left: with call bell in reach;in chair Nurse Communication: Mobility status for transfers;Mobility status for ambulation General Behavior During Session: [facility] for tasks performed Cognition: Impaired Cognitive Impairment: Difficulty following instructions with cues, slow processing and problem solving, decreased awareness of deficits although pt making improvements from evaluation.  Sherie Don 07/07/2011, 4:33 PM Sherie Don) Carleene Mains PT, DPT Acute Rehabilitation [phone]

## 2011-07-08 LAB — BASIC METABOLIC PANEL
BUN: 10 mg/dL (ref 6–23)
GFR calc Af Amer: >90 mL/min (ref >90)
GFR calc non Af Amer: 81 mL/min — ABNORMAL LOW (ref >90)
Potassium: 4.4 mEq/L (ref 3.5–5.1)
Sodium: 132 mEq/L — ABNORMAL LOW (ref 135–145)

## 2011-07-08 LAB — CBC
Hemoglobin: 13.4 g/dL (ref 12.0–15.0)
MCHC: 34.2 g/dL (ref 30.0–36.0)

## 2011-07-08 LAB — DIFFERENTIAL
Basophils Absolute: 0 10*3/uL (ref 0.0–0.1)
Basophils Relative: 0 % (ref 0–1)
Monocytes Relative: 13 % — ABNORMAL HIGH (ref 3–12)
Neutro Abs: 6.4 10*3/uL (ref 1.7–7.7)
Neutrophils Relative %: 72 % (ref 43–77)

## 2011-07-08 MED ORDER — BISACODYL 10 MG RE SUPP: 10.0000 mg | Freq: Every day | RECTAL | Status: AC | PRN

## 2011-07-08 MED ORDER — POLYETHYLENE GLYCOL 3350 17 G PO PACK: 17.0000 g | PACK | Freq: Every day | ORAL | Status: AC

## 2011-07-08 MED ORDER — LEVOTHYROXINE SODIUM 125 MCG PO TABS: 125.0000 ug | ORAL_TABLET | Freq: Every day | ORAL | Status: DC

## 2011-07-08 NOTE — Progress Notes (Signed)
Inpatient Diabetes Program Recommendations  AACE/ADA: New Consensus Statement on Inpatient Glycemic Control (2009)  Target Ranges:  Prepandial:   less than 140 mg/dL      Peak postprandial:   less than 180 mg/dL (1-2 hours)      Critically ill patients:  140 - 180 mg/dL   Reason for assessment:  CBG last night was 350?  No other CBGs documented.  Recommend checking CBGs TIDHS.  If they are elevated start Novolog correction.  Inpatient Diabetes Program Recommendations Correction (SSI): start sensitive if needed   Thank you  Piedad Climes RN,BSN,CDE Inpatient Diabetes Coordinator

## 2011-07-08 NOTE — Progress Notes (Signed)
Shannon [name] is being discharged to [facility] skilled nursing today for short-term rehab. Discharge information faxed to facility and contact made with facility and family. The patient will be transported to [facility] by family.  Genelle Bal, MSW, LCSW [phone]

## 2011-07-08 NOTE — Progress Notes (Signed)
   CARE MANAGEMENT NOTE 07/08/2011  Patient:  Shannon [name], Shannon [name]   Account Number:  [id number]  Date Initiated:  07/02/2011  Documentation initiated by:  Junius Creamer  Subjective/Objective Assessment:   adm w sudural hematoma     Action/Plan:   lives w husband of >11yrs, indep pta   Anticipated DC Date:  07/07/2011   Anticipated DC Plan:  SKILLED NURSING FACILITY  In-house referral  Clinical Social Worker      DC Planning Services  CM consult      Choice offered to / List presented to:             Status of service:   Medicare Important Message given?   (If response is "NO", the following Medicare IM given date fields will be blank) Date Medicare IM given:   Date Additional Medicare IM given:    Discharge Disposition:    Per UR Regulation:    Comments:  07/04/11 JULIE AMERSON,RN,BSN 1400 CM REFERRAL FOR HOME HEALTH NEEDS.  PER MD PROGRESS NOTES, PT CONFUSED AND WILL NEED SNF PLACEMENT.  BED SEARCH IN PROGRESS.  WILL DEFER TO CSW.  2/27 spoke w pt, pcp is dr Elson Areas gates, left hhc list, not sure of dc needs, await pt/ot rec, will cont to follow. debbie dowell rn,bsn [phone]

## 2011-07-08 NOTE — Progress Notes (Signed)
Physical Therapy Treatment Patient Details Name: Shannon [name] MRN: [id number] DOB: Aug 12, 1924 Today's Date: 07/08/2011  PT Assessment/Plan  PT - Assessment/Plan Comments on Treatment Session: Pt s/p fall with resulting occipital fx and SDH. Making progress with static balance, ambulation. Continues to be easily distracted externally which has a negative effect on her dynamic balance. Pt more lethargic today, pt reports she was unable to sleep much last night.  PT Plan: Discharge plan needs to be updated PT Frequency: Min 3X/week Follow Up Recommendations: Skilled nursing facility;Supervision/Assistance - 24 hour Equipment Recommended: Defer to next venue PT Goals  Acute Rehab PT Goals Pt will go Sit to Stand: with modified independence PT Goal: Sit to Stand - Progress: Progressing toward goal Pt will go Stand to Sit: with modified independence PT Goal: Stand to Sit - Progress: Progressing toward goal Pt will Ambulate: >150 feet;with modified independence;with rolling walker PT Goal: Ambulate - Progress: Progressing toward goal Pt will Go Up / Down Stairs: 3-5 stairs;with supervision PT Goal: Up/Down Stairs - Progress: Not met Additional Goals Additional Goal #1: Score >/= 46/56 on Berg balance test to indicate decreased risk for falls PT Goal: Additional Goal #1 - Progress: Progressing toward goal  PT Treatment Precautions/Restrictions  Precautions Precautions: Fall Precaution Comments: Pt fell on cement and hit head.  Amnestic of the event. Required Braces or Orthoses: No Restrictions Weight Bearing Restrictions: No Mobility (including Balance) Bed Mobility Bed Mobility: No (seated at start) Transfers Transfers: Yes Sit to Stand: With upper extremity assist;From chair/3-in-1;With armrests;Other (comment) (min-guard assist) Sit to Stand Details (indicate cue type and reason): Pt with multiple attempts. Unable to recall to scoot to edge of seat prior to standing, required  verbal cues to perform after failed attempts to stand.  Stand to Sit: 4: Min assist;With armrests;With upper extremity assist;To chair/3-in-1 Stand to Sit Details: Verbal cues for UE placement, to back all the way to chair prior to sit, control of descent.  Ambulation/Gait Ambulation/Gait: Yes Ambulation/Gait Assistance: 4: Min assist Ambulation/Gait Assistance Details (indicate cue type and reason): Cues for safe placement/distancing of RW. Verbal cues for negotiation of RW. Cues to maintain straight path as pt continues with veering multiple directions throughout particularly with visual scanning. Pt easily distracted by people and objects in the hallway and stops frequently (decreased multitasking), cues for maintaining focus. Ambulation Distance (Feet): 200 Feet Assistive device: Rolling walker Gait Pattern: Trunk flexed;Step-through pattern;Decreased stride length;Shuffle (wide base of support) Stairs: No  Posture/Postural Control Posture/Postural Control: No significant limitations Balance Balance Assessed: Yes Static Standing Balance Static Standing - Balance Support: No upper extremity supported Static Standing - Level of Assistance: 4: Min assist Static Standing - Comment/# of Minutes: Practiced normal stance and Rhomberg eyes open and closed, modified tandem eyes open. Pt requiring min assist to assist with correction of balance howeverrequired decreased frequencing of correction. Performed each task 2 x 30 sec End of Session PT - End of Session Equipment Utilized During Treatment: Gait belt Activity Tolerance: Patient tolerated treatment well Patient left: with call bell in reach;in chair Nurse Communication: Mobility status for transfers;Mobility status for ambulation;Other (comment) (increased lethargy) General Behavior During Session: [facility] for tasks performed Cognition: Impaired Cognitive Impairment: Difficulty following instructions with cues, slow processing and problem  solving, decreased awareness of deficits although pt making improvements.  Sherie Don 07/08/2011, 9:34 AM  Dahlia Client Beverely Pace) Carleene Mains PT, DPT Acute Rehabilitation [phone]

## 2011-07-08 NOTE — Progress Notes (Signed)
Patient education and discharge instructions given to patient and family. Patient left via wheelchair to be transported to [facility] for SNF Rehab. No further questions. Patient in no signs of acute distress at discharge.   Yuleni Burich ([place]) Radene Ou, Charity fundraiser

## 2011-07-08 NOTE — Discharge Summary (Addendum)
Physician Discharge Summary  NAME:Shannon [name]  ZOX:[id number]  DOB: 08/19/1924   Admit date: 06/30/2011 Discharge date: 07/08/2011  Discharge Diagnoses:  Principal Problem: SDH (subdural hematoma) - secondary to fall on 06/30/2011 - plans are for a repeat CT scan, noncontrast, later this week. Should plan for Friday, March 8, or Monday, March 11, with followup appointment - March 12 or 13 with Dr. Donalee Citrin Active Problems: Occipital fracture - occurred at time of fall.  Fall - patient fell backwards and hit occiput on concrete when she lost her grip on a door handle  Hypertension - controlled  Hyperlipidemia - continue on atorvastatin  A-fib - history of, but patient in sinus rhythm during this admission  Warfarin-induced coagulopathy - currently off Coumadin. May resume Coumadin when cleared by Dr. Donalee Citrin neurosurgically  Hypothyroidism - continues on thyroid replacement therapy  UTI (urinary tract infection) - pansensitive Escherichia coli urinary tract infection noted on 07/02/2011. Treat through 07/12/2011   Discharge Condition: Improved but tenuous  Hospital Course: Shannon [name] is a very pleasant 78 year old female with a history of atrial fibrillation, hypertension, hypothyroidism, and atrial fibrillation who presented to the [facility] emergency room on 06/30/2011 after her hand slipped off a door handle while entering a restaurant and she fell backwards and hit her occiput on concrete. She apparently was unconscious for about 45 minutes. She was found to have a left occipital nondisplaced fracture and also a left tentorial subdural hematoma extending into the left middle cranial fossa. Diffuse atrophy was noted. C-spine revealed no fractures. She was admitted for observation and subsequent CT scanning revealed interval development of low density bilateral subdural hematomas measuring 3 mm in thickness on the left and 1.9 mm in thickness on the right with a decrease in the  tentorial subdural hematoma. There was a 1.1 cm focal hematoma in the anterior aspect of the right [place] temporal bone with surrounding temporal lobe vasogenic edema unchanged from prior exam. Dr. Wynetta Emery recommended repeating the CT scan in approximately one week and he would like to see her in followup in his office as well. While hospitalized, she was found to have a urinary tract infection which is now being treated, a pansensitive Escherichia coli. Blood pressure was monitored and treated and history of atrial fibrillation actually revealed normal sinus rhythm during this hospitalization. Patient was vigorously hydrated during the first 4-5 days of admission and required diuresis and holding the fluid secondary to volume overload 07/04/2011 Constipation was ongoing during the hospitalization and MiraLAX will be given daily at discharge as well as stool softeners and when necessary use of Dulcolax suppositories. Patient still is just moderately unstable with gait and is required walker to walk. She will receive further physical therapy and gait training at [facility] skilled nursing facility with hopes for transition back home in several weeks. The patient was found to be hypokalemic on 06/08/2011 and potassium was replaced orally.  Consults: Neurosurgery - Dr. Donalee Citrin  Disposition:    [facility] - for further rehabilitation  Discharge vital signs:   Filed Vitals:   07/07/11 1804 07/07/11 2200 07/08/11 0200 07/08/11 0600  BP: 117/66 139/86 143/73 162/85  Pulse: 66 65 67 60  Temp: 97.8 F (36.6 C) 98.5 F (36.9 C) 98.2 F (36.8 C) 98.5 F (36.9 C)  TempSrc: Oral Oral Oral Oral  Resp: 18 18 18 18   Height:      Weight:      SpO2: 95% 94%  95% 96%    Discharge exam:   General Appearance: Alert, minimally confused. Speech clear Head: Normocephalic, ecchymosis along the occiput and posterior neck resolving. No hematoma  Neck: Supple,  symmetrical  Lungs: Clear to auscultation bilaterally, respirations unlabored  Heart: Regular rate and rhythm, S1 and S2 normal, no murmur, rub or gallop  Abdomen: Soft, non-tender, bowel sounds active all four quadrants, no masses, no organomegaly. Nondistended  Extremities: Extremities normal, atraumatic, no cyanosis or edema  Pulses: 2+ and symmetric all extremities  Skin: Skin color, texture, turgor normal, no rashes or lesions  Neuro: CNII-XII intact. Normal strength, sensation, moves all extremities well. Nonfocal     [place], A[place]  Home Medication Instructions ZOX:[id number]   Printed on:07/08/11 0715  Medication Information                    propafenone (RYTHMOL) 150 MG tablet Take 150 mg by mouth 3 (three) times daily.           metoprolol (LOPRESSOR) 50 MG tablet Take 25 mg by mouth 2 (two) times daily.           atorvastatin (LIPITOR) 10 MG tablet Take 10 mg by mouth daily.           solifenacin (VESICARE) 10 MG tablet Take 5 mg by mouth daily.           zolpidem (AMBIEN) 5 MG tablet Take 5 mg by mouth at bedtime.           diltiazem (DILACOR XR) 180 MG 24 hr capsule Take 180 mg by mouth daily.           Vitamin D, Ergocalciferol, (DRISDOL) 50000 UNITS CAPS Take 50,000 Units by mouth every 7 (seven) days.           bisacodyl (DULCOLAX) 10 MG suppository Place 1 suppository (10 mg total) rectally daily as needed.           levothyroxine (SYNTHROID, LEVOTHROID) 125 MCG tablet Take 1 tablet (125 mcg total) by mouth daily before breakfast.           polyethylene glycol (MIRALAX / GLYCOLAX) packet Take 17 g by mouth daily.               Things to follow up in the outpatient setting: Follow mental status carefully as well as checking for focal neurologic deficits. Will need followup potassium check, monitor bowels for constipation, and continue physical therapy for unsteady gait which is improved  Time coordinating discharge: 35 minutes  The results of  significant diagnostics from this hospitalization (including imaging, microbiology, ancillary and laboratory) are listed below for reference.    Significant Diagnostic Studies: Ct Head Wo Contrast  07/03/2011  *RADIOLOGY REPORT*  Clinical Data: Followup intracranial hemorrhage.  CT HEAD WITHOUT CONTRAST  Technique:  Contiguous axial images were obtained from the base of the skull through the vertex without contrast.  Comparison: 07/01/2011.  Findings: Nondisplaced left occipital fracture once again noted.  Interval development of the low density bilateral subdural hematomas (measuring 3 mm on the left and 1.9 mm on the right).  Decrease conspicuity of tentorial subdural hematoma.  1.1 cm focal hematoma anterior aspect of the right petrous temporal bone with surrounding temporal lobe vasogenic edema unchanged.  Global atrophy.  Ventricular prominence may be related to atrophy. Difficult to completely exclude a mild component hydrocephalus. Appearance is without change.  Partially empty sella.  IMPRESSION: Interval development of the low density bilateral subdural hematomas (measuring  3 mm on the left and 1.9 mm on the right).  Decrease conspicuity of tentorial subdural hematoma.  1.1 cm focal hematoma anterior aspect of the right petrous temporal bone with surrounding temporal lobe vasogenic edema unchanged.  Please see above.  Original Report Authenticated By: Fuller Canada, M.D.   Ct Head Wo Contrast  07/01/2011  *RADIOLOGY REPORT*  Clinical Data: Fall.  Headache.  CT HEAD WITHOUT CONTRAST  Technique:  Contiguous axial images were obtained from the base of the skull through the vertex without contrast.  Comparison: 06/30/2011.  Findings: Long nondisplaced left occipital fracture once again noted.  Tentorial subdural hematoma without significant change.  New 1.1 cm focal blood collection anterior to the right petrous temporal bone with surrounding vasogenic edema.  This may represent extra-axial blood  although it is possible this represents a temporal lobe hemorrhagic contusion.  The adjacent mastoid air cells appear clear without discrete fracture at this level noted.  Suggestion of small area of subarachnoid blood as versus artifact lateral anterior left temporal lobe (series 2 image 11).  Global atrophy.  Ventricular prominence raises possibility of superimposed mild hydrocephalus.  No CT evidence of acute large thrombotic infarct or intracranial mass.  Vascular calcifications.  IMPRESSION: Long nondisplaced left occipital fracture once again noted.  Tentorial subdural hematoma without significant change.  New 1.1 cm focal blood collection anterior to the right petrous temporal bone with surrounding vasogenic edema.  This may represent extra-axial blood although it is possible this represents a temporal lobe hemorrhagic contusion.  Suggestion of small area of subarachnoid blood as versus artifact lateral anterior left temporal lobe (series 2 image 11).  Original Report Authenticated By: Fuller Canada, M.D.   Ct Head Wo Contrast  06/30/2011  *RADIOLOGY REPORT*  Clinical Data:  fall. LACERATION  CT HEAD WITHOUT CONTRAST CT CERVICAL SPINE WITHOUT CONTRAST  Technique:  Multidetector CT imaging of the head and cervical spine was performed following the standard protocol without IV contrast. Multiplanar CT image reconstructions of the cervical spine were also generated.  Comparison: None  CT HEAD  Findings: There is left occipital scalp soft tissue swelling. There is a nondisplaced left occipital bone fracture. Suspect a left tentorial subdural hematoma extending to the left middle cranial fossa at its medial aspect. Diffuse atrophy.  The lateral ventricles are dilated out of portion to the mildly prominent sulci.  There is no midline shift.  No focal parenchymal edema, mass, or mass effect.  IMPRESSION:  1.  Nondisplaced left occipital bone fracture. 2.  Probable small left tentorial subdural hematoma without  significant mass effect. I telephoned the critical test results to Dr. Juleen China at the time of interpretation. 3. Possible normal pressure (communicating) hydrocephalus.  CT CERVICAL SPINE  Findings: Left occipital bone fracture is again evident. Normal cervical spine alignment.  There is narrowing of interspaces C2-3, C4-5, C5-6, C6-7 with endplate spurring Z6-X0 posteriorly.  Facets seated with multilevel degenerative   hypertrophic changes.  No fracture.  Facet and uncovertebral disease result in right-sided foraminal encroachment C3-4 and C4-5.  There is no prevertebral soft tissue swelling.  Visualized lung apices clear.  There is calcified plaque in the left carotid bifurcation.  IMPRESSION:  1.  Negative for fracture or other acute cervical spine abnormality. 2.  Multilevel degenerative changes as above. 3.  Left carotid bifurcation calcified plaque.  Original Report Authenticated By: Osa Craver, M.D.   Ct Cervical Spine Wo Contrast  06/30/2011  *RADIOLOGY REPORT*  Clinical Data:  fall. LACERATION  CT HEAD WITHOUT CONTRAST CT CERVICAL SPINE WITHOUT CONTRAST  Technique:  Multidetector CT imaging of the head and cervical spine was performed following the standard protocol without IV contrast. Multiplanar CT image reconstructions of the cervical spine were also generated.  Comparison: None  CT HEAD  Findings: There is left occipital scalp soft tissue swelling. There is a nondisplaced left occipital bone fracture. Suspect a left tentorial subdural hematoma extending to the left middle cranial fossa at its medial aspect. Diffuse atrophy.  The lateral ventricles are dilated out of portion to the mildly prominent sulci.  There is no midline shift.  No focal parenchymal edema, mass, or mass effect.  IMPRESSION:  1.  Nondisplaced left occipital bone fracture. 2.  Probable small left tentorial subdural hematoma without significant mass effect. I telephoned the critical test results to Dr. Juleen China at the time  of interpretation. 3. Possible normal pressure (communicating) hydrocephalus.  CT CERVICAL SPINE  Findings: Left occipital bone fracture is again evident. Normal cervical spine alignment.  There is narrowing of interspaces C2-3, C4-5, C5-6, C6-7 with endplate spurring Z6-X0 posteriorly.  Facets seated with multilevel degenerative   hypertrophic changes.  No fracture.  Facet and uncovertebral disease result in right-sided foraminal encroachment C3-4 and C4-5.  There is no prevertebral soft tissue swelling.  Visualized lung apices clear.  There is calcified plaque in the left carotid bifurcation.  IMPRESSION:  1.  Negative for fracture or other acute cervical spine abnormality. 2.  Multilevel degenerative changes as above. 3.  Left carotid bifurcation calcified plaque.  Original Report Authenticated By: Osa Craver, M.D.   Dg Chest Port 1 View  07/04/2011  *RADIOLOGY REPORT*  Clinical Data: Short of breath.  Wheezing.  PORTABLE CHEST - 1 VIEW  Comparison: 06/30/2011  Findings: Heart is mildly enlarged.  Bibasilar atelectasis. Vascular congestion without interstitial edema.  Tiny right pleural effusion. No pneumothorax.  IMPRESSION: Cardiomegaly and vascular congestion without pulmonary edema.  Tiny right effusion is noted as well.  Original Report Authenticated By: Donavan Burnet, M.D.   Dg Chest Port 1 View  06/30/2011  *RADIOLOGY REPORT*  Clinical Data: Fall and rule out aspiration.  PORTABLE CHEST - 1 VIEW  Comparison: None.  Findings: Single view of the chest was obtained.  The aortic arch is prominent with calcifications.  Few linear densities at the left lung base could represent atelectasis but an early aspiration cannot be excluded.  Upper lungs are clear.  Heart size is upper limits of normal.  IMPRESSION: Linear densities at the left lung base most likely represent atelectasis but early aspiration cannot be excluded.  Mild enlargement of the cardiac silhouette.  Original Report Authenticated  By: Richarda Overlie, M.D.    Microbiology: Recent Results (from the past 240 hour(s))  MRSA PCR SCREENING     Status: Normal   Collection Time   07/01/11  3:16 AM      Component Value Range Status Comment   MRSA by PCR NEGATIVE  NEGATIVE  Final   URINE CULTURE     Status: Normal   Collection Time   07/02/11  4:40 AM      Component Value Range Status Comment   Specimen Description URINE, RANDOM   Final    Special Requests NONE   Final    Culture  Setup Time [id number]   Final    Colony Count Unable to Quantitate   Final    Culture ESCHERICHIA COLI   Final  Report Status 07/03/2011 FINAL   Final    Organism ID, Bacteria ESCHERICHIA COLI   Final      Labs:  Lab Results  Component Value Date   WBC 8.9 07/08/2011   HGB 13.4 07/08/2011   HCT 39.2 07/08/2011   PLT 201 07/08/2011   GLUCOSE 125* 07/08/2011   ALT 23 07/01/2011   AST 24 07/01/2011   NA 132* 07/08/2011   K 4.4 07/08/2011   CL 100 07/08/2011   CREATININE 0.59 07/08/2011   BUN 10 07/08/2011   CO2 25 07/08/2011   INR 1.19 07/03/2011   Signed: Carlyne Keehan NEVILL 07/08/2011, 6:36 AM

## 2011-07-18 ENCOUNTER — Other Ambulatory Visit: Payer: Self-pay | Admitting: Neurosurgery

## 2011-07-18 DIAGNOSIS — S065X9A Traumatic subdural hemorrhage with loss of consciousness of unspecified duration, initial encounter: Secondary | ICD-10-CM

## 2011-07-21 ENCOUNTER — Other Ambulatory Visit: Payer: Self-pay

## 2011-07-22 ENCOUNTER — Ambulatory Visit
Admission: RE | Admit: 2011-07-22 | Discharge: 2011-07-22 | Disposition: A | Discharge status: Home / Self Care | Payer: Medicare Other | Source: Ambulatory Visit | Attending: Neurosurgery | Admitting: Neurosurgery

## 2011-07-22 DIAGNOSIS — S065X9A Traumatic subdural hemorrhage with loss of consciousness of unspecified duration, initial encounter: Secondary | ICD-10-CM

## 2011-08-29 ENCOUNTER — Other Ambulatory Visit: Payer: Self-pay | Admitting: Neurosurgery

## 2011-08-29 DIAGNOSIS — S065X9A Traumatic subdural hemorrhage with loss of consciousness of unspecified duration, initial encounter: Secondary | ICD-10-CM

## 2011-09-03 ENCOUNTER — Ambulatory Visit
Admission: RE | Admit: 2011-09-03 | Discharge: 2011-09-03 | Disposition: A | Discharge status: Home / Self Care | Payer: Medicare Other | Source: Ambulatory Visit | Attending: Neurosurgery | Admitting: Neurosurgery

## 2011-09-03 DIAGNOSIS — S065X9A Traumatic subdural hemorrhage with loss of consciousness of unspecified duration, initial encounter: Secondary | ICD-10-CM

## 2011-11-07 ENCOUNTER — Other Ambulatory Visit: Payer: Self-pay | Admitting: Dermatology

## 2011-11-17 ENCOUNTER — Ambulatory Visit: Payer: BC Managed Care – PPO | Admitting: Radiation Oncology

## 2011-12-01 ENCOUNTER — Encounter: Payer: Self-pay | Admitting: Radiation Oncology

## 2011-12-01 ENCOUNTER — Ambulatory Visit
Admission: RE | Admit: 2011-12-01 | Discharge: 2011-12-01 | Disposition: A | Discharge status: Home / Self Care | Payer: Medicare Other | Source: Ambulatory Visit | Attending: Radiation Oncology | Admitting: Radiation Oncology

## 2011-12-01 VITALS — BP 139/81 | HR 75 | Temp 97.7°F | Resp 18 | Wt 149.8 lb

## 2011-12-01 DIAGNOSIS — D0510 Intraductal carcinoma in situ of unspecified breast: Secondary | ICD-10-CM | POA: Insufficient documentation

## 2011-12-01 DIAGNOSIS — S065X9A Traumatic subdural hemorrhage with loss of consciousness of unspecified duration, initial encounter: Secondary | ICD-10-CM

## 2011-12-01 NOTE — Progress Notes (Signed)
  Radiation Oncology         [phone] ________________________________  Name: Shannon [name] MRN: [id number]  Date: 12/01/2011  DOB: 15-Aug-1924  Follow-Up Visit Note  CC: [name], MD  [name], MD  Diagnosis:   Intraductal carcinoma of left breast  Interval Since Last Radiation:   12 years and 1 month  Narrative:  The patient returns today for routine follow-up.  She wishes to be followed for her early-stage breast cancer in light of her anxiety.   she denies any pain in the breast area nipple discharge or bleeding. Patient did undergo mammograms in December of 2012 with no suspicious areas in either breast.                              ALLERGIES:  is allergic to demerol; tequin; and penicillins.  Meds: Current Outpatient Prescriptions  Medication Sig Dispense Refill  . atorvastatin (LIPITOR) 10 MG tablet Take 10 mg by mouth daily.      Marland Kitchen diltiazem (DILACOR XR) 180 MG 24 hr capsule Take 180 mg by mouth daily.      Marland Kitchen levothyroxine (SYNTHROID, LEVOTHROID) 125 MCG tablet Take 1 tablet (125 mcg total) by mouth daily before breakfast.  15 tablet  1  . metoprolol (LOPRESSOR) 50 MG tablet Take 25 mg by mouth 2 (two) times daily.      . propafenone (RYTHMOL) 150 MG tablet Take 150 mg by mouth 3 (three) times daily.      . solifenacin (VESICARE) 10 MG tablet Take 5 mg by mouth daily.      . Vitamin D, Ergocalciferol, (DRISDOL) 50000 UNITS CAPS Take 50,000 Units by mouth every 7 (seven) days.      Marland Kitchen zolpidem (AMBIEN) 5 MG tablet Take 5 mg by mouth at bedtime.        Physical Findings: The patient is in no acute distress. Patient is alert and oriented.  weight is 149 lb 12.8 oz (67.949 kg). Her oral temperature is 97.7 F (36.5 C). Her blood pressure is 139/81 and her pulse is 75. Her respiration is 18. Marland Kitchen  No palpable cervical supraclavicular or axillary adenopathy. Lungs are clear to auscultation. The heart has a regular rhythm and rate. Exam of the  right breast  reveals some fibrocystic changes. There is no dominant mass appreciated breast nipple discharge or bleeding. Examination left breast reveals no palpable or visible signs recurrence. The patient has an excellent cosmetic outcome.  Lab Findings: Lab Results  Component Value Date   WBC 8.9 07/08/2011   HGB 13.4 07/08/2011   HCT 39.2 07/08/2011   MCV 90.5 07/08/2011   PLT 201 07/08/2011    @LASTCHEM @  Radiographic Findings: No results found.  Impression:  The patient is doing well without clinical signs of recurrence.  Mammogram is scheduled for December of this year.  Plan:  Routine followup in one year at the patient's request.  _____________________________________   Billie Lade, PhD, MD

## 2011-12-01 NOTE — Progress Notes (Signed)
HERE TODAY FOR FU OF LEFT BREAST CANCER.  NO C/O TODAY AND SAS SHE IS FEELING FINE.  SKIN LOOKS GREAT!

## 2012-10-14 ENCOUNTER — Ambulatory Visit: Payer: Medicare Other | Admitting: Cardiovascular Disease

## 2012-11-25 ENCOUNTER — Telehealth: Payer: Self-pay | Admitting: Pharmacist Clinician (PhC)/ Clinical Pharmacy Specialist

## 2012-11-26 ENCOUNTER — Encounter: Payer: Self-pay | Admitting: Radiation Oncology

## 2012-11-26 DIAGNOSIS — C801 Malignant (primary) neoplasm, unspecified: Secondary | ICD-10-CM | POA: Insufficient documentation

## 2012-11-29 ENCOUNTER — Ambulatory Visit
Admission: RE | Admit: 2012-11-29 | Discharge: 2012-11-29 | Disposition: A | Discharge status: Home / Self Care | Payer: Medicare Other | Source: Ambulatory Visit | Attending: Radiation Oncology | Admitting: Radiation Oncology

## 2012-11-29 ENCOUNTER — Ambulatory Visit: Payer: Medicare Other | Admitting: Radiation Oncology

## 2012-11-29 ENCOUNTER — Encounter: Payer: Self-pay | Admitting: Radiation Oncology

## 2012-11-29 VITALS — BP 153/77 | HR 73 | Temp 98.4°F | Resp 20 | Wt 152.6 lb

## 2012-11-29 DIAGNOSIS — C50412 Malignant neoplasm of upper-outer quadrant of left female breast: Secondary | ICD-10-CM

## 2012-11-29 NOTE — Progress Notes (Signed)
Pt denies pain, loss of appetite, fatigue, issues w/left breast. She is unsure if she had mammogram since 2012. Pt states she had 3 lesions on her lower left forearm, hand treated by her dermatologist in early July. She states her left hand swelled; she went for FU and was seen by another dermatologist in the office. Pt inquiring if her reaction is related to her previous radiation treatment to her left breast, axilla. Pt has FU w/dermatologist who performed procedure. Enc pt to discuss w/Dr Roselind Messier and to report to her dermatologist during her upcoming FU. Pt verbalized understanding. Pt's left forearm, hand not swollen today.

## 2012-11-29 NOTE — Progress Notes (Signed)
Radiation Oncology         [phone] ________________________________  Name: Shannon [name] MRN: [id number]  Date: 11/29/2012  DOB: [birth year]  Follow-Up Visit Note  CC: Shannon Dubonnet, MD  Shannon Gunning Bryan Lemma, MD  Diagnosis:   Intraductal carcinoma of the left breast  Interval Since Last Radiation:  13 years and one month    Narrative:  The patient returns today for routine follow-up.  She denies any pain within the left breast area nipple discharge or bleeding. She denies any pain in the right breast area nipple discharge or bleeding. Patient has had some skin issues along her left hand. She is unsure if these were skin cancers or not.                              ALLERGIES:  is allergic to demerol; tequin; and penicillins.  Meds: Current Outpatient Prescriptions  Medication Sig Dispense Refill  . atorvastatin (LIPITOR) 10 MG tablet Take 10 mg by mouth daily.      . chlordiazePOXIDE (LIBRIUM) 5 MG capsule       . diltiazem (CARDIZEM CD) 180 MG 24 hr capsule       . diltiazem (DILACOR XR) 180 MG 24 hr capsule Take 180 mg by mouth daily.      . famotidine (PEPCID) 20 MG tablet       . metoprolol (LOPRESSOR) 50 MG tablet Take 25 mg by mouth 2 (two) times daily.      Marland Kitchen MYRBETRIQ 50 MG TB24       . propafenone (RYTHMOL) 150 MG tablet Take 150 mg by mouth 3 (three) times daily.      . solifenacin (VESICARE) 10 MG tablet Take 5 mg by mouth daily.      . TOVIAZ 4 MG TB24       . Vitamin D, Ergocalciferol, (DRISDOL) 50000 UNITS CAPS Take 50,000 Units by mouth every 7 (seven) days.      Marland Kitchen warfarin (COUMADIN) 5 MG tablet       . zolpidem (AMBIEN) 5 MG tablet Take 5 mg by mouth at bedtime.      Marland Kitchen levothyroxine (SYNTHROID, LEVOTHROID) 125 MCG tablet Take 1 tablet (125 mcg total) by mouth daily before breakfast.  15 tablet  1   No current facility-administered medications for this encounter.    Physical Findings: The patient is in no acute distress. Patient is alert and oriented.  weight is 152 lb 9.6 oz (69.219 kg). Her oral temperature is 98.4 F (36.9 C). Her blood pressure is 153/77 and her pulse is 73. Her respiration is 20. Marland Kitchen  No palpable cervical supraclavicular or axillary adenopathy. The lungs are clear to auscultation. The heart has regular rhythm and rate. Examination of the right breast area reveals no mass or nipple discharge. Examination left breast reveals excellent cosmetic result. Her lumpectomy scar is barely visible in the upper outer quadrant. There is no nipple discharge or bleeding noted.  Lab Findings: Lab Results  Component Value Date   WBC 8.9 07/08/2011   HGB 13.4 07/08/2011   HCT 39.2 07/08/2011   MCV 90.5 07/08/2011   PLT 201 07/08/2011      Radiographic Findings: No results found.  Impression:  Intraductal carcinoma of the left breast, no evidence of recurrence on clinical exam today.  Plan:  I discussed with the patient that she does not require a formal followup as long as she follows up with  her primary care physician. Patient however does wish to continue followup in radiation oncology. She'll be seen for followup in one year.  I also discussed with patient that she does not necessarily need formal imaging with her age. She will let me know if she wishes to continue this as part of her followup. _____________________________________ -----------------------------------  Billie Lade, PhD, MD

## 2012-11-30 ENCOUNTER — Ambulatory Visit (INDEPENDENT_AMBULATORY_CARE_PROVIDER_SITE_OTHER): Payer: Medicare Other | Admitting: Pharmacist Clinician (PhC)/ Clinical Pharmacy Specialist

## 2012-11-30 DIAGNOSIS — Z7901 Long term (current) use of anticoagulants: Secondary | ICD-10-CM

## 2012-11-30 DIAGNOSIS — I4891 Unspecified atrial fibrillation: Secondary | ICD-10-CM

## 2013-01-18 ENCOUNTER — Telehealth: Payer: Self-pay | Admitting: Pharmacist Clinician (PhC)/ Clinical Pharmacy Specialist

## 2013-01-18 NOTE — Telephone Encounter (Signed)
Overdue INR letter sent 

## 2013-01-25 ENCOUNTER — Telehealth: Payer: Self-pay | Admitting: Pharmacist Clinician (PhC)/ Clinical Pharmacy Specialist

## 2013-01-25 NOTE — Telephone Encounter (Signed)
Pt was calling in regards to a missed coumadin appointment and would like to talk to you before she is scheduled again.

## 2013-01-25 NOTE — Telephone Encounter (Signed)
appt scheduled for Monday 9/29

## 2013-01-31 ENCOUNTER — Ambulatory Visit (INDEPENDENT_AMBULATORY_CARE_PROVIDER_SITE_OTHER): Payer: Medicare (Managed Care) | Admitting: Pharmacist Clinician (PhC)/ Clinical Pharmacy Specialist

## 2013-01-31 VITALS — BP 142/88 | HR 80

## 2013-01-31 DIAGNOSIS — Z7901 Long term (current) use of anticoagulants: Secondary | ICD-10-CM

## 2013-01-31 DIAGNOSIS — I4891 Unspecified atrial fibrillation: Secondary | ICD-10-CM

## 2013-01-31 LAB — POCT INR: INR: 3.2

## 2013-02-22 ENCOUNTER — Ambulatory Visit (INDEPENDENT_AMBULATORY_CARE_PROVIDER_SITE_OTHER): Payer: Medicare (Managed Care) | Admitting: Pharmacist Clinician (PhC)/ Clinical Pharmacy Specialist

## 2013-02-22 VITALS — BP 160/80

## 2013-02-22 DIAGNOSIS — I4891 Unspecified atrial fibrillation: Secondary | ICD-10-CM

## 2013-02-22 DIAGNOSIS — Z7901 Long term (current) use of anticoagulants: Secondary | ICD-10-CM

## 2013-02-22 LAB — POCT INR: INR: 2

## 2013-03-08 ENCOUNTER — Ambulatory Visit (INDEPENDENT_AMBULATORY_CARE_PROVIDER_SITE_OTHER): Payer: Medicare (Managed Care) | Admitting: Pharmacist Clinician (PhC)/ Clinical Pharmacy Specialist

## 2013-03-08 VITALS — BP 140/82 | HR 68

## 2013-03-08 DIAGNOSIS — Z7901 Long term (current) use of anticoagulants: Secondary | ICD-10-CM

## 2013-03-08 DIAGNOSIS — I4891 Unspecified atrial fibrillation: Secondary | ICD-10-CM

## 2013-03-08 LAB — POCT INR: INR: 3.2

## 2013-03-18 ENCOUNTER — Telehealth: Payer: Self-pay | Admitting: Pharmacist Clinician (PhC)/ Clinical Pharmacy Specialist

## 2013-03-18 NOTE — Telephone Encounter (Signed)
Son needed warfarin dosing to prepare weekly med box.  Gave information to him.  Will email thru [facility] email in future after all appts.

## 2013-03-18 NOTE — Telephone Encounter (Signed)
Please call today if possible.He needs to know how to give her coumadin.

## 2013-04-05 ENCOUNTER — Ambulatory Visit (INDEPENDENT_AMBULATORY_CARE_PROVIDER_SITE_OTHER): Payer: Medicare (Managed Care) | Admitting: Pharmacist Clinician (PhC)/ Clinical Pharmacy Specialist

## 2013-04-05 VITALS — BP 144/78 | HR 76

## 2013-04-05 DIAGNOSIS — I4891 Unspecified atrial fibrillation: Secondary | ICD-10-CM

## 2013-04-05 DIAGNOSIS — Z7901 Long term (current) use of anticoagulants: Secondary | ICD-10-CM

## 2013-04-05 LAB — POCT INR: INR: 2.3

## 2013-05-09 ENCOUNTER — Ambulatory Visit (INDEPENDENT_AMBULATORY_CARE_PROVIDER_SITE_OTHER): Payer: Medicare (Managed Care) | Admitting: Pharmacist Clinician (PhC)/ Clinical Pharmacy Specialist

## 2013-05-09 VITALS — BP 154/80 | HR 68

## 2013-05-09 DIAGNOSIS — Z7901 Long term (current) use of anticoagulants: Secondary | ICD-10-CM

## 2013-05-09 DIAGNOSIS — I4891 Unspecified atrial fibrillation: Secondary | ICD-10-CM

## 2013-05-09 LAB — POCT INR: INR: 2.4

## 2013-05-11 ENCOUNTER — Telehealth: Payer: Self-pay | Admitting: Pharmacist Clinician (PhC)/ Clinical Pharmacy Specialist

## 2013-05-11 NOTE — Telephone Encounter (Signed)
Pt LMOM stating she found a large bruise on her R inner thigh, about the size of "a man's hand".  No idea where it came from.  Please call back.  Called pt Wednesday noon, she states the bruise is tender to touch, but otherwise no pain.  Still mostly purple in color.  I advised her to continue her warfarin (INR check on 1/5 was 2.4) and she could apply heat to the bruise for 5-10 minutes 1-2 times per day, starting tomorrow.  I also reviewed how bruises tend to move with gravity and she should not be alarmed if the bruise appears to get bigger as it moves toward her knee.  Pt voiced understanding.  Will call back if has more concerns.

## 2013-06-06 ENCOUNTER — Ambulatory Visit (INDEPENDENT_AMBULATORY_CARE_PROVIDER_SITE_OTHER): Payer: Medicare (Managed Care) | Admitting: Pharmacist Clinician (PhC)/ Clinical Pharmacy Specialist

## 2013-06-06 VITALS — BP 150/80 | HR 88

## 2013-06-06 DIAGNOSIS — I4891 Unspecified atrial fibrillation: Secondary | ICD-10-CM

## 2013-06-06 DIAGNOSIS — Z7901 Long term (current) use of anticoagulants: Secondary | ICD-10-CM

## 2013-06-06 LAB — POCT INR: INR: 2.4

## 2013-07-04 ENCOUNTER — Ambulatory Visit: Payer: Medicare (Managed Care) | Admitting: Pharmacist Clinician (PhC)/ Clinical Pharmacy Specialist

## 2013-09-07 ENCOUNTER — Telehealth: Payer: Self-pay | Admitting: *Deleted

## 2013-09-07 NOTE — Telephone Encounter (Signed)
Notified by Shannon [name] at [employer] that pt in office requesting to speak w/ someone about shooting pains.  RN spoke w/ pt who c/o shooting pains across the top of her back x "24-36 hours."  Pt denied CP, SOB, numbness or tingling.  Pt stated she took two Tylenol 500 mg.  Pt denied contacting PCP for this.  Stated she thought she should just come up to talk to someone.  Pt informed her symptoms do not sound like they are r/t her heart and advised she contact her PCP for evaluation.  Pt informed RN can schedule an appt in the office, but there are none available before Monday.  Pt verbalized understanding and agreed w/ plan.   Pt will contact PCP for evaluation and call back if needed.  Informed Dr. Loletha Grayer will be notified.  Message forwarded to Dr. Sallyanne Kuster [facility]).

## 2013-11-02 NOTE — Telephone Encounter (Signed)
This encounter was created in error

## 2013-11-28 ENCOUNTER — Ambulatory Visit
Admission: RE | Admit: 2013-11-28 | Payer: Medicare (Managed Care) | Source: Ambulatory Visit | Admitting: Radiation Oncology

## 2013-11-28 ENCOUNTER — Ambulatory Visit: Payer: Medicare Other | Admitting: Radiation Oncology

## 2014-01-19 ENCOUNTER — Ambulatory Visit
Admission: RE | Admit: 2014-01-19 | Payer: Medicare (Managed Care) | Source: Ambulatory Visit | Admitting: Radiation Oncology

## 2014-01-19 ENCOUNTER — Emergency Department (HOSPITAL_COMMUNITY)
Admission: EM | Admit: 2014-01-19 | Discharge: 2014-01-19 | Disposition: A | Discharge status: Home / Self Care | Payer: Medicare (Managed Care) | Attending: Emergency Medicine | Admitting: Emergency Medicine

## 2014-01-19 ENCOUNTER — Encounter (HOSPITAL_COMMUNITY): Payer: Self-pay | Admitting: Emergency Medicine

## 2014-01-19 ENCOUNTER — Emergency Department (HOSPITAL_COMMUNITY): Payer: Medicare (Managed Care)

## 2014-01-19 DIAGNOSIS — Z880 Allergy status to penicillin: Secondary | ICD-10-CM | POA: Insufficient documentation

## 2014-01-19 DIAGNOSIS — Y9389 Activity, other specified: Secondary | ICD-10-CM | POA: Diagnosis not present

## 2014-01-19 DIAGNOSIS — E039 Hypothyroidism, unspecified: Secondary | ICD-10-CM | POA: Insufficient documentation

## 2014-01-19 DIAGNOSIS — Z87891 Personal history of nicotine dependence: Secondary | ICD-10-CM | POA: Diagnosis not present

## 2014-01-19 DIAGNOSIS — IMO0002 Reserved for concepts with insufficient information to code with codable children: Secondary | ICD-10-CM | POA: Diagnosis present

## 2014-01-19 DIAGNOSIS — Z853 Personal history of malignant neoplasm of breast: Secondary | ICD-10-CM | POA: Insufficient documentation

## 2014-01-19 DIAGNOSIS — Z79899 Other long term (current) drug therapy: Secondary | ICD-10-CM | POA: Insufficient documentation

## 2014-01-19 DIAGNOSIS — Z923 Personal history of irradiation: Secondary | ICD-10-CM | POA: Diagnosis not present

## 2014-01-19 DIAGNOSIS — R1031 Right lower quadrant pain: Secondary | ICD-10-CM

## 2014-01-19 DIAGNOSIS — I10 Essential (primary) hypertension: Secondary | ICD-10-CM | POA: Diagnosis not present

## 2014-01-19 DIAGNOSIS — W010XXA Fall on same level from slipping, tripping and stumbling without subsequent striking against object, initial encounter: Secondary | ICD-10-CM | POA: Insufficient documentation

## 2014-01-19 DIAGNOSIS — Z7901 Long term (current) use of anticoagulants: Secondary | ICD-10-CM | POA: Insufficient documentation

## 2014-01-19 DIAGNOSIS — Y9289 Other specified places as the place of occurrence of the external cause: Secondary | ICD-10-CM | POA: Insufficient documentation

## 2014-01-19 DIAGNOSIS — K219 Gastro-esophageal reflux disease without esophagitis: Secondary | ICD-10-CM | POA: Diagnosis not present

## 2014-01-19 DIAGNOSIS — R269 Unspecified abnormalities of gait and mobility: Secondary | ICD-10-CM | POA: Diagnosis not present

## 2014-01-19 LAB — COMPREHENSIVE METABOLIC PANEL
ALK PHOS: 87 U/L (ref 39–117)
ALT: 20 U/L (ref 0–35)
AST: 35 U/L (ref 0–37)
Albumin: 4.1 g/dL (ref 3.5–5.2)
Anion gap: 14 (ref 5–15)
BILIRUBIN TOTAL: 0.5 mg/dL (ref 0.3–1.2)
BUN: 13 mg/dL (ref 6–23)
CHLORIDE: 106 meq/L (ref 96–112)
CO2: 23 mEq/L (ref 19–32)
CREATININE: 0.73 mg/dL (ref 0.50–1.10)
Calcium: 10.5 mg/dL (ref 8.4–10.5)
GFR calc Af Amer: 87 mL/min — ABNORMAL LOW (ref >90)
GFR calc non Af Amer: 75 mL/min — ABNORMAL LOW (ref >90)
Glucose, Bld: 107 mg/dL — ABNORMAL HIGH (ref 70–99)
POTASSIUM: 3.7 meq/L (ref 3.7–5.3)
Sodium: 143 mEq/L (ref 137–147)
Total Protein: 7.3 g/dL (ref 6.0–8.3)

## 2014-01-19 LAB — URINALYSIS, ROUTINE W REFLEX MICROSCOPIC
Bilirubin Urine: NEGATIVE
Glucose, UA: NEGATIVE mg/dL
KETONES UR: NEGATIVE mg/dL
LEUKOCYTES UA: NEGATIVE
NITRITE: NEGATIVE
PH: 7 (ref 5.0–8.0)
Protein, ur: NEGATIVE mg/dL
Specific Gravity, Urine: 1.007 (ref 1.005–1.030)
Urobilinogen, UA: 0.2 mg/dL (ref 0.0–1.0)

## 2014-01-19 LAB — CBC
HCT: 40 % (ref 36.0–46.0)
Hemoglobin: 13.6 g/dL (ref 12.0–15.0)
MCH: 30.8 pg (ref 26.0–34.0)
MCHC: 34 g/dL (ref 30.0–36.0)
MCV: 90.7 fL (ref 78.0–100.0)
PLATELETS: 176 10*3/uL (ref 150–400)
RBC: 4.41 MIL/uL (ref 3.87–5.11)
RDW: 14 % (ref 11.5–15.5)
WBC: 9.5 10*3/uL (ref 4.0–10.5)

## 2014-01-19 LAB — PROTIME-INR
INR: 1.95 — ABNORMAL HIGH (ref 0.00–1.49)
Prothrombin Time: 22.2 seconds — ABNORMAL HIGH (ref 11.6–15.2)

## 2014-01-19 LAB — URINE MICROSCOPIC-ADD ON

## 2014-01-19 NOTE — ED Notes (Addendum)
Per EMS report, pt was getting up out of bed this am in her silky pj's causing her to slip.  Denies LOC.  C/O RT groin pain.  Pt had been lying in same spot about an hour.  Denies neck/back pain &  passed EMS spinal clearance.  Wasn't able to ambulate to stretcher.  EMS ekg was SR, stroke screen was negative. She is being treated for UTI since yesterday.

## 2014-01-19 NOTE — ED Notes (Signed)
Unable to ambulate. Very unsteady upon standing. Unable to take a step

## 2014-01-19 NOTE — Discharge Instructions (Signed)
Fall precautions - use great care and assistance or walker when ambulating. Follow up with primary care doctor in the next couple days.  Also, contact your doctor today and discuss, given your fall risk and impaired mobility, whether or not they want to continue your coumadin treamtent.  Your INR today is 1.95.  Also follow up with home health provider. Return to ER if worse, new symptoms, fevers, weak/faint, severe pain, other concern.    Fall Prevention and Home Safety Falls cause injuries and can affect all age groups. It is possible to use preventive measures to significantly decrease the likelihood of falls. There are many simple measures which can make your home safer and prevent falls. OUTDOORS  Repair cracks and edges of walkways and driveways.  Remove high doorway thresholds.  Trim shrubbery on the main path into your home.  Have good outside lighting.  Clear walkways of tools, rocks, debris, and clutter.  Check that handrails are not broken and are securely fastened. Both sides of steps should have handrails.  Have leaves, snow, and ice cleared regularly.  Use sand or salt on walkways during winter months.  In the garage, clean up grease or oil spills. BATHROOM  Install night lights.  Install grab bars by the toilet and in the tub and shower.  Use non-skid mats or decals in the tub or shower.  Place a plastic non-slip stool in the shower to sit on, if needed.  Keep floors dry and clean up all water on the floor immediately.  Remove soap buildup in the tub or shower on a regular basis.  Secure bath mats with non-slip, double-sided rug tape.  Remove throw rugs and tripping hazards from the floors. BEDROOMS  Install night lights.  Make sure a bedside light is easy to reach.  Do not use oversized bedding.  Keep a telephone by your bedside.  Have a firm chair with side arms to use for getting dressed.  Remove throw rugs and tripping hazards from the  floor. KITCHEN  Keep handles on pots and pans turned toward the center of the stove. Use back burners when possible.  Clean up spills quickly and allow time for drying.  Avoid walking on wet floors.  Avoid hot utensils and knives.  Position shelves so they are not too high or low.  Place commonly used objects within easy reach.  If necessary, use a sturdy step stool with a grab bar when reaching.  Keep electrical cables out of the way.  Do not use floor polish or wax that makes floors slippery. If you must use wax, use non-skid floor wax.  Remove throw rugs and tripping hazards from the floor. STAIRWAYS  Never leave objects on stairs.  Place handrails on both sides of stairways and use them. Fix any loose handrails. Make sure handrails on both sides of the stairways are as long as the stairs.  Check carpeting to make sure it is firmly attached along stairs. Make repairs to worn or loose carpet promptly.  Avoid placing throw rugs at the top or bottom of stairways, or properly secure the rug with carpet tape to prevent slippage. Get rid of throw rugs, if possible.  Have an electrician put in a light switch at the top and bottom of the stairs. OTHER FALL PREVENTION TIPS  Wear low-heel or rubber-soled shoes that are supportive and fit well. Wear closed toe shoes.  When using a stepladder, make sure it is fully opened and both spreaders are firmly locked.  Do not climb a closed stepladder.  Add color or contrast paint or tape to grab bars and handrails in your home. Place contrasting color strips on first and last steps.  Learn and use mobility aids as needed. Install an electrical emergency response system.  Turn on lights to avoid dark areas. Replace light bulbs that burn out immediately. Get light switches that glow.  Arrange furniture to create clear pathways. Keep furniture in the same place.  Firmly attach carpet with non-skid or double-sided tape.  Eliminate uneven  floor surfaces.  Select a carpet pattern that does not visually hide the edge of steps.  Be aware of all pets. OTHER HOME SAFETY TIPS  Set the water temperature for 120 F (48.8 C).  Keep emergency numbers on or near the telephone.  Keep smoke detectors on every level of the home and near sleeping areas. Document Released: 04/11/2002 Document Revised: 10/21/2011 Document Reviewed: 07/11/2011 [facility] Patient Information 2015 [place], [place]. This information is not intended to replace advice given to you by your health care provider. Make sure you discuss any questions you have with your health care provider.    Walker Use HOW TO TELL IF A WALKER IS THE RIGHT SIZE  With your arms hanging at your sides, the walker handles should be at wrist level. If you cannot find the exact fit, choose the height that is most comfortable.  If you have been instructed to not place weight on one of your legs, you may feel more comfortable with a shorter height. If you are using the walker for balance, you may prefer a taller height.  Adjust the height by using the push buttons on the legs of your walker.  In rest position, the back leg of the walkers should be no further ahead than your toes. With your hands resting on the grips, your elbows should be slightly bent at about a 30 degree angle.  Ask your physical therapist or caregiver if you have any concerns. HOW TO USE A STANDARD WALKER (NO WHEELS)  Pick your walker up (do not slide your walker) and place it one step length in front of you. The back legs of the walker should be no further ahead than your toes. You should not feel like you need to lean forward to keep your hands on the grips. As you set the walker down, make sure all 4 leg tips contact the ground at the same time.  Hold onto the walker for support and step forward with your weaker leg into the middle of the walker. Follow the weight bearing instructions your caregiver has given  you.  Push down with your hands and step forward with your stronger leg.  Be careful not to let the walker get too far ahead of you as you walk.  Repeat the process for each step. HOW TO USE A FRONT-WHEELED WALKER  Slide your walker forward. The back legs of the walker should be no further ahead than your toes. You should not feel like you need to lean forward to keep your hands on the grips.  Hold onto the walker for support and step forward with your weaker leg into the middle of the walker. Follow the weight bearing instructions your caregiver has given you.  Push down with your hands and step forward with your stronger leg.  Be careful not to let the walker get too far ahead of you as you walk.  Repeat the process for each step.  If your walker  does not glide well over carpet, consider cutting an "x" in 2 old tennis balls and placing them over the back legs of your walker. STANDING UP FROM A CHAIR WITH ARMRESTS  It is best to sit in a firm chair with armrests.  Position your walker directly in front of your chair. Do not pull on the walker when standing up. It is too unstable to support weight when pulled on.  Slide forward in the chair, with your weaker leg ahead and stronger leg bent near the chair.  Lean forward and push up from your chair with both hands on the armrests. Straighten your stronger leg, rising to standing. Do not pull yourself up from the walker. This may cause it to tip.  When you feel steady on your feet, carefully move one hand at a time to the walker.  Stand for a few seconds to stabilize your balance before you start to walk. STANDING UP FROM A CHAIR WITHOUT ARMRESTS  It is best to sit in a firm chair. A low seat or an overstuffed chair or sofa is hard to get out of.  Place the walker in front of you. Do not pull on the walker when coming to a standing position.  Slide forward in the chair, with your weaker leg ahead and stronger leg bent near the  chair.  Push down on the chair seat with the hand opposite your weaker leg. Keep your other hand on the center of the walker's crossbar.  Stand, steady your balance, and place your hands on the walker handgrips. SITTING DOWN  Always back up toward your chair, using your walker, until you feel the back of your legs touch the chair.  If the chair has armrests, carefully reach back to put your hands on the armrests, and slowly lower your weight.  If the chair does not have armrests, consider backing up to the side of the chair. You can then hold onto the back of the chair and the front of the seat to slowly lower yourself.  You should never feel like you are falling into your chair. USING A WALKER ON STEPS   Before attempting to use your walker on steps, practice with your physical therapist.  If you are going up a step wide enough to accommodate the entire walker and yourself:  First, place the walker up on the step.  Second, get your feet as close to the step as you can.  Third, press down on the walker with your hands as you step up with your stronger leg. Then step up with your weaker leg.  If you are going down a step wide enough to accommodate the entire walker and yourself:  First, place the walker down on the step.  Second, hold onto the walker as you step down with your weaker leg. Then step down with your stronger leg.  If you are going up more than 1 step and have a railing:  First, turn the walker sideways, so the opening is facing in toward you.  Second, place the front 2 legs of the walker on the first step. These front legs should be positioned at the base of the next step.  Third, test the steadiness of the walker. It should feel sturdy when you press down on the handgrip that is facing the top of the steps.  Finally, placing your weight on the railing and the walker, step up with your stronger leg first. Then step up with your weaker leg.  If you are going down  more than 1 step and have a railing:  First, turn the walker sideways, so the opening is facing in toward you.  Second, place the front 2 legs of the walker down on the first step. When possible, the back legs of the walker should be positioned at the base of the previous step.  Third, test the steadiness of the walker. It should feel sturdy when you press down on the handgrip that is facing the top of the steps.  Finally, placing your weight on the railing and the walker, step down with your weaker leg first. Then step down with your stronger leg.  Be sure to check the sturdiness of the walker before each step.  Make sure you have good rubber tips on the legs of your walker to prevent it from slipping. Document Released: 04/21/2005 Document Revised: 07/14/2011 Document Reviewed: 10/29/2010 [facility] Patient Information 2015 [place], [place]. This information is not intended to replace advice given to you by your health care provider. Make sure you discuss any questions you have with your health care provider.

## 2014-01-19 NOTE — ED Provider Notes (Signed)
CSN: [id number]     Arrival date & time 01/19/14  1132 History   First MD Initiated Contact with Patient 01/19/14 1143     Chief Complaint  Patient presents with  . Fall  . Groin Pain     (Consider location/radiation/quality/duration/timing/severity/associated sxs/prior Treatment) Patient is a 80 y.o. female presenting with fall and groin pain. The history is provided by the patient.  Fall Pertinent negatives include no chest pain, no abdominal pain, no headaches and no shortness of breath.  Groin Pain Pertinent negatives include no chest pain, no abdominal pain, no headaches and no shortness of breath.  pt c/o slip/trip and fall this morning, and right groin pain post fall.  States was wear silk pajamas which caused her to slip.  Denies any faintness or dizziness prior to fall, or since. Denies any head contusion or headache. No neck or back pain. Is on coumadin on chronic basis, no recent abn bruising or bleeding. Right groin pain mild, worse w movement hip or weight bearing.  No radicular pain. No back or neck pain. Skin intact, no abrasions or lacs, no bruising.      Past Medical History  Diagnosis Date  . GERD (gastroesophageal reflux disease)   . Hypertension   . Hypothyroidism   . Cancer     left breast  . Hx of radiation therapy 2001    left breast   Past Surgical History  Procedure Laterality Date  . Breast surgery     History reviewed. No pertinent family history. History  Substance Use Topics  . Smoking status: Former Smoker    Quit date: 05/05/1968  . Smokeless tobacco: Never Used  . Alcohol Use: No   OB History   Grav Para Term Preterm Abortions TAB SAB Ect Mult Living                 Review of Systems  Constitutional: Negative for fever and chills.  HENT: Negative for nosebleeds.   Eyes: Negative for redness.  Respiratory: Negative for shortness of breath.   Cardiovascular: Negative for chest pain.  Gastrointestinal: Negative for nausea, vomiting  and abdominal pain.  Genitourinary: Negative for dysuria and flank pain.  Musculoskeletal: Negative for back pain and neck pain.  Skin: Negative for wound.  Neurological: Negative for syncope, weakness, numbness and headaches.  Hematological:       No abn bruising or bleeding.   Psychiatric/Behavioral: Negative for confusion.      Allergies  Demerol; Tequin; and Penicillins  Home Medications   Prior to Admission medications   Medication Sig Start Date End Date Taking? Authorizing Provider  atorvastatin (LIPITOR) 10 MG tablet Take 10 mg by mouth daily.    Historical Provider, MD  chlordiazePOXIDE (LIBRIUM) 5 MG capsule  10/19/12   Historical Provider, MD  diltiazem (CARDIZEM CD) 180 MG 24 hr capsule  11/10/12   Historical Provider, MD  diltiazem (DILACOR XR) 180 MG 24 hr capsule Take 180 mg by mouth daily.    Historical Provider, MD  famotidine (PEPCID) 20 MG tablet  11/27/12   Historical Provider, MD  levothyroxine (SYNTHROID, LEVOTHROID) 125 MCG tablet Take 1 tablet (125 mcg total) by mouth daily before breakfast. 07/08/11 07/07/12  Josetta Huddle, MD  metoprolol (LOPRESSOR) 50 MG tablet Take 25 mg by mouth 2 (two) times daily.    Historical Provider, MD  MYRBETRIQ 50 MG TB24  11/10/12   Historical Provider, MD  propafenone (RYTHMOL) 150 MG tablet Take 150 mg by mouth 3 (three) times  daily.    Historical Provider, MD  solifenacin (VESICARE) 10 MG tablet Take 5 mg by mouth daily.    Historical Provider, MD  TOVIAZ 4 MG TB24  11/12/12   Historical Provider, MD  Vitamin D, Ergocalciferol, (DRISDOL) 50000 UNITS CAPS Take 50,000 Units by mouth every 7 (seven) days.    Historical Provider, MD  warfarin (COUMADIN) 5 MG tablet  10/23/12   Historical Provider, MD  zolpidem (AMBIEN) 5 MG tablet Take 5 mg by mouth at bedtime.    Historical Provider, MD   BP 109/87  Pulse 78  Temp(Src) 98.1 F (36.7 C) (Oral)  Resp 14  SpO2 97% Physical Exam  Nursing note and vitals reviewed. Constitutional: She is  oriented to person, place, and time. She appears well-developed and well-nourished. No distress.  HENT:  Head: Atraumatic.  No facial or scalp pain, tenderness, sts, or bruising.   Eyes: Conjunctivae are normal. Pupils are equal, round, and reactive to light. No scleral icterus.  Neck: Normal range of motion. Neck supple. No tracheal deviation present.  Cardiovascular: Normal rate, normal heart sounds and intact distal pulses.   Pulmonary/Chest: Effort normal and breath sounds normal. No respiratory distress. She exhibits no tenderness.  Abdominal: Soft. Normal appearance. She exhibits no distension. There is no tenderness.  Musculoskeletal: She exhibits no edema.  CTLS spine, non tender, aligned, no step off. Good rom bil ext without pain or focal bony tenderness. Distal pulses palp.   Neurological: She is alert and oriented to person, place, and time.  Motor intact bil. stre 5/5, sens intact.   Skin: Skin is warm and dry. No rash noted.  Psychiatric: She has a normal mood and affect.    ED Course  Procedures (including critical care time) Labs Review  Results for orders placed during the hospital encounter of 01/19/14  PROTIME-INR      Result Value Ref Range   Prothrombin Time 22.2 (*) 11.6 - 15.2 seconds   INR 1.95 (*) 0.00 - 1.49  CBC      Result Value Ref Range   WBC 9.5  4.0 - 10.5 K/uL   RBC 4.41  3.87 - 5.11 MIL/uL   Hemoglobin 13.6  12.0 - 15.0 g/dL   HCT 40.0  36.0 - 46.0 %   MCV 90.7  78.0 - 100.0 fL   MCH 30.8  26.0 - 34.0 pg   MCHC 34.0  30.0 - 36.0 g/dL   RDW 14.0  11.5 - 15.5 %   Platelets 176  150 - 400 K/uL  COMPREHENSIVE METABOLIC PANEL      Result Value Ref Range   Sodium 143  137 - 147 mEq/L   Potassium 3.7  3.7 - 5.3 mEq/L   Chloride 106  96 - 112 mEq/L   CO2 23  19 - 32 mEq/L   Glucose, Bld 107 (*) 70 - 99 mg/dL   BUN 13  6 - 23 mg/dL   Creatinine, Ser 0.73  0.50 - 1.10 mg/dL   Calcium 10.5  8.4 - 10.5 mg/dL   Total Protein 7.3  6.0 - 8.3 g/dL    Albumin 4.1  3.5 - 5.2 g/dL   AST 35  0 - 37 U/L   ALT 20  0 - 35 U/L   Alkaline Phosphatase 87  39 - 117 U/L   Total Bilirubin 0.5  0.3 - 1.2 mg/dL   GFR calc non Af Amer 75 (*) >90 mL/min   GFR calc Af Amer 87 (*) >90  mL/min   Anion gap 14  5 - 15  URINALYSIS, ROUTINE W REFLEX MICROSCOPIC      Result Value Ref Range   Color, Urine YELLOW  YELLOW   APPearance CLEAR  CLEAR   Specific Gravity, Urine 1.007  1.005 - 1.030   pH 7.0  5.0 - 8.0   Glucose, UA NEGATIVE  NEGATIVE mg/dL   Hgb urine dipstick TRACE (*) NEGATIVE   Bilirubin Urine NEGATIVE  NEGATIVE   Ketones, ur NEGATIVE  NEGATIVE mg/dL   Protein, ur NEGATIVE  NEGATIVE mg/dL   Urobilinogen, UA 0.2  0.0 - 1.0 mg/dL   Nitrite NEGATIVE  NEGATIVE   Leukocytes, UA NEGATIVE  NEGATIVE  URINE MICROSCOPIC-ADD ON      Result Value Ref Range   Squamous Epithelial / LPF RARE  RARE   RBC / HPF 0-2  <3 RBC/hpf   Dg Hip Complete Right  01/19/2014   CLINICAL DATA:  Fall, right hip pain  EXAM: RIGHT HIP - COMPLETE 2+ VIEW  COMPARISON:  None.  FINDINGS: No evidence of acute fracture or malalignment. Mild bilateral hip joint osteoarthritis with osteophyte formation along the inferior acetabuli bilaterally. Degenerative disc disease at L4-L5. Normal bony mineralization without lytic or blastic osseous lesion.  IMPRESSION: 1. No acute fracture or malalignment. 2. Symmetric bilateral mild hip joint osteoarthritis.   Electronically Signed   By: Jacqulynn Cadet M.D.   On: 01/19/2014 12:35   Ct Head Wo Contrast  01/19/2014   CLINICAL DATA:  Dizziness with standing ; fall today with possible head trauma, now with headache  EXAM: CT HEAD WITHOUT CONTRAST  TECHNIQUE: Contiguous axial images were obtained from the base of the skull through the vertex without intravenous contrast.  COMPARISON:  Noncontrast CT scan of the brain of Sep 03, 2011  FINDINGS: There is moderate ventriculomegaly which is stable. There is mild diffuse cerebral atrophy. There is no  shift of the midline. There is no intracranial hemorrhage nor intracranial mass effect. There is decreased density in the deep white matter of both cerebral hemispheres consistent with chronic small vessel ischemia. An old lacunar infarction in the insula on the right is noted. The cerebellum and brainstem are unremarkable.  The observed paranasal sinuses and mastoid air cells are clear. There is no acute skull fracture. No cephalohematoma is demonstrated.  IMPRESSION: There is no acute intracranial hemorrhage nor other acute intracranial abnormality. Significant chronic change is present as described.   Electronically Signed   By: David  Martinique   On: 01/19/2014 14:28       Date: 01/19/2014  Rate: 80  Rhythm: normal sinus rhythm  QRS Axis: left  Intervals: normal  ST/T Wave abnormalities: nonspecific ST/T changes  Conduction Disutrbances:IRBBB  Narrative Interpretation:   Old EKG Reviewed: unchanged    MDM  Xrays.  Reviewed nursing notes and prior charts for additional history.   Pt ambulates very slowly w shuffling gait.  Family states gait c/w prior.  Pt currently doesn't use walker or cane, and lives independently w spouse.  No fevers. Pt is eating and drinking. Spine nt. Pain controlled. Ambulates w no hip pain.  Case manager consulted re home health eval, walker, PT.  Pt requests d/c to home, feels ready for d/c.    Mirna Mires, MD 01/19/14 1520

## 2014-01-19 NOTE — Progress Notes (Signed)
  CARE MANAGEMENT ED NOTE 01/19/2014  Patient:  Shannon [name], Shannon [name]   Account Number:  [id number]  Date Initiated:  01/19/2014  Documentation initiated by:  Livia Snellen  Subjective/Objective Assessment:   Patient presents to Ed with post fall at home.     Subjective/Objective Assessment Detail:   Patient with pmhx of HTN, Hypothyroid,breast cancer, GERD. Patient was admitted to [facility] rehab in 2013 for six weeks for short term rehab.     Action/Plan:   Patient to be discharged with home health services   Action/Plan Detail:   Anticipated DC Date:  01/19/2014     Status Recommendation to Physician:   Result of Recommendation:    Other ED Services  Consult Working [place]  CM consult  Other   [place]   Choice offered to / List presented to:  C-1 Patient  DME arranged  [place]     DME agency  [place] arranged  HH-1 RN  [place]      [facility].    Status of service:  In process, will continue to follow  ED Comments:   ED Comments Detail:  [facility] consulted by EDP for home health services.  EDCM spoke to patient and her family at bedside.  Patient lives at home with her husband. Patient does not have any home health services or medical equipment at home at this time. Patient's daughter in law reports patient thew patient has a stair lift, safety rails in bathroom, safety rail in the hallway and a shower seat at home.  EDCM provided patient with list of homehealth agencies in [place] of which [facility] was chosen.  EDCM spoke to [place], transition care specialist for [facility] at 1547pm who has received the orders for home health with face to face. EDCM called [place], DME specialist for [facility] at 1541pm who will bring bedside commode and rolling walker to ED. Patient confirms best phone  number to reach her is [phone] and address is [address] in [place]. EDCM informed patient's family that [facility] has 24-48 hours to contact the patient.  Phone number for [facility] given to patient.   Patient and patient's family thankful for services.  No further EDCM needs at this time.

## 2014-01-19 NOTE — Progress Notes (Signed)
[facility]  [facility] is providing the following services: RW and [facility] (delivered to hospital room)  If patient discharges after hours, please call [phone].   Linward Headland 01/19/2014, 3:57 PM

## 2014-01-19 NOTE — ED Notes (Signed)
Bed: WA06 Expected date:  Expected time:  Means of arrival:  Comments: EMS 

## 2014-01-24 ENCOUNTER — Telehealth (HOSPITAL_BASED_OUTPATIENT_CLINIC_OR_DEPARTMENT_OTHER): Payer: Self-pay | Admitting: Emergency Medicine

## 2014-09-23 ENCOUNTER — Encounter (HOSPITAL_COMMUNITY): Payer: Self-pay

## 2014-09-23 ENCOUNTER — Emergency Department (HOSPITAL_COMMUNITY): Payer: Medicare Other

## 2014-09-23 ENCOUNTER — Inpatient Hospital Stay (HOSPITAL_COMMUNITY)
Admission: EM | Admit: 2014-09-23 | Discharge: 2014-09-26 | DRG: 481 | Disposition: A | Discharge status: Skilled Nursing Facility | Payer: Medicare Other | Attending: Internal Medicine | Admitting: Internal Medicine

## 2014-09-23 DIAGNOSIS — W109XXA Fall (on) (from) unspecified stairs and steps, initial encounter: Secondary | ICD-10-CM | POA: Diagnosis present

## 2014-09-23 DIAGNOSIS — Z923 Personal history of irradiation: Secondary | ICD-10-CM

## 2014-09-23 DIAGNOSIS — Z87891 Personal history of nicotine dependence: Secondary | ICD-10-CM | POA: Diagnosis not present

## 2014-09-23 DIAGNOSIS — Z7901 Long term (current) use of anticoagulants: Secondary | ICD-10-CM

## 2014-09-23 DIAGNOSIS — Z853 Personal history of malignant neoplasm of breast: Secondary | ICD-10-CM | POA: Diagnosis not present

## 2014-09-23 DIAGNOSIS — D699 Hemorrhagic condition, unspecified: Secondary | ICD-10-CM | POA: Diagnosis not present

## 2014-09-23 DIAGNOSIS — I480 Paroxysmal atrial fibrillation: Secondary | ICD-10-CM | POA: Diagnosis present

## 2014-09-23 DIAGNOSIS — I10 Essential (primary) hypertension: Secondary | ICD-10-CM | POA: Diagnosis present

## 2014-09-23 DIAGNOSIS — I482 Chronic atrial fibrillation: Secondary | ICD-10-CM

## 2014-09-23 DIAGNOSIS — Z79899 Other long term (current) drug therapy: Secondary | ICD-10-CM | POA: Diagnosis not present

## 2014-09-23 DIAGNOSIS — I4891 Unspecified atrial fibrillation: Secondary | ICD-10-CM | POA: Diagnosis not present

## 2014-09-23 DIAGNOSIS — D6832 Hemorrhagic disorder due to extrinsic circulating anticoagulants: Secondary | ICD-10-CM | POA: Diagnosis present

## 2014-09-23 DIAGNOSIS — R791 Abnormal coagulation profile: Secondary | ICD-10-CM | POA: Diagnosis present

## 2014-09-23 DIAGNOSIS — T45515A Adverse effect of anticoagulants, initial encounter: Secondary | ICD-10-CM

## 2014-09-23 DIAGNOSIS — W19XXXA Unspecified fall, initial encounter: Secondary | ICD-10-CM

## 2014-09-23 DIAGNOSIS — Y92009 Unspecified place in unspecified non-institutional (private) residence as the place of occurrence of the external cause: Secondary | ICD-10-CM | POA: Diagnosis not present

## 2014-09-23 DIAGNOSIS — D62 Acute posthemorrhagic anemia: Secondary | ICD-10-CM | POA: Diagnosis not present

## 2014-09-23 DIAGNOSIS — K219 Gastro-esophageal reflux disease without esophagitis: Secondary | ICD-10-CM | POA: Diagnosis present

## 2014-09-23 DIAGNOSIS — S72009A Fracture of unspecified part of neck of unspecified femur, initial encounter for closed fracture: Secondary | ICD-10-CM

## 2014-09-23 DIAGNOSIS — S72001A Fracture of unspecified part of neck of right femur, initial encounter for closed fracture: Secondary | ICD-10-CM | POA: Diagnosis present

## 2014-09-23 DIAGNOSIS — M25551 Pain in right hip: Secondary | ICD-10-CM | POA: Diagnosis present

## 2014-09-23 DIAGNOSIS — S72141A Displaced intertrochanteric fracture of right femur, initial encounter for closed fracture: Secondary | ICD-10-CM | POA: Diagnosis present

## 2014-09-23 DIAGNOSIS — E785 Hyperlipidemia, unspecified: Secondary | ICD-10-CM | POA: Diagnosis present

## 2014-09-23 DIAGNOSIS — E039 Hypothyroidism, unspecified: Secondary | ICD-10-CM | POA: Diagnosis not present

## 2014-09-23 LAB — COMPREHENSIVE METABOLIC PANEL
ALK PHOS: 73 U/L (ref 38–126)
ALT: 19 U/L (ref 14–54)
AST: 25 U/L (ref 15–41)
Albumin: 3.9 g/dL (ref 3.5–5.0)
Anion gap: 10 (ref 5–15)
BILIRUBIN TOTAL: 0.7 mg/dL (ref 0.3–1.2)
BUN: 17 mg/dL (ref 6–20)
CALCIUM: 9.8 mg/dL (ref 8.9–10.3)
CHLORIDE: 104 mmol/L (ref 101–111)
CO2: 24 mmol/L (ref 22–32)
Creatinine, Ser: 0.67 mg/dL (ref 0.44–1.00)
Glucose, Bld: 103 mg/dL — ABNORMAL HIGH (ref 65–99)
POTASSIUM: 4.3 mmol/L (ref 3.5–5.1)
Sodium: 138 mmol/L (ref 135–145)
TOTAL PROTEIN: 6.9 g/dL (ref 6.5–8.1)

## 2014-09-23 LAB — URINALYSIS, ROUTINE W REFLEX MICROSCOPIC
Bilirubin Urine: NEGATIVE
GLUCOSE, UA: NEGATIVE mg/dL
Ketones, ur: NEGATIVE mg/dL
Leukocytes, UA: NEGATIVE
Nitrite: POSITIVE — AB
PROTEIN: NEGATIVE mg/dL
SPECIFIC GRAVITY, URINE: 1.017 (ref 1.005–1.030)
UROBILINOGEN UA: 0.2 mg/dL (ref 0.0–1.0)
pH: 6 (ref 5.0–8.0)

## 2014-09-23 LAB — CBC WITH DIFFERENTIAL/PLATELET
Basophils Absolute: 0 10*3/uL (ref 0.0–0.1)
Basophils Relative: 0 % (ref 0–1)
Eosinophils Absolute: 0.2 10*3/uL (ref 0.0–0.7)
Eosinophils Relative: 3 % (ref 0–5)
HCT: 40.7 % (ref 36.0–46.0)
Hemoglobin: 13.1 g/dL (ref 12.0–15.0)
LYMPHS PCT: 20 % (ref 12–46)
Lymphs Abs: 1.3 10*3/uL (ref 0.7–4.0)
MCH: 30 pg (ref 26.0–34.0)
MCHC: 32.2 g/dL (ref 30.0–36.0)
MCV: 93.1 fL (ref 78.0–100.0)
MONO ABS: 0.8 10*3/uL (ref 0.1–1.0)
MONOS PCT: 13 % — AB (ref 3–12)
NEUTROS ABS: 4.2 10*3/uL (ref 1.7–7.7)
Neutrophils Relative %: 64 % (ref 43–77)
PLATELETS: 192 10*3/uL (ref 150–400)
RBC: 4.37 MIL/uL (ref 3.87–5.11)
RDW: 14.4 % (ref 11.5–15.5)
WBC: 6.5 10*3/uL (ref 4.0–10.5)

## 2014-09-23 LAB — URINE MICROSCOPIC-ADD ON

## 2014-09-23 LAB — PROTIME-INR
INR: 2.23 — ABNORMAL HIGH (ref 0.00–1.49)
Prothrombin Time: 24.5 seconds — ABNORMAL HIGH (ref 11.6–15.2)

## 2014-09-23 MED ORDER — DILTIAZEM HCL ER COATED BEADS 180 MG PO CP24
180.0000 mg | ORAL_CAPSULE | Freq: Every day | ORAL | Status: DC
Start: 2014-09-24 — End: 2014-09-26
  Administered 2014-09-24 – 2014-09-26 (×3): 180 mg via ORAL
  Filled 2014-09-23 (×3): qty 1

## 2014-09-23 MED ORDER — HYDROCODONE-ACETAMINOPHEN 5-325 MG PO TABS: 1.0000 | ORAL_TABLET | Freq: Four times a day (QID) | ORAL | Status: DC | PRN

## 2014-09-23 MED ORDER — VITAMIN B-12 1000 MCG PO TABS
1000.0000 ug | ORAL_TABLET | Freq: Every day | ORAL | Status: DC
  Administered 2014-09-25 – 2014-09-26 (×2): 1000 ug via ORAL
  Filled 2014-09-23 (×3): qty 1

## 2014-09-23 MED ORDER — LEVOTHYROXINE SODIUM 125 MCG PO TABS
125.0000 ug | ORAL_TABLET | Freq: Every day | ORAL | Status: DC
  Administered 2014-09-25 – 2014-09-26 (×2): 125 ug via ORAL
  Filled 2014-09-23 (×4): qty 1

## 2014-09-23 MED ORDER — DOCUSATE SODIUM 100 MG PO CAPS: 100.0000 mg | ORAL_CAPSULE | Freq: Two times a day (BID) | ORAL | Status: DC | PRN

## 2014-09-23 MED ORDER — FENTANYL CITRATE (PF) 100 MCG/2ML IJ SOLN
50.0000 ug | INTRAMUSCULAR | Status: DC | PRN
  Administered 2014-09-23 (×2): 50 ug via INTRAVENOUS
  Filled 2014-09-23 (×2): qty 2

## 2014-09-23 MED ORDER — ATORVASTATIN CALCIUM 10 MG PO TABS
10.0000 mg | ORAL_TABLET | Freq: Every day | ORAL | Status: DC
Start: 2014-09-23 — End: 2014-09-26
  Administered 2014-09-24 – 2014-09-25 (×3): 10 mg via ORAL
  Filled 2014-09-23 (×4): qty 1

## 2014-09-23 MED ORDER — MORPHINE SULFATE 2 MG/ML IJ SOLN: 0.5000 mg | INTRAMUSCULAR | Status: DC | PRN

## 2014-09-23 MED ORDER — PANTOPRAZOLE SODIUM 40 MG PO TBEC
40.0000 mg | DELAYED_RELEASE_TABLET | Freq: Every day | ORAL | Status: DC
  Administered 2014-09-25 – 2014-09-26 (×2): 40 mg via ORAL
  Filled 2014-09-23 (×3): qty 1

## 2014-09-23 MED ORDER — VITAMIN K1 10 MG/ML IJ SOLN
5.0000 mg | Freq: Once | INTRAVENOUS | Status: AC
  Administered 2014-09-24: 5 mg via INTRAVENOUS
  Filled 2014-09-23: qty 0.5

## 2014-09-23 MED ORDER — PROPAFENONE HCL 150 MG PO TABS
150.0000 mg | ORAL_TABLET | Freq: Three times a day (TID) | ORAL | Status: DC
  Administered 2014-09-24 – 2014-09-26 (×7): 150 mg via ORAL
  Filled 2014-09-23 (×12): qty 1

## 2014-09-23 MED ORDER — FAMOTIDINE 20 MG PO TABS: 20.0000 mg | ORAL_TABLET | ORAL | Status: DC

## 2014-09-23 MED ORDER — TRIMETHOPRIM 100 MG PO TABS
100.0000 mg | ORAL_TABLET | Freq: Every day | ORAL | Status: DC
  Administered 2014-09-24: 100 mg via ORAL
  Filled 2014-09-23 (×2): qty 1

## 2014-09-23 MED ORDER — VITAMIN D3 25 MCG (1000 UNIT) PO TABS
2000.0000 [IU] | ORAL_TABLET | Freq: Every day | ORAL | Status: DC
  Administered 2014-09-25 – 2014-09-26 (×2): 2000 [IU] via ORAL
  Filled 2014-09-23 (×3): qty 2

## 2014-09-23 MED ORDER — MIRABEGRON ER 50 MG PO TB24
50.0000 mg | ORAL_TABLET | Freq: Every day | ORAL | Status: DC
  Administered 2014-09-25 – 2014-09-26 (×2): 50 mg via ORAL
  Filled 2014-09-23 (×3): qty 1

## 2014-09-23 MED ORDER — HYDROMORPHONE HCL 1 MG/ML IJ SOLN: 0.5000 mg | Freq: Once | INTRAMUSCULAR | Status: DC

## 2014-09-23 MED ORDER — CHLORDIAZEPOXIDE HCL 5 MG PO CAPS: 5.0000 mg | ORAL_CAPSULE | Freq: Two times a day (BID) | ORAL | Status: DC | PRN

## 2014-09-23 NOTE — ED Notes (Signed)
Patient coming down steps at her home this evening at 1850 and fell on front porch.  Patient with obvious deformity to right thigh area with external rotation noted.  Patient is on Coumadin.  Fentanyl 250 mcg administered IV per EMS/IV right ACF-#20 angio.  No LOC-patient stated she did not hit head.

## 2014-09-23 NOTE — ED Provider Notes (Signed)
CSN: [id number]     Arrival date & time 09/23/14  1952 History   First MD Initiated Contact with Patient 09/23/14 2003     Chief Complaint  Patient presents with  . Fall  . Leg Injury     (Consider location/radiation/quality/duration/timing/severity/associated sxs/prior Treatment) HPI  Shannon [name] is a pleasant 81 year old female with history of A. fib and chronic warfarin therapy who slipped and fell tonight on her front porch.  After her son and husband were unable to help her up, and she was unable to bear weight, EMS was called, who then transferred her to [facility].  Her right leg and hip is visibly deformed, shortened and externally rotated.  She denies hitting her head when she fell and has no other complaints other then pain in her right hip with movement.  Denies loss of consciousness.  She does not recall the last time she went to the coumadin clinic to check INR.  She denies chest pain, shortness of breath, and syncope.  She has no recent bruising, bleeding and has no skin tears.   Past Medical History  Diagnosis Date  . GERD (gastroesophageal reflux disease)   . Hypertension   . Hypothyroidism   . Cancer     left breast  . Hx of radiation therapy 2001    left breast   Past Surgical History  Procedure Laterality Date  . Breast surgery     No family history on file. History  Substance Use Topics  . Smoking status: Former Smoker    Quit date: 05/05/1968  . Smokeless tobacco: Never Used  . Alcohol Use: No   OB History    No data available     Review of Systems  All other systems reviewed and are negative.     Allergies  Demerol; Keflet; Sulfa antibiotics; Tequin; and Penicillins  Home Medications   Prior to Admission medications   Medication Sig Start Date End Date Taking? Authorizing Provider  atorvastatin (LIPITOR) 10 MG tablet Take 10 mg by mouth daily.   Yes Historical Provider, MD  chlordiazePOXIDE (LIBRIUM) 5 MG capsule Take 1 capsule by  mouth 2 (two) times daily as needed for anxiety.  09/08/14  Yes Historical Provider, MD  Cholecalciferol (VITAMIN D) 2000 UNITS tablet Take 2,000 Units by mouth daily.   Yes Historical Provider, MD  [employer] STOOL SOFTENER 100 MG capsule Take 1 capsule by mouth 2 (two) times daily as needed for moderate constipation.  06/28/14  Yes Historical Provider, MD  diltiazem (CARDIZEM CD) 180 MG 24 hr capsule 180 mg daily.  11/10/12  Yes Historical Provider, MD  famotidine (PEPCID) 20 MG tablet 20 mg See admin instructions. Takes 1 on Sunday, Tuesday, Thursday and Saturday. 11/27/12  Yes Historical Provider, MD  furosemide (LASIX) 20 MG tablet Take 20 mg by mouth daily as needed (leg swelling).    Yes Historical Provider, MD  levothyroxine (SYNTHROID, LEVOTHROID) 125 MCG tablet Take 1 tablet (125 mcg total) by mouth daily before breakfast. 07/08/11 09/23/14 Yes Josetta Huddle, MD  MYRBETRIQ 50 MG TB24 Take 50 mg by mouth daily.  11/10/12  Yes Historical Provider, MD  pantoprazole (PROTONIX) 40 MG tablet Take 40 mg by mouth daily.   Yes Historical Provider, MD  propafenone (RYTHMOL) 150 MG tablet Take 150 mg by mouth 3 (three) times daily.   Yes Historical Provider, MD  trimethoprim (TRIMPEX) 100 MG tablet Take 100 mg by mouth at bedtime.   Yes Historical Provider, MD  vitamin B-12 (  CYANOCOBALAMIN) 1000 MCG tablet Take 1,000 mcg by mouth daily.   Yes Historical Provider, MD  warfarin (COUMADIN) 5 MG tablet 2.5-5 mg daily. Alternates between 2.5mg  (half-tab) and 5mg  daily.  Sunday, Tuesday, Thursday- 2.5mg  Monday, Wednesday, Friday, Saturday- 5mg  10/23/12  Yes Historical Provider, MD  zolpidem (AMBIEN) 5 MG tablet Take 5 mg by mouth at bedtime as needed for sleep.    Yes Historical Provider, MD   BP 131/94 mmHg  Pulse 77  Temp(Src) 97.9 F (36.6 C) (Oral)  Resp 15  Ht 5\' 3"  (1.6 m)  Wt 150 lb (68.04 kg)  BMI 26.58 kg/m2  SpO2 92% Physical Exam  Constitutional: She is oriented to person, place, and time. Vital signs are  normal. She appears well-developed and well-nourished.  In mild discomfort  HENT:  Head: Normocephalic and atraumatic.  Nose: Nose normal.  Mouth/Throat: Oropharynx is clear and moist. No oropharyngeal exudate.  Eyes: Conjunctivae and EOM are normal. Pupils are equal, round, and reactive to light. Right eye exhibits no discharge. Left eye exhibits no discharge. No scleral icterus.  Neck: Normal range of motion. No JVD present. No tracheal deviation present. No thyromegaly present.  Cardiovascular: Normal rate, regular rhythm and normal heart sounds.  Exam reveals no gallop and no friction rub.   No murmur heard. Pulses:      Posterior tibial pulses are 2+ on the right side, and 2+ on the left side.  Pulmonary/Chest: Effort normal and breath sounds normal. No respiratory distress. She has no wheezes. She has no rales. She exhibits no tenderness.  Abdominal: Soft. Bowel sounds are normal. She exhibits no distension and no mass. There is no tenderness. There is no rebound and no guarding.  Musculoskeletal:       Right hip: She exhibits tenderness, bony tenderness, swelling and deformity. She exhibits normal range of motion, normal strength, no crepitus and no laceration.  Lymphadenopathy:    She has no cervical adenopathy.  Neurological: She is alert and oriented to person, place, and time. She has normal reflexes. She exhibits normal muscle tone. Coordination normal.  Normal sensation RLE  Skin: Skin is warm and dry. No rash noted. She is not diaphoretic. No erythema. No pallor.  Psychiatric: She has a normal mood and affect. Her behavior is normal. Judgment and thought content normal.  Nursing note and vitals reviewed.      ED Course  Procedures (including critical care time) Labs Review Labs Reviewed  CBC WITH DIFFERENTIAL/PLATELET - Abnormal; Notable for the following:    Monocytes Relative 13 (*)    All other components within normal limits  COMPREHENSIVE METABOLIC PANEL -  Abnormal; Notable for the following:    Glucose, Bld 103 (*)    All other components within normal limits  PROTIME-INR - Abnormal; Notable for the following:    Prothrombin Time 24.5 (*)    INR 2.23 (*)    All other components within normal limits  URINALYSIS, ROUTINE W REFLEX MICROSCOPIC  TYPE AND SCREEN  ABO/RH    Imaging Review Dg Femur Port, Min 2 Views Right  09/23/2014   CLINICAL DATA:  Right hip pain, fell coming down steps.  EXAM: RIGHT FEMUR PORTABLE 1 VIEW  COMPARISON:  Right hip series.  FINDINGS: There is a comminuted right femoral intertrochanteric fracture with varus angulation. No additional femoral abnormality. No subluxation or dislocation. Mild degenerative changes in the hips bilaterally.  IMPRESSION: Comminuted, angulated right femoral intertrochanteric fracture.   Electronically Signed   By: Rolm Baptise  M.D.   On: 09/23/2014 22:08     EKG Interpretation   Date/Time:  Saturday Sep 23 2014 19:58:45 EDT Ventricular Rate:  77 PR Interval:  181 QRS Duration: 123 QT Interval:  428 QTC Calculation: 484 R Axis:   -52 Text Interpretation:  Sinus rhythm Probable left atrial enlargement  Incomplete left bundle branch block Abnormal T, consider ischemia, lateral  leads Abnormal ekg since last tracing no significant change Confirmed by  MILLER  MD, BRIAN ([id number]) on 09/23/2014 8:05:26 PM      MDM   Final diagnoses:  Fall    Pt presented with severe right hip pain and deformity after a witnessed mechanical fall at her home.  She has long-term use of anticoagulants, however I'm reassured that the history of her fall and physical exam rule out trauma to her head and need for a head CT.   Films confirm a closed, comminuted right intertrochanteric fracture.  PreOp workup was ordered, ortho consulted by Dr. Sabra Heck, and pt was admitted and will be NPO after midnight for surgery tomorrow.        Delsa Grana, PA-C 09/24/14 0007  Noemi Chapel, MD 09/24/14 [phone]

## 2014-09-23 NOTE — ED Notes (Signed)
Bed: HQ19 Expected date:  Expected time:  Means of arrival:  Comments: EMS 81 yo from home/slipped and fell/right hip with rotation

## 2014-09-23 NOTE — H&P (Signed)
[facility] History and Physical  RONIT CRANFIELD DJM:[id number] DOB: [birth year] DOA: 09/23/2014  Referring physician: EDP PCP: [name], MD   Chief Complaint: R hip pain, fall   HPI: Shannon [name] is a 81 y.o. female who suffered a mechanical fall this evening while walking down porch.  Fell on her R hip.  Has R hip pain with movement and inability to bear weight on leg.  Patient was brought to ED.  Nothing makes pain better nor worse.  Did not hit head with fall.  Review of Systems: no recent SOB, chest pain, DOE, Systems reviewed.  As above, otherwise negative  Past Medical History  Diagnosis Date  . GERD (gastroesophageal reflux disease)   . Hypertension   . Hypothyroidism   . Cancer     left breast  . Hx of radiation therapy 2001    left breast   Past Surgical History  Procedure Laterality Date  . Breast surgery     Social History:  reports that she quit smoking about 46 years ago. She has never used smokeless tobacco. She reports that she does not drink alcohol or use illicit drugs.  Allergies  Allergen Reactions  . Demerol Nausea And Vomiting  . Keflet [Cephalexin] Nausea And Vomiting  . Sulfa Antibiotics Nausea And Vomiting  . Tequin Nausea Only  . Penicillins Rash    No family history on file.   Prior to Admission medications   Medication Sig Start Date End Date Taking? Authorizing Provider  atorvastatin (LIPITOR) 10 MG tablet Take 10 mg by mouth daily.   Yes Historical Provider, MD  chlordiazePOXIDE (LIBRIUM) 5 MG capsule Take 1 capsule by mouth 2 (two) times daily as needed for anxiety.  09/08/14  Yes Historical Provider, MD  Cholecalciferol (VITAMIN D) 2000 UNITS tablet Take 2,000 Units by mouth daily.   Yes Historical Provider, MD  [employer] STOOL SOFTENER 100 MG capsule Take 1 capsule by mouth 2 (two) times daily as needed for moderate constipation.  06/28/14  Yes Historical Provider, MD  diltiazem (CARDIZEM CD) 180 MG 24 hr capsule 180 mg daily.   11/10/12  Yes Historical Provider, MD  famotidine (PEPCID) 20 MG tablet 20 mg See admin instructions. Takes 1 on Sunday, Tuesday, Thursday and Saturday. 11/27/12  Yes Historical Provider, MD  furosemide (LASIX) 20 MG tablet Take 20 mg by mouth daily as needed (leg swelling).    Yes Historical Provider, MD  levothyroxine (SYNTHROID, LEVOTHROID) 125 MCG tablet Take 1 tablet (125 mcg total) by mouth daily before breakfast. 07/08/11 09/23/14 Yes Josetta Huddle, MD  MYRBETRIQ 50 MG TB24 Take 50 mg by mouth daily.  11/10/12  Yes Historical Provider, MD  pantoprazole (PROTONIX) 40 MG tablet Take 40 mg by mouth daily.   Yes Historical Provider, MD  propafenone (RYTHMOL) 150 MG tablet Take 150 mg by mouth 3 (three) times daily.   Yes Historical Provider, MD  trimethoprim (TRIMPEX) 100 MG tablet Take 100 mg by mouth at bedtime.   Yes Historical Provider, MD  vitamin B-12 (CYANOCOBALAMIN) 1000 MCG tablet Take 1,000 mcg by mouth daily.   Yes Historical Provider, MD  warfarin (COUMADIN) 5 MG tablet 2.5-5 mg daily. Alternates between 2.5mg  (half-tab) and 5mg  daily.  Sunday, Tuesday, Thursday- 2.5mg  Monday, Wednesday, Friday, Saturday- 5mg  10/23/12  Yes Historical Provider, MD  zolpidem (AMBIEN) 5 MG tablet Take 5 mg by mouth at bedtime as needed for sleep.    Yes Historical Provider, MD   Physical Exam: Filed Vitals:  09/23/14 2300  BP: 127/70  Pulse: 73  Temp:   Resp: 16    BP 127/70 mmHg  Pulse 73  Temp(Src) 97.9 F (36.6 C) (Oral)  Resp 16  Ht 5\' 3"  (1.6 m)  Wt 68.04 kg (150 lb)  BMI 26.58 kg/m2  SpO2 94%  General Appearance:    Alert, oriented, no distress, appears stated age  Head:    Normocephalic, atraumatic  Eyes:    PERRL, EOMI, sclera non-icteric        Nose:   Nares without drainage or epistaxis. Mucosa, turbinates normal  Throat:   Moist mucous membranes. Oropharynx without erythema or exudate.  Neck:   Supple. No carotid bruits.  No thyromegaly.  No lymphadenopathy.   Back:     No CVA  tenderness, no spinal tenderness  Lungs:     Clear to auscultation bilaterally, without wheezes, rhonchi or rales  Chest wall:    No tenderness to palpitation  Heart:    Regular rate and rhythm without murmurs, gallops, rubs  Abdomen:     Soft, non-tender, nondistended, normal bowel sounds, no organomegaly  Genitalia:    deferred  Rectal:    deferred  Extremities:   R hip deformed, shortened, externally rotated.  Pulses:   2+ and symmetric all extremities  Skin:   Skin color, texture, turgor normal, no rashes or lesions  Lymph nodes:   Cervical, supraclavicular, and axillary nodes normal  Neurologic:   CNII-XII intact. Normal strength, sensation and reflexes      throughout    Labs on Admission:  Basic Metabolic Panel:  Recent Labs Lab 09/23/14 2018  NA 138  K 4.3  CL 104  CO2 24  GLUCOSE 103*  BUN 17  CREATININE 0.67  CALCIUM 9.8   Liver Function Tests:  Recent Labs Lab 09/23/14 2018  AST 25  ALT 19  ALKPHOS 73  BILITOT 0.7  PROT 6.9  ALBUMIN 3.9   No results for input(s): LIPASE, AMYLASE in the last 168 hours. No results for input(s): AMMONIA in the last 168 hours. CBC:  Recent Labs Lab 09/23/14 2018  WBC 6.5  NEUTROABS 4.2  HGB 13.1  HCT 40.7  MCV 93.1  PLT 192   Cardiac Enzymes: No results for input(s): CKTOTAL, CKMB, CKMBINDEX, TROPONINI in the last 168 hours.  BNP (last 3 results) No results for input(s): PROBNP in the last 8760 hours. CBG: No results for input(s): GLUCAP in the last 168 hours.  Radiological Exams on Admission: Dg Chest Port 1 View  09/23/2014   CLINICAL DATA:  Status post fall. Concern for chest injury. Initial encounter.  EXAM: PORTABLE CHEST - 1 VIEW  COMPARISON:  Chest radiograph performed 07/04/2011  FINDINGS: The lungs are well-aerated. Pulmonary vascularity is at the upper limits of normal. Mild peribronchial thickening is noted. There is no evidence of focal opacification, pleural effusion or pneumothorax.  The  cardiomediastinal silhouette is borderline normal in size. No acute osseous abnormalities are seen.  IMPRESSION: Mild peribronchial thickening; lungs otherwise clear. No displaced rib fracture seen.   Electronically Signed   By: Garald Balding M.D.   On: 09/23/2014 22:52   Dg Hip Port Unilat With Pelvis 1v Right  09/23/2014   CLINICAL DATA:  Slip and fall down steps at home earlier this day. Now with right hip pain.  EXAM: RIGHT HIP (WITH PELVIS) 1 VIEW PORTABLE  COMPARISON:  None.  FINDINGS: Comminuted angulated displaced intertrochanteric right femur fracture. No definite extension to the femoral neck.  Femoral head remains seated in the acetabulum. Remainder of the bony pelvis is intact.  IMPRESSION: Comminuted angulated displaced intertrochanteric right femur fracture.   Electronically Signed   By: Jeb Levering M.D.   On: 09/23/2014 22:17   Dg Femur Port, Min 2 Views Right  09/23/2014   CLINICAL DATA:  Right hip pain, fell coming down steps.  EXAM: RIGHT FEMUR PORTABLE 1 VIEW  COMPARISON:  Right hip series.  FINDINGS: There is a comminuted right femoral intertrochanteric fracture with varus angulation. No additional femoral abnormality. No subluxation or dislocation. Mild degenerative changes in the hips bilaterally.  IMPRESSION: Comminuted, angulated right femoral intertrochanteric fracture.   Electronically Signed   By: Rolm Baptise M.D.   On: 09/23/2014 22:08    EKG: Independently reviewed.  Assessment/Plan Principal Problem:   Fracture, intertrochanteric, right femur Active Problems:   A-fib   Warfarin-induced coagulopathy   1. R femur fracture - 1. Hip fracture pathway 2. NPO after midnight 3. INR currently > 2, so will just order SCDs for the moment for DVT ppx 2. Coumadin coagulopathy - 1. Orthopaedic surgery aware 2. Giving Vit K 3. Goal INR is less than 2 for surgery to proceed 4. Repeat INR ordered for AM. 5. Holding coumadin 3. A.Fib - continue home meds for  control   Code Status: Full  Family Communication: Family at bedside Disposition Plan: Admit to inpatient   Time spent: 42 min  Tenley Winward M. [facility] Pager [phone]  If 7AM-7PM, please contact the day team taking care of the patient [email or web address] Password [email or web address] 09/23/2014, 11:34 PM

## 2014-09-24 ENCOUNTER — Encounter (HOSPITAL_COMMUNITY)
Admission: EM | Disposition: A | Discharge status: Skilled Nursing Facility | Payer: Self-pay | Source: Home / Self Care | Attending: Internal Medicine

## 2014-09-24 ENCOUNTER — Inpatient Hospital Stay (HOSPITAL_COMMUNITY): Payer: Medicare Other

## 2014-09-24 ENCOUNTER — Inpatient Hospital Stay (HOSPITAL_COMMUNITY): Payer: Medicare Other | Admitting: Anesthesiology

## 2014-09-24 ENCOUNTER — Encounter (HOSPITAL_COMMUNITY): Payer: Self-pay | Admitting: Certified Registered"

## 2014-09-24 DIAGNOSIS — S72141A Displaced intertrochanteric fracture of right femur, initial encounter for closed fracture: Principal | ICD-10-CM

## 2014-09-24 DIAGNOSIS — S72001A Fracture of unspecified part of neck of right femur, initial encounter for closed fracture: Secondary | ICD-10-CM | POA: Diagnosis present

## 2014-09-24 DIAGNOSIS — I10 Essential (primary) hypertension: Secondary | ICD-10-CM

## 2014-09-24 DIAGNOSIS — E039 Hypothyroidism, unspecified: Secondary | ICD-10-CM

## 2014-09-24 DIAGNOSIS — I4891 Unspecified atrial fibrillation: Secondary | ICD-10-CM

## 2014-09-24 HISTORY — PX: INTRAMEDULLARY (IM) NAIL INTERTROCHANTERIC: SHX5875

## 2014-09-24 LAB — ABO/RH: ABO/RH(D): A POS

## 2014-09-24 LAB — PROTIME-INR
INR: 1.82 — AB (ref 0.00–1.49)
Prothrombin Time: 21 seconds — ABNORMAL HIGH (ref 11.6–15.2)

## 2014-09-24 LAB — SURGICAL PCR SCREEN
MRSA, PCR: INVALID — AB
Staphylococcus aureus: INVALID — AB

## 2014-09-24 SURGERY — FIXATION, FRACTURE, INTERTROCHANTERIC, WITH INTRAMEDULLARY ROD
Anesthesia: General | Site: Hip | Laterality: Right

## 2014-09-24 MED ORDER — CIPROFLOXACIN IN D5W 400 MG/200ML IV SOLN
INTRAVENOUS | Status: AC
  Filled 2014-09-24: qty 200

## 2014-09-24 MED ORDER — WARFARIN - PHARMACIST DOSING INPATIENT: Freq: Every day | Status: DC

## 2014-09-24 MED ORDER — ONDANSETRON HCL 4 MG/2ML IJ SOLN: 4.0000 mg | Freq: Four times a day (QID) | INTRAMUSCULAR | Status: DC | PRN

## 2014-09-24 MED ORDER — LIDOCAINE HCL (CARDIAC) 20 MG/ML IV SOLN
INTRAVENOUS | Status: AC
  Filled 2014-09-24: qty 5

## 2014-09-24 MED ORDER — FAMOTIDINE 20 MG PO TABS
20.0000 mg | ORAL_TABLET | ORAL | Status: DC
  Administered 2014-09-26: 20 mg via ORAL
  Filled 2014-09-24 (×2): qty 1

## 2014-09-24 MED ORDER — FENTANYL CITRATE (PF) 100 MCG/2ML IJ SOLN
INTRAMUSCULAR | Status: AC
  Filled 2014-09-24: qty 2

## 2014-09-24 MED ORDER — DEXAMETHASONE SODIUM PHOSPHATE 10 MG/ML IJ SOLN
INTRAMUSCULAR | Status: AC
  Filled 2014-09-24: qty 1

## 2014-09-24 MED ORDER — SUCCINYLCHOLINE CHLORIDE 20 MG/ML IJ SOLN
INTRAMUSCULAR | Status: DC | PRN
  Administered 2014-09-24: 80 mg via INTRAVENOUS

## 2014-09-24 MED ORDER — CIPROFLOXACIN IN D5W 400 MG/200ML IV SOLN
INTRAVENOUS | Status: DC | PRN
  Administered 2014-09-24: 400 mg via INTRAVENOUS

## 2014-09-24 MED ORDER — PHENYLEPHRINE HCL 10 MG/ML IJ SOLN
INTRAMUSCULAR | Status: DC | PRN
  Administered 2014-09-24: 80 ug via INTRAVENOUS

## 2014-09-24 MED ORDER — PROPOFOL 10 MG/ML IV BOLUS
INTRAVENOUS | Status: DC | PRN
  Administered 2014-09-24: 120 mg via INTRAVENOUS

## 2014-09-24 MED ORDER — ONDANSETRON HCL 4 MG PO TABS: 4.0000 mg | ORAL_TABLET | Freq: Four times a day (QID) | ORAL | Status: DC | PRN

## 2014-09-24 MED ORDER — 0.9 % SODIUM CHLORIDE (POUR BTL) OPTIME
TOPICAL | Status: DC | PRN
  Administered 2014-09-24: 1000 mL

## 2014-09-24 MED ORDER — EPHEDRINE SULFATE 50 MG/ML IJ SOLN
INTRAMUSCULAR | Status: DC | PRN
  Administered 2014-09-24: 10 mg via INTRAVENOUS

## 2014-09-24 MED ORDER — METOCLOPRAMIDE HCL 5 MG/ML IJ SOLN
5.0000 mg | Freq: Three times a day (TID) | INTRAMUSCULAR | Status: DC | PRN
Start: 2014-09-24 — End: 2014-09-25

## 2014-09-24 MED ORDER — ONDANSETRON HCL 4 MG/2ML IJ SOLN
INTRAMUSCULAR | Status: AC
  Filled 2014-09-24: qty 2

## 2014-09-24 MED ORDER — ACETAMINOPHEN 325 MG PO TABS
650.0000 mg | ORAL_TABLET | Freq: Four times a day (QID) | ORAL | Status: DC | PRN
  Administered 2014-09-25 (×2): 650 mg via ORAL
  Filled 2014-09-24 (×2): qty 2

## 2014-09-24 MED ORDER — ZOLPIDEM TARTRATE 5 MG PO TABS
5.0000 mg | ORAL_TABLET | Freq: Every evening | ORAL | Status: DC | PRN
  Administered 2014-09-24: 5 mg via ORAL
  Filled 2014-09-24: qty 1

## 2014-09-24 MED ORDER — ONDANSETRON HCL 4 MG/2ML IJ SOLN
INTRAMUSCULAR | Status: DC | PRN
  Administered 2014-09-24: 4 mg via INTRAVENOUS

## 2014-09-24 MED ORDER — MENTHOL 3 MG MT LOZG: 1.0000 | LOZENGE | OROMUCOSAL | Status: DC | PRN

## 2014-09-24 MED ORDER — CIPROFLOXACIN HCL 500 MG PO TABS
500.0000 mg | ORAL_TABLET | Freq: Two times a day (BID) | ORAL | Status: AC
  Administered 2014-09-24 – 2014-09-25 (×2): 500 mg via ORAL
  Filled 2014-09-24 (×2): qty 1

## 2014-09-24 MED ORDER — PROPOFOL 10 MG/ML IV BOLUS
INTRAVENOUS | Status: AC
  Filled 2014-09-24: qty 20

## 2014-09-24 MED ORDER — POTASSIUM CHLORIDE IN NACL 20-0.9 MEQ/L-% IV SOLN
INTRAVENOUS | Status: DC
  Administered 2014-09-24 – 2014-09-25 (×2): via INTRAVENOUS
  Filled 2014-09-24 (×3): qty 1000

## 2014-09-24 MED ORDER — DOCUSATE SODIUM 100 MG PO CAPS
100.0000 mg | ORAL_CAPSULE | Freq: Two times a day (BID) | ORAL | Status: DC
Start: 2014-09-24 — End: 2014-09-26
  Administered 2014-09-24 – 2014-09-26 (×4): 100 mg via ORAL

## 2014-09-24 MED ORDER — FENTANYL CITRATE (PF) 100 MCG/2ML IJ SOLN
INTRAMUSCULAR | Status: DC | PRN
  Administered 2014-09-24: 50 ug via INTRAVENOUS
  Administered 2014-09-24: 100 ug via INTRAVENOUS
  Administered 2014-09-24: 50 ug via INTRAVENOUS

## 2014-09-24 MED ORDER — PROMETHAZINE HCL 25 MG/ML IJ SOLN: 6.2500 mg | INTRAMUSCULAR | Status: DC | PRN

## 2014-09-24 MED ORDER — PHENOL 1.4 % MT LIQD
1.0000 | OROMUCOSAL | Status: DC | PRN
  Filled 2014-09-24: qty 177

## 2014-09-24 MED ORDER — METOCLOPRAMIDE HCL 10 MG PO TABS: 5.0000 mg | ORAL_TABLET | Freq: Three times a day (TID) | ORAL | Status: DC | PRN

## 2014-09-24 MED ORDER — WARFARIN SODIUM 5 MG PO TABS
5.0000 mg | ORAL_TABLET | Freq: Once | ORAL | Status: AC
  Administered 2014-09-24: 5 mg via ORAL
  Filled 2014-09-24: qty 1

## 2014-09-24 MED ORDER — LACTATED RINGERS IV SOLN
INTRAVENOUS | Status: DC | PRN
  Administered 2014-09-24: 12:00:00 via INTRAVENOUS

## 2014-09-24 MED ORDER — DEXAMETHASONE SODIUM PHOSPHATE 10 MG/ML IJ SOLN
INTRAMUSCULAR | Status: DC | PRN
  Administered 2014-09-24: 10 mg via INTRAVENOUS

## 2014-09-24 MED ORDER — SODIUM CHLORIDE 0.9 % IV SOLN
Freq: Once | INTRAVENOUS | Status: AC
  Administered 2014-09-24: 10:00:00 via INTRAVENOUS

## 2014-09-24 MED ORDER — PHENYLEPHRINE 40 MCG/ML (10ML) SYRINGE FOR IV PUSH (FOR BLOOD PRESSURE SUPPORT)
PREFILLED_SYRINGE | INTRAVENOUS | Status: AC
  Filled 2014-09-24: qty 10

## 2014-09-24 MED ORDER — CLINDAMYCIN PHOSPHATE 900 MG/50ML IV SOLN
INTRAVENOUS | Status: AC
  Filled 2014-09-24: qty 50

## 2014-09-24 MED ORDER — SODIUM CHLORIDE 0.9 % IV SOLN
INTRAVENOUS | Status: AC
  Administered 2014-09-24: 01:00:00 via INTRAVENOUS

## 2014-09-24 MED ORDER — CLINDAMYCIN PHOSPHATE 600 MG/50ML IV SOLN
600.0000 mg | Freq: Three times a day (TID) | INTRAVENOUS | Status: AC
  Administered 2014-09-24 – 2014-09-25 (×2): 600 mg via INTRAVENOUS
  Filled 2014-09-24 (×2): qty 50

## 2014-09-24 MED ORDER — FENTANYL CITRATE (PF) 100 MCG/2ML IJ SOLN
25.0000 ug | INTRAMUSCULAR | Status: DC | PRN
  Administered 2014-09-24: 50 ug via INTRAVENOUS
  Administered 2014-09-24 (×2): 25 ug via INTRAVENOUS

## 2014-09-24 MED ORDER — ACETAMINOPHEN 650 MG RE SUPP: 650.0000 mg | Freq: Four times a day (QID) | RECTAL | Status: DC | PRN

## 2014-09-24 MED ORDER — CLINDAMYCIN PHOSPHATE 900 MG/50ML IV SOLN
INTRAVENOUS | Status: DC | PRN
  Administered 2014-09-24: 900 mg via INTRAVENOUS

## 2014-09-24 MED ORDER — LIDOCAINE HCL (PF) 2 % IJ SOLN
INTRAMUSCULAR | Status: DC | PRN
  Administered 2014-09-24: 40 mg via INTRADERMAL

## 2014-09-24 MED ORDER — LACTATED RINGERS IV SOLN: INTRAVENOUS | Status: DC

## 2014-09-24 SURGICAL SUPPLY — 42 items
BLADE CLIPPER SURG (BLADE) IMPLANT
BLADE SURG 15 STRL LF DISP TIS (BLADE) ×1 IMPLANT
BLADE SURG 15 STRL SS (BLADE) ×3
COVER SURGICAL LIGHT HANDLE (MISCELLANEOUS) ×3 IMPLANT
DRAPE ORTHO SPLIT 77X108 STRL (DRAPES) ×3
DRAPE PROXIMA HALF (DRAPES) IMPLANT
DRAPE STERI IOBAN 125X83 (DRAPES) ×3 IMPLANT
DRAPE SURG ORHT 6 SPLT 77X108 (DRAPES) ×1 IMPLANT
DRSG MEPILEX BORDER 4X4 (GAUZE/BANDAGES/DRESSINGS) ×6 IMPLANT
DRSG MEPILEX BORDER 4X8 (GAUZE/BANDAGES/DRESSINGS) ×3 IMPLANT
DURAPREP 26ML APPLICATOR (WOUND CARE) ×3 IMPLANT
ELECT REM PT RETURN 9FT ADLT (ELECTROSURGICAL) ×3
ELECTRODE REM PT RTRN 9FT ADLT (ELECTROSURGICAL) ×1 IMPLANT
FACESHIELD WRAPAROUND (MASK) ×3 IMPLANT
GAUZE XEROFORM 5X9 LF (GAUZE/BANDAGES/DRESSINGS) ×3 IMPLANT
GLOVE BIOGEL PI IND STRL 8 (GLOVE) ×1 IMPLANT
GLOVE BIOGEL PI INDICATOR 8 (GLOVE) ×2
GLOVE SURG ORTHO 8.0 STRL STRW (GLOVE) ×3 IMPLANT
GOWN STRL REUS W/ TWL XL LVL3 (GOWN DISPOSABLE) ×1 IMPLANT
GOWN STRL REUS W/TWL LRG LVL3 (GOWN DISPOSABLE) ×3 IMPLANT
GOWN STRL REUS W/TWL XL LVL3 (GOWN DISPOSABLE) ×3
GUIDE PIN 3.2 LONG (PIN) ×3 IMPLANT
GUIDE PIN 3.2MM (MISCELLANEOUS) ×3
GUIDE PIN ORTH 343X3.2XBRAD (MISCELLANEOUS) ×1 IMPLANT
HIP SCREW SET (Screw) ×3 IMPLANT
KIT BASIN OR (CUSTOM PROCEDURE TRAY) ×3 IMPLANT
KIT ROOM TURNOVER OR (KITS) ×3 IMPLANT
MANIFOLD NEPTUNE II (INSTRUMENTS) ×3 IMPLANT
NAIL IMHS 10X36 R (Nail) ×3 IMPLANT
NS IRRIG 1000ML POUR BTL (IV SOLUTION) ×3 IMPLANT
PACK GENERAL/GYN (CUSTOM PROCEDURE TRAY) ×3 IMPLANT
PAD ARMBOARD 7.5X6 YLW CONV (MISCELLANEOUS) ×6 IMPLANT
SCREW COMPRESSION (Screw) ×3 IMPLANT
SCREW LAG 95MM (Screw) ×3 IMPLANT
SCREW LAGSTD 95X21X12.7X9 (Screw) ×1 IMPLANT
SPONGE LAP 4X18 X RAY DECT (DISPOSABLE) ×3 IMPLANT
STAPLER VISISTAT 35W (STAPLE) ×3 IMPLANT
SUT VIC AB 0 CT1 36 (SUTURE) ×6 IMPLANT
SUT VIC AB 2-0 CTB1 (SUTURE) ×3 IMPLANT
TAPE STRIPS DRAPE STRL (GAUZE/BANDAGES/DRESSINGS) IMPLANT
TOWEL OR 17X24 6PK STRL BLUE (TOWEL DISPOSABLE) ×3 IMPLANT
TOWEL OR 17X26 10 PK STRL BLUE (TOWEL DISPOSABLE) ×3 IMPLANT

## 2014-09-24 NOTE — Progress Notes (Signed)
Freddie Breech, RN sent pt to OR with plasma hanging...plasma ended in OR at 12:30p per blood tag in chart but was not updated in [facility].  Ended in [facility] per policy by Cephas Darby, RN (current nurse) at time indicated on blood slip in chart.

## 2014-09-24 NOTE — Anesthesia Procedure Notes (Signed)
Procedure Name: Intubation Date/Time: 09/24/2014 12:25 PM Performed by: Lajuana Carry E Pre-anesthesia Checklist: Patient identified, Emergency Drugs available, Suction available and Patient being monitored Patient Re-evaluated:Patient Re-evaluated prior to inductionOxygen Delivery Method: Circle System Utilized Preoxygenation: Pre-oxygenation with 100% oxygen Intubation Type: IV induction Ventilation: Mask ventilation without difficulty Laryngoscope Size: Miller and 2 Grade View: Grade I Tube type: Oral Tube size: 7.0 mm Number of attempts: 1 Airway Equipment and Method: Stylet Placement Confirmation: ETT inserted through vocal cords under direct vision,  positive ETCO2 and breath sounds checked- equal and bilateral Secured at: 21 cm Tube secured with: Tape Dental Injury: Teeth and Oropharynx as per pre-operative assessment

## 2014-09-24 NOTE — Op Note (Signed)
NAMEMarland Kitchen  MONALISA, [name] NO.:  [id number]  MEDICAL RECORD NO.:  [id number]  LOCATION:  3                         FACILITY:  [facility]  PHYSICIAN:  Anderson Malta, M.D.    DATE OF BIRTH:  [birth year]  DATE OF PROCEDURE: DATE OF DISCHARGE:                              OPERATIVE REPORT   PREOPERATIVE DIAGNOSIS:  Right hip intertrochanteric fracture.  POSTOPERATIVE DIAGNOSIS:  Right hip intertrochanteric fracture.  PROCEDURE:  Right hip intertrochanteric fracture, open reduction and internal fixation using Smith and Nephew, IMHS 10 x 36 nail, 95 mm lag screw with additional compression screw for fracture compression.  SURGEON:  Anderson Malta, M.D.  ASSISTANT:  None.  ANESTHESIA:  General.  INDICATIONS:  Shannon [name] is a patient examined with tori with right hip fracture, presents for operative management after a fall yesterday and after explanation of risks and benefits.  PROCEDURE IN DETAIL:  The patient was brought to the operating room where general endotracheal anesthesia was induced.  Preop IV antibiotics were administered.  Time-out was called.  Right leg prescrubbed with alcohol and Betadine, allowed to air dry, prepped with DuraPrep solution and draped in sterile manner, placed under traction.  Left leg placed under traction as well.  Left leg was also placed in lithotomy position with peroneal nerve well padded.  At this time, under fluoroscopic guidance, the fracture was reduced with a combination of traction in internal rotation.  Following reduction, the operative field was covered with Ioban.  Prepped with DuraPrep solution and covered with Ioban. Incision was made about a handbreadth proximal to the greater trochanter.  A guide pin placed under fluoroscopic guidance in the AP and lateral planes to the tip of the trochanter.  Proximal reaming was then performed.  Nail was then placed through a separate incision.  A lag screw was placed through the nail into  the femoral head across the fracture site.  Correct location was confirmed in the AP and lateral planes under fluoroscopy.  Traction was released and a compression screw was placed to the lag screw in order to narrow the fracture gap distance.  The construct was stable to rotation.  Distal screws were not necessary.  At this time, thorough irrigation was performed on both incisions.  Incisions were closed using 0 Vicryl suture, 2-0 Vicryl suture, and skin staples.  The patient was transferred to recovery in stable condition.     Anderson Malta, M.D.     GSD/MEDQ  D:  09/24/2014  T:  09/24/2014  Job:  [id number]

## 2014-09-24 NOTE — Progress Notes (Signed)
Initial Nutrition Assessment  INTERVENTION: Once diet advanced: Ensure Enlive (each supplement provides 350kcal and 20 grams of protein)- Provide BID between meals  NUTRITION DIAGNOSIS:  Increased nutrient needs related to wound healing as evidenced by estimated needs.  GOAL:  Patient will meet greater than or equal to 90% of their needs  MONITOR:  PO intake, Supplement acceptance, Labs, Weight trends  REASON FOR ASSESSMENT:  Consult per hip fracture protocol  ASSESSMENT: 81 y.o. female who suffered a mechanical fall this evening while walking down porch. Fell on her R hip. Has R hip pain with movement and inability to bear weight on leg.  Pt with rt femur fx. Scheduled for surgery today.  Pt reports no recent weight loss or changes in appetite. Asking for food and drink. Confused that her surgery is today and her family is not in room. RN notified.  Pt drinks Ensure supplements at home BID. RD to continue after diet upgraded after surgery.   Height:  Ht Readings from Last 1 Encounters:  09/23/14 5\' 3"  (1.6 m)    Weight:  Wt Readings from Last 1 Encounters:  09/23/14 150 lb (68.04 kg)    Ideal Body Weight:  52.3 kg  Wt Readings from Last 10 Encounters:  09/23/14 150 lb (68.04 kg)  11/29/12 152 lb 9.6 oz (69.219 kg)  07/01/11 159 lb 9.8 oz (72.4 kg)    BMI:  Body mass index is 26.58 kg/(m^2).  Estimated Nutritional Needs:  Kcal:   1600-1800  Protein:   90-100 g  Fluid:   1.8 L/day  Skin:  Reviewed, no issues  Diet Order:  Diet NPO time specified  EDUCATION NEEDS:   Education needs addressed   Intake/Output Summary (Last 24 hours) at 09/24/14 1039 Last data filed at 09/24/14 0852  Gross per 24 hour  Intake      0 ml  Output    900 ml  Net   -900 ml    Last BM:  Prior to admission  Laurette Schimke [place], [place], [place]

## 2014-09-24 NOTE — Progress Notes (Signed)
PATIENT DETAILS Name: Shannon [name] Age: 81 y.o. Sex: female Date of Birth: 1924-06-06 Admit Date: 09/23/2014 Admitting Physician Etta Quill, DO DVV:OHYWV,PXTGGY NEVILL, MD  Subjective: Pain at the right hip.  Assessment/Plan: Principal Problem:   Fracture, intertrochanteric, right femur: Secondary to a mechanical fall. Evaluated by orthopedics, for right hip repair today.  Active Problems:  Paroxysmal atrial fibrillation: Rate controlled, continue A-fib on diltiazem. On Coumadin-reversed with vitamin K for right hip repair later today. Resume Coumadin at the discretion of orthopedics.   Hypertension: Controlled with diltiazem. Follow   Hypothyroidism: Continue with levothyroxine.   History of breast cancer: Status post left breast surgery-and chemotherapy/radiation.   GERD: Continue PPI  Disposition: Remain inpatient  Antimicrobial agents  See below  Anti-infectives    Start     Dose/Rate Route Frequency Ordered Stop   09/23/14 2345  [MAR Hold]  trimethoprim (TRIMPEX) tablet 100 mg     (MAR Hold since 09/24/14 1149)   100 mg Oral Daily at bedtime 09/23/14 2328        DVT Prophylaxis: Per orthopedics  Code Status: Full code  Family Communication None at bedside  Procedures: None  CONSULTS:  orthopedic surgery  Time spent 35 minutes-Greater than 50% of this time was spent in counseling, explanation of diagnosis, planning of further management, and coordination of care.  MEDICATIONS: Scheduled Meds: . [MAR Hold] atorvastatin  10 mg Oral q1800  . [MAR Hold] cholecalciferol  2,000 Units Oral Daily  . [MAR Hold] diltiazem  180 mg Oral Daily  . [MAR Hold] famotidine  20 mg Oral Once per day on Sun Tue Thu Sat  . fentaNYL      . [MAR Hold] levothyroxine  125 mcg Oral QAC breakfast  . [MAR Hold] mirabegron ER  50 mg Oral Daily  . [MAR Hold] pantoprazole  40 mg Oral Daily  . [MAR Hold] propafenone  150 mg Oral 3 times per day  . [MAR  Hold] trimethoprim  100 mg Oral QHS  . [MAR Hold] vitamin B-12  1,000 mcg Oral Daily   Continuous Infusions: . lactated ringers     PRN Meds:.[MAR Hold] chlordiazePOXIDE, [MAR Hold] docusate sodium, fentaNYL (SUBLIMAZE) injection, [MAR Hold] fentaNYL (SUBLIMAZE) injection, [MAR Hold] HYDROcodone-acetaminophen, [MAR Hold]  morphine injection, promethazine, [MAR Hold] zolpidem    PHYSICAL EXAM: Vital signs in last 24 hours: Filed Vitals:   09/24/14 1025 09/24/14 1100 09/24/14 1115 09/24/14 1404  BP: 135/69 130/66 120/82 165/83  Pulse:  98 97 90  Temp:  98.7 F (37.1 C) 100 F (37.8 C) 98 F (36.7 C)  TempSrc:  Oral Oral   Resp:  16 16 20   Height:      Weight:      SpO2:  95% 96% 100%    Weight change:  Filed Weights   09/23/14 1955  Weight: 68.04 kg (150 lb)   Body mass index is 26.58 kg/(m^2).   Gen Exam: Awake and alert with clear speech.  Neck: Supple, No JVD.   Chest: B/L Clear.   CVS: S1 S2 Regular, no murmurs.  Abdomen: soft, BS +, non tender, non distended.  Extremities: no edema, lower extremities warm to touch. Neurologic: Non Focal.   Skin: No Rash.   Wounds: N/A.   Intake/Output from previous day:  Intake/Output Summary (Last 24 hours) at 09/24/14 1433 Last data filed at 09/24/14 1403  Gross per 24 hour  Intake  650 ml  Output   1250 ml  Net   -600 ml     LAB RESULTS: CBC  Recent Labs Lab 09/23/14 2018  WBC 6.5  HGB 13.1  HCT 40.7  PLT 192  MCV 93.1  MCH 30.0  MCHC 32.2  RDW 14.4  LYMPHSABS 1.3  MONOABS 0.8  EOSABS 0.2  BASOSABS 0.0    Chemistries   Recent Labs Lab 09/23/14 2018  NA 138  K 4.3  CL 104  CO2 24  GLUCOSE 103*  BUN 17  CREATININE 0.67  CALCIUM 9.8    CBG: No results for input(s): GLUCAP in the last 168 hours.  GFR Estimated Creatinine Clearance: 45 mL/min (by C-G formula based on Cr of 0.67).  Coagulation profile  Recent Labs Lab 09/23/14 2018 09/24/14 0450  INR 2.23* 1.82*    Cardiac  Enzymes No results for input(s): CKMB, TROPONINI, MYOGLOBIN in the last 168 hours.  Invalid input(s): CK  Invalid input(s): POCBNP No results for input(s): DDIMER in the last 72 hours. No results for input(s): HGBA1C in the last 72 hours. No results for input(s): CHOL, HDL, LDLCALC, TRIG, CHOLHDL, LDLDIRECT in the last 72 hours. No results for input(s): TSH, T4TOTAL, T3FREE, THYROIDAB in the last 72 hours.  Invalid input(s): FREET3 No results for input(s): VITAMINB12, FOLATE, FERRITIN, TIBC, IRON, RETICCTPCT in the last 72 hours. No results for input(s): LIPASE, AMYLASE in the last 72 hours.  Urine Studies No results for input(s): UHGB, CRYS in the last 72 hours.  Invalid input(s): UACOL, UAPR, USPG, UPH, UTP, UGL, UKET, UBIL, UNIT, UROB, ULEU, UEPI, UWBC, URBC, UBAC, CAST, UCOM, BILUA  MICROBIOLOGY: Recent Results (from the past 240 hour(s))  Surgical pcr screen     Status: Abnormal   Collection Time: 09/24/14  6:50 AM  Result Value Ref Range Status   MRSA, PCR INVALID RESULTS, SPECIMEN SENT FOR CULTURE (A) NEGATIVE Final    Comment: RESULT CALLED TO, READ BACK BY AND VERIFIED WITH: J LOVE AT 1345 ON 05.22.2016 BY NBROOKS    Staphylococcus aureus INVALID RESULTS, SPECIMEN SENT FOR CULTURE (A) NEGATIVE Final    Comment:        The Xpert SA Assay (FDA approved for NASAL specimens in patients over 13 years of age), is one component of a comprehensive surveillance program.  Test performance has been validated by [facility] for patients greater than or equal to 51 year old. It is not intended to diagnose infection nor to guide or monitor treatment.     RADIOLOGY STUDIES/RESULTS: Dg Chest Port 1 View  09/23/2014   CLINICAL DATA:  Status post fall. Concern for chest injury. Initial encounter.  EXAM: PORTABLE CHEST - 1 VIEW  COMPARISON:  Chest radiograph performed 07/04/2011  FINDINGS: The lungs are well-aerated. Pulmonary vascularity is at the upper limits of normal. Mild  peribronchial thickening is noted. There is no evidence of focal opacification, pleural effusion or pneumothorax.  The cardiomediastinal silhouette is borderline normal in size. No acute osseous abnormalities are seen.  IMPRESSION: Mild peribronchial thickening; lungs otherwise clear. No displaced rib fracture seen.   Electronically Signed   By: Garald Balding M.D.   On: 09/23/2014 22:52   Dg C-arm 1-60 Min-no Report  09/24/2014   CLINICAL DATA: ORIF right hip   C-ARM 1-60 MINUTES  Fluoroscopy was utilized by the requesting physician.  No radiographic  interpretation.    Dg Hip Port Unilat With Pelvis 1v Right  09/23/2014   CLINICAL DATA:  Slip and  fall down steps at home earlier this day. Now with right hip pain.  EXAM: RIGHT HIP (WITH PELVIS) 1 VIEW PORTABLE  COMPARISON:  None.  FINDINGS: Comminuted angulated displaced intertrochanteric right femur fracture. No definite extension to the femoral neck. Femoral head remains seated in the acetabulum. Remainder of the bony pelvis is intact.  IMPRESSION: Comminuted angulated displaced intertrochanteric right femur fracture.   Electronically Signed   By: Jeb Levering M.D.   On: 09/23/2014 22:17   Dg Femur 1v Right  09/24/2014   CLINICAL DATA:  Internal fixation of right hip fracture.  EXAM: RIGHT FEMUR 1 VIEW  COMPARISON:  09/23/2014  FINDINGS: Multiple intraoperative spot images demonstrate internal fixation across the right femoral intertrochanteric fracture with anatomic alignment. No hardware complicating feature.  IMPRESSION: Internal fixation across right femoral intertrochanteric fracture. Anatomic alignment.   Electronically Signed   By: Rolm Baptise M.D.   On: 09/24/2014 13:48   Dg Femur Port, Min 2 Views Right  09/23/2014   CLINICAL DATA:  Right hip pain, fell coming down steps.  EXAM: RIGHT FEMUR PORTABLE 1 VIEW  COMPARISON:  Right hip series.  FINDINGS: There is a comminuted right femoral intertrochanteric fracture with varus angulation. No  additional femoral abnormality. No subluxation or dislocation. Mild degenerative changes in the hips bilaterally.  IMPRESSION: Comminuted, angulated right femoral intertrochanteric fracture.   Electronically Signed   By: Rolm Baptise M.D.   On: 09/23/2014 22:08    Oren Binet, MD  [facility] Pager:[phone]  If 7PM-7AM, please contact night-coverage [email or web address] Password [facility] 09/24/2014, 2:33 PM   LOS: 1 day

## 2014-09-24 NOTE — Transfer of Care (Signed)
Immediate Anesthesia Transfer of Care Note  Patient: Shannon [name]  Procedure(s) Performed: Procedure(s): INTRAMEDULLARY (IM) NAIL INTERTROCHANTRIC (Right)  Patient Location: PACU  Anesthesia Type:General  Level of Consciousness:  sedated, patient cooperative and responds to stimulation  Airway & Oxygen Therapy:Patient Spontanous Breathing and Patient connected to face mask oxgen  Post-op Assessment:  Report given to PACU RN and Post -op Vital signs reviewed and stable  Post vital signs:  Reviewed and stable  Last Vitals:  Filed Vitals:   09/24/14 1115  BP: 120/82  Pulse: 97  Temp: 37.8 C  Resp: 16    Complications: No apparent anesthesia complications

## 2014-09-24 NOTE — Progress Notes (Signed)
Pt sent to OR with plasma running.

## 2014-09-24 NOTE — Anesthesia Postprocedure Evaluation (Signed)
  Anesthesia Post-op Note  Patient: Shannon [name]  Procedure(s) Performed: Procedure(s): INTRAMEDULLARY (IM) NAIL INTERTROCHANTRIC (Right)  Patient Location: PACU  Anesthesia Type:General  Level of Consciousness: awake and alert   Airway and Oxygen Therapy: Patient Spontanous Breathing  Post-op Pain: mild  Post-op Assessment: Post-op Vital signs reviewed  Post-op Vital Signs: stable  Last Vitals:  Filed Vitals:   09/24/14 1714  BP: 121/60  Pulse: 79  Temp: 37.1 C  Resp: 16    Complications: No apparent anesthesia complications

## 2014-09-24 NOTE — Progress Notes (Signed)
Right hip IT fracture in ambulatory 81 yo female xrays reviewed inr 2.2 Plan consult am with likely fixation Sunday after inr has decreased

## 2014-09-24 NOTE — Anesthesia Preprocedure Evaluation (Addendum)
Anesthesia Evaluation  Patient identified by MRN, date of birth, ID bandGeneral Assessment Comment:HOH, mildly confused  Reviewed: Allergy & Precautions, NPO status , Patient's Chart, lab work & pertinent test results  History of Anesthesia Complications Negative for: history of anesthetic complications  Airway Mallampati: I  TM Distance: >3 FB Neck ROM: Full    Dental  (+) Teeth Intact   Pulmonary former smoker,  breath sounds clear to auscultation        Cardiovascular hypertension, Rhythm:Regular Rate:Normal  Hx of Afib   Neuro/Psych negative neurological ROS     GI/Hepatic GERD-  ,  Endo/Other  Hypothyroidism   Renal/GU      Musculoskeletal   Abdominal   Peds  Hematology On Warfarin   Anesthesia Other Findings   Reproductive/Obstetrics                           Anesthesia Physical Anesthesia Plan  ASA: III  Anesthesia Plan: General   Post-op Pain Management:    Induction: Intravenous  Airway Management Planned: Oral ETT  Additional Equipment:   Intra-op Plan:   Post-operative Plan: Extubation in OR  Informed Consent: I have reviewed the patients History and Physical, chart, labs and discussed the procedure including the risks, benefits and alternatives for the proposed anesthesia with the patient or authorized representative who has indicated his/her understanding and acceptance.   Dental advisory given  Plan Discussed with: CRNA and Surgeon  Anesthesia Plan Comments:         Anesthesia Quick Evaluation

## 2014-09-24 NOTE — Progress Notes (Signed)
ANTICOAGULATION CONSULT NOTE - Initial Consult  Pharmacy Consult for coumadin Indication: VTE prophylaxis; hx afib  Allergies  Allergen Reactions  . Demerol Nausea And Vomiting  . Keflet [Cephalexin] Nausea And Vomiting  . Sulfa Antibiotics Nausea And Vomiting  . Tequin Nausea Only  . Penicillins Rash    Patient Measurements: Height: 5\' 3"  (160 cm) Weight: 150 lb (68.04 kg) IBW/kg (Calculated) : 52.4  Vital Signs: Temp: 98 F (36.7 C) (05/22 1515) Temp Source: Oral (05/22 1115) BP: 129/55 mmHg (05/22 1515) Pulse Rate: 86 (05/22 1515)  Labs:  Recent Labs  09/23/14 2018 09/24/14 0450  HGB 13.1  --   HCT 40.7  --   PLT 192  --   LABPROT 24.5* 21.0*  INR 2.23* 1.82*  CREATININE 0.67  --     Estimated Creatinine Clearance: 45 mL/min (by C-G formula based on Cr of 0.67).   Medical History: Past Medical History  Diagnosis Date  . GERD (gastroesophageal reflux disease)   . Hypertension   . Hypothyroidism   . Cancer     left breast  . Hx of radiation therapy 2001    left breast    Medications:  See med rec  Assessment: Patient's an 81 y.o F on coumadin PTA for hx afib. She presented to the ED on 5/21 after falling at home and was found to have right IT hip fractrure. She was reversed with vit K 5mg  IV x1 this morning at 0100 and 1 unit of FFP in the OR at 1100 -- now S/p repair of right hip fracture on 5/22. To resume coumadin post-op. - INR was 1.82 at 0450 this morning  Home dose: 5mg  daily except 2.5 mg on TTSun  Goal of Therapy:  INR 2-3    Plan:  - coumadin 5mg  PO x1 today - daily INR  Abbie Jablon P 09/24/2014,3:24 PM

## 2014-09-24 NOTE — Brief Op Note (Signed)
09/23/2014 - 09/24/2014  1:57 PM  PATIENT:  Shannon [name]  81 y.o. female  PRE-OPERATIVE DIAGNOSIS:  right hip fracture  POST-OPERATIVE DIAGNOSIS:  right hip fracture  PROCEDURE:  Procedure(s): INTRAMEDULLARY (IM) NAIL INTERTROCHANTRIC  SURGEON:  Surgeon(s): Meredith Pel, MD  ASSISTANT: none  ANESTHESIA:   general  EBL: 150 ml    Total I/O In: 0  Out: 450 [Urine:250; Blood:200]  BLOOD ADMINISTERED: none  DRAINS: none   LOCAL MEDICATIONS USED:  none  SPECIMEN:  No Specimen  COUNTS:  YES  TOURNIQUET:  * No tourniquets in log *  DICTATION: .Other Dictation: Dictation Number [id number]  PLAN OF CARE: Admit to inpatient   PATIENT DISPOSITION:  PACU - hemodynamically stable

## 2014-09-24 NOTE — Consult Note (Signed)
Reason for Consult right hip pain Referring Physician: Dr Tonita Phoenix is an 81 y.o. female.  HPI: Shannon [name] is an 81 year old female with right hip pain. She was attending her husband 18st birthday party when she fell and injured her right hip. Patient is on Coumadin for atrial fibrillation. INR last night 2.1 INR this morning 1.8 she denies a loss of consciousness or any other orthopedic complaints. She is a Hydrographic surveyor.  Past Medical History  Diagnosis Date  . GERD (gastroesophageal reflux disease)   . Hypertension   . Hypothyroidism   . Cancer     left breast  . Hx of radiation therapy 2001    left breast    Past Surgical History  Procedure Laterality Date  . Breast surgery      No family history on file.  Social History:  reports that she quit smoking about 46 years ago. She has never used smokeless tobacco. She reports that she does not drink alcohol or use illicit drugs.  Allergies:  Allergies  Allergen Reactions  . Demerol Nausea And Vomiting  . Keflet [Cephalexin] Nausea And Vomiting  . Sulfa Antibiotics Nausea And Vomiting  . Tequin Nausea Only  . Penicillins Rash    Medications: I have reviewed the patient's current medications.  Results for orders placed or performed during the hospital encounter of 09/23/14 (from the past 48 hour(s))  CBC with Differential     Status: Abnormal   Collection Time: 09/23/14  8:18 PM  Result Value Ref Range   WBC 6.5 4.0 - 10.5 K/uL   RBC 4.37 3.87 - 5.11 MIL/uL   Hemoglobin 13.1 12.0 - 15.0 g/dL   HCT 40.7 36.0 - 46.0 %   MCV 93.1 78.0 - 100.0 fL   MCH 30.0 26.0 - 34.0 pg   MCHC 32.2 30.0 - 36.0 g/dL   RDW 14.4 11.5 - 15.5 %   Platelets 192 150 - 400 K/uL   Neutrophils Relative % 64 43 - 77 %   Neutro Abs 4.2 1.7 - 7.7 K/uL   Lymphocytes Relative 20 12 - 46 %   Lymphs Abs 1.3 0.7 - 4.0 K/uL   Monocytes Relative 13 (H) 3 - 12 %   Monocytes Absolute 0.8 0.1 - 1.0 K/uL   Eosinophils Relative 3 0 - 5 %   Eosinophils Absolute 0.2 0.0 - 0.7 K/uL   Basophils Relative 0 0 - 1 %   Basophils Absolute 0.0 0.0 - 0.1 K/uL  Comprehensive metabolic panel     Status: Abnormal   Collection Time: 09/23/14  8:18 PM  Result Value Ref Range   Sodium 138 135 - 145 mmol/L   Potassium 4.3 3.5 - 5.1 mmol/L   Chloride 104 101 - 111 mmol/L   CO2 24 22 - 32 mmol/L   Glucose, Bld 103 (H) 65 - 99 mg/dL   BUN 17 6 - 20 mg/dL   Creatinine, Ser 0.67 0.44 - 1.00 mg/dL   Calcium 9.8 8.9 - 10.3 mg/dL   Total Protein 6.9 6.5 - 8.1 g/dL   Albumin 3.9 3.5 - 5.0 g/dL   AST 25 15 - 41 U/L   ALT 19 14 - 54 U/L   Alkaline Phosphatase 73 38 - 126 U/L   Total Bilirubin 0.7 0.3 - 1.2 mg/dL   GFR calc non Af Amer >60 >60 mL/min   GFR calc Af Amer >60 >60 mL/min    Comment: (NOTE) The eGFR has been calculated using the CKD  EPI equation. This calculation has not been validated in all clinical situations. eGFR's persistently <60 mL/min signify possible Chronic Kidney Disease.    Anion gap 10 5 - 15  Protime-INR     Status: Abnormal   Collection Time: 09/23/14  8:18 PM  Result Value Ref Range   Prothrombin Time 24.5 (H) 11.6 - 15.2 seconds   INR 2.23 (H) 0.00 - 1.49  Type and screen     Status: None   Collection Time: 09/23/14  8:41 PM  Result Value Ref Range   ABO/RH(D) A POS    Antibody Screen NEG    Sample Expiration 09/26/2014   ABO/Rh     Status: None   Collection Time: 09/23/14  8:41 PM  Result Value Ref Range   ABO/RH(D) A POS   Urinalysis, Routine w reflex microscopic     Status: Abnormal   Collection Time: 09/23/14 10:01 PM  Result Value Ref Range   Color, Urine YELLOW YELLOW   APPearance CLOUDY (A) CLEAR   Specific Gravity, Urine 1.017 1.005 - 1.030   pH 6.0 5.0 - 8.0   Glucose, UA NEGATIVE NEGATIVE mg/dL   Hgb urine dipstick SMALL (A) NEGATIVE   Bilirubin Urine NEGATIVE NEGATIVE   Ketones, ur NEGATIVE NEGATIVE mg/dL   Protein, ur NEGATIVE NEGATIVE mg/dL   Urobilinogen, UA 0.2 0.0 - 1.0 mg/dL    Nitrite POSITIVE (A) NEGATIVE   Leukocytes, UA NEGATIVE NEGATIVE  Urine microscopic-add on     Status: Abnormal   Collection Time: 09/23/14 10:01 PM  Result Value Ref Range   Squamous Epithelial / LPF RARE RARE   WBC, UA 0-2 <3 WBC/hpf   RBC / HPF 3-6 <3 RBC/hpf   Bacteria, UA MANY (A) RARE  Protime-INR     Status: Abnormal   Collection Time: 09/24/14  4:50 AM  Result Value Ref Range   Prothrombin Time 21.0 (H) 11.6 - 15.2 seconds   INR 1.82 (H) 0.00 - 1.49  Prepare fresh frozen plasma     Status: None (Preliminary result)   Collection Time: 09/24/14  8:41 AM  Result Value Ref Range   Unit Number [id number]    Blood Component Type THAWED PLASMA    Unit division 00    Status of Unit ISSUED    Transfusion Status OK TO TRANSFUSE     Dg Chest Port 1 View  09/23/2014   CLINICAL DATA:  Status post fall. Concern for chest injury. Initial encounter.  EXAM: PORTABLE CHEST - 1 VIEW  COMPARISON:  Chest radiograph performed 07/04/2011  FINDINGS: The lungs are well-aerated. Pulmonary vascularity is at the upper limits of normal. Mild peribronchial thickening is noted. There is no evidence of focal opacification, pleural effusion or pneumothorax.  The cardiomediastinal silhouette is borderline normal in size. No acute osseous abnormalities are seen.  IMPRESSION: Mild peribronchial thickening; lungs otherwise clear. No displaced rib fracture seen.   Electronically Signed   By: Garald Balding M.D.   On: 09/23/2014 22:52   Dg Hip Port Unilat With Pelvis 1v Right  09/23/2014   CLINICAL DATA:  Slip and fall down steps at home earlier this day. Now with right hip pain.  EXAM: RIGHT HIP (WITH PELVIS) 1 VIEW PORTABLE  COMPARISON:  None.  FINDINGS: Comminuted angulated displaced intertrochanteric right femur fracture. No definite extension to the femoral neck. Femoral head remains seated in the acetabulum. Remainder of the bony pelvis is intact.  IMPRESSION: Comminuted angulated displaced intertrochanteric  right femur fracture.   Electronically Signed  By: Jeb Levering M.D.   On: 09/23/2014 22:17   Dg Femur Port, Min 2 Views Right  09/23/2014   CLINICAL DATA:  Right hip pain, fell coming down steps.  EXAM: RIGHT FEMUR PORTABLE 1 VIEW  COMPARISON:  Right hip series.  FINDINGS: There is a comminuted right femoral intertrochanteric fracture with varus angulation. No additional femoral abnormality. No subluxation or dislocation. Mild degenerative changes in the hips bilaterally.  IMPRESSION: Comminuted, angulated right femoral intertrochanteric fracture.   Electronically Signed   By: Rolm Baptise M.D.   On: 09/23/2014 22:08    Review of Systems  Constitutional: Negative.   HENT: Negative.   Eyes: Negative.   Respiratory: Negative.   Cardiovascular: Negative.   Gastrointestinal: Negative.   Genitourinary: Negative.   Musculoskeletal: Positive for joint pain.  Skin: Negative.   Neurological: Negative.   Endo/Heme/Allergies: Negative.   Psychiatric/Behavioral: Negative.    Blood pressure 120/82, pulse 97, temperature 100 F (37.8 C), temperature source Oral, resp. rate 16, height $RemoveBe'5\' 3"'[id number]$  (1.6 m), weight 68.04 kg (150 lb), SpO2 96 %. Physical Exam  Constitutional: She appears well-developed.  HENT:  Head: Normocephalic.  Eyes: Pupils are equal, round, and reactive to light.  Neck: Normal range of motion.  Cardiovascular: Normal rate.   Respiratory: Effort normal.  Neurological: She is alert.  Skin: Skin is warm.  Psychiatric: She has a normal mood and affect.   patient has good neck range of motion and good grip strength bilaterally. Radial pulses intact bilaterally has good elbow and shoulder range of motion without crepitus or bruising or ecchymosis. Left lower Ebony Hail he demonstrates palpable pedal pulses intact ankle dorsi/plantar flexion strength was trace knee effusion no groin pain internal extra rotation leg on the right-hand side she does have shortening and external rotation of  the right lower extremity with palpable pedal pulses intact ankle dorsi plantarflexion and no knee effusion  Assessment/Plan: Impression is right intertrochanteric hip fracture with possible subtrochanteric extension plan intramedullary hip screw with possible need for distal interlocks. Will assess stability of the construct of the time of surgery. Patient understands risk benefits of surgery including not limited to infection nerve vessel damage nonunion malunion need for more surgery. Her bone quality looks marginal. May have to have a period of protected weightbearing after surgical intervention. This may require nursing home stay. INR is currently 1.8. Plan to administer 1 unit FFP then proceed with surgical intervention and restart Coumadin tonight. All questions answered. Showed the x-rays to the patient did show her the type of fracture she has.  Shannon [name] 09/24/2014, 11:33 AM

## 2014-09-24 NOTE — Progress Notes (Signed)
PACU note----spoke with Dr. Marlou Sa; pt rec'd 1 unit FFP in OR; per Dr. Marlou Sa, pt does not need additional FFP

## 2014-09-24 NOTE — Care Management Note (Signed)
Case Management Note  Patient Details  Name: [name] MRN: [id number] Date of Birth: 1925/02/14  Subjective/Objective:                    Action/Plan:    Expected Discharge Date:  09/26/14               Expected Discharge Plan:  [place]  In-House Referral:     Discharge planning Services  CM Consult  Post Acute Care Choice:    Choice offered to:     DME Arranged:    DME Agency:     HH Arranged:    HH Agency:     Status of Service:  In process, will continue to follow  Medicare Important Message Given:    Date Medicare IM Given:    Medicare IM give by:    Date Additional Medicare IM Given:    Additional Medicare Important Message give by:     If discussed at [place] of Stay Meetings, dates discussed:    Additional Comments: CM spoke with patient at the bedside. Patient states she has a book at home with information about the previous home care agency she used. She is scheduled for surgery. Will await information from the family related to home health agency. Patient appears confused. Reports she has a RW and 3N1 at home.  Apolonio Schneiders, RN 09/24/2014, 9:51 AM

## 2014-09-25 ENCOUNTER — Encounter (HOSPITAL_COMMUNITY): Payer: Self-pay | Admitting: Orthopedic Surgery

## 2014-09-25 DIAGNOSIS — D62 Acute posthemorrhagic anemia: Secondary | ICD-10-CM

## 2014-09-25 LAB — BASIC METABOLIC PANEL
Anion gap: 7 (ref 5–15)
BUN: 17 mg/dL (ref 6–20)
CHLORIDE: 107 mmol/L (ref 101–111)
CO2: 24 mmol/L (ref 22–32)
Calcium: 9 mg/dL (ref 8.9–10.3)
Creatinine, Ser: 0.75 mg/dL (ref 0.44–1.00)
GFR calc Af Amer: >60 mL/min (ref >60)
GFR calc non Af Amer: >60 mL/min (ref >60)
GLUCOSE: 162 mg/dL — AB (ref 65–99)
Potassium: 4.4 mmol/L (ref 3.5–5.1)
Sodium: 138 mmol/L (ref 135–145)

## 2014-09-25 LAB — CBC
HCT: 26.7 % — ABNORMAL LOW (ref 36.0–46.0)
Hemoglobin: 8.8 g/dL — ABNORMAL LOW (ref 12.0–15.0)
MCH: 30.8 pg (ref 26.0–34.0)
MCHC: 33 g/dL (ref 30.0–36.0)
MCV: 93.4 fL (ref 78.0–100.0)
PLATELETS: 139 10*3/uL — AB (ref 150–400)
RBC: 2.86 MIL/uL — ABNORMAL LOW (ref 3.87–5.11)
RDW: 14.4 % (ref 11.5–15.5)
WBC: 10 10*3/uL (ref 4.0–10.5)

## 2014-09-25 LAB — PREPARE FRESH FROZEN PLASMA
UNIT DIVISION: 0
Unit division: 0

## 2014-09-25 LAB — PROTIME-INR
INR: 1.46 (ref 0.00–1.49)
Prothrombin Time: 17.9 seconds — ABNORMAL HIGH (ref 11.6–15.2)

## 2014-09-25 MED ORDER — POLYETHYLENE GLYCOL 3350 17 G PO PACK
17.0000 g | PACK | Freq: Every day | ORAL | Status: DC
  Administered 2014-09-25 – 2014-09-26 (×2): 17 g via ORAL

## 2014-09-25 MED ORDER — TRAMADOL HCL 50 MG PO TABS: 50.0000 mg | ORAL_TABLET | Freq: Four times a day (QID) | ORAL | Status: DC | PRN

## 2014-09-25 MED ORDER — ACETAMINOPHEN 500 MG PO TABS
1000.0000 mg | ORAL_TABLET | Freq: Three times a day (TID) | ORAL | Status: DC
  Administered 2014-09-25 – 2014-09-26 (×2): 1000 mg via ORAL
  Filled 2014-09-25 (×4): qty 2

## 2014-09-25 MED ORDER — WARFARIN SODIUM 5 MG PO TABS
5.0000 mg | ORAL_TABLET | Freq: Once | ORAL | Status: AC
  Administered 2014-09-25: 5 mg via ORAL
  Filled 2014-09-25: qty 1

## 2014-09-25 MED ORDER — TRAMADOL HCL 50 MG PO TABS
50.0000 mg | ORAL_TABLET | Freq: Four times a day (QID) | ORAL | Status: DC | PRN
Start: 2014-09-25 — End: 2014-09-26
  Administered 2014-09-25 – 2014-09-26 (×4): 50 mg via ORAL
  Filled 2014-09-25 (×4): qty 1

## 2014-09-25 NOTE — Care Management Note (Addendum)
Case Management Note  Patient Details  Name: Shannon [name] MRN: [id number] Date of Birth: [birth year]  Subjective/Objective:                 Fx of femur admitted after 11 pm on [id number] and or am of [id number]   Action/Plan: snf   Expected Discharge Date:  09/26/14               Expected Discharge Plan:  Skilled Nursing Facility  In-House Referral:  Clinical Social Work  Discharge planning Services  CM Consult  Post Acute Care Choice:    Choice offered to:     DME Arranged:    DME Agency:     HH Arranged:    [place] Agency:     Status of Service:  In process, will continue to follow  Medicare Important Message Given:    Date Medicare IM Given:    Medicare IM give by:    Date Additional Medicare IM Given:    Additional Medicare Important Message give by:     If discussed at [place] of Stay Meetings, dates discussed:    Additional Comments:  Leeroy Cha, RN 09/25/2014, 2:25 PM

## 2014-09-25 NOTE — Clinical Social Work Note (Signed)
Clinical Social Work Assessment  Patient Details  Name: Shannon [name] MRN: [id number] Date of Birth: January 31, 1925  Date of referral:  09/25/14               Reason for consult:  Discharge Planning, Facility Placement                Permission sought to share information with:    Permission granted to share information::     Name::        Agency::     Relationship::     Contact Information:     Housing/Transportation Living arrangements for the past 2 months:  Single Family Home Source of Information:  Patient Patient Interpreter Needed:  None Criminal Activity/Legal Involvement Pertinent to Current Situation/Hospitalization:    Significant Relationships:  Other Family Members, Spouse Lives with:  Spouse Do you feel safe going back to the place where you live?   (SNF placement needed.) Need for family participation in patient care:  Yes (Comment)  Care giving concerns:  Spouse is unable to manage pt's care at home following hospital d/c.   Social Worker assessment / plan:  Pt admitted 09/23/14 with a right femur fx. Surgery was required. PT has recommended ST rehab . CSW met with pt / family to assist with d/c planning. Pt / family are in agreement with plan for rehab. SNF search initiated and pt / family has chosen [facility] for rehab.  Employment status:  Retired Forensic scientist:    PT Recommendations:  [place] / Referral to community resources:  [place]  Patient/Family's Response to care: Pt / family feel rehab is needed.  Patient/Family's Understanding of and Emotional Response to Diagnosis, Current Treatment, and Prognosis:  Pt / family are pleased that [facility] has a rehab opening.   Emotional Assessment Appearance:  Appears stated age Attitude/Demeanor/Rapport:   (cooperative) Affect (typically observed):  Accepting, Calm, Pleasant Orientation:  Oriented to Self, Oriented to Place, Oriented to  Time Alcohol /  Substance use:  Not Applicable Psych involvement (Current and /or in the community):  No (Comment)  Discharge Needs  Concerns to be addressed:  Discharge Planning Concerns Readmission within the last 30 days:  No Current discharge risk:  None Barriers to Discharge:  No Barriers Identified   Shannon [name], Shannon An, LCSW 09/25/2014, 3:08 PM

## 2014-09-25 NOTE — Progress Notes (Signed)
ANTICOAGULATION CONSULT NOTE - F/U  Pharmacy Consult for coumadin Indication: VTE prophylaxis; hx afib  Allergies  Allergen Reactions  . Demerol Nausea And Vomiting  . Keflet [Cephalexin] Nausea And Vomiting  . Sulfa Antibiotics Nausea And Vomiting  . Tequin Nausea Only  . Penicillins Rash    Patient Measurements: Height: 5\' 3"  (160 cm) Weight: 150 lb (68.04 kg) IBW/kg (Calculated) : 52.4  Vital Signs: Temp: 97.7 F (36.5 C) (05/23 0957) Temp Source: Oral (05/23 0957) BP: 133/60 mmHg (05/23 0957) Pulse Rate: 81 (05/23 0957)  Labs:  Recent Labs  09/23/14 2018 09/24/14 0450 09/25/14 0520  HGB 13.1  --  8.8*  HCT 40.7  --  26.7*  PLT 192  --  139*  LABPROT 24.5* 21.0* 17.9*  INR 2.23* 1.82* 1.46  CREATININE 0.67  --  0.75    Estimated Creatinine Clearance: 45 mL/min (by C-G formula based on Cr of 0.75).   Medical History: Past Medical History  Diagnosis Date  . GERD (gastroesophageal reflux disease)   . Hypertension   . Hypothyroidism   . Cancer     left breast  . Hx of radiation therapy 2001    left breast   See MAR for inpatient warfarin doses administered  Assessment: 81 y.o F on coumadin PTA for hx afib. She presented to the ED on 5/21 after falling at home and was found to have right IT hip fractrure. She was reversed with vit K 5mg  IV x1 this morning at 0100 and 1 unit of FFP in the OR at 1100 -- now S/p repair of right hip fracture on 5/22. To resume coumadin post-op. - INR was 1.82 at 0450 this morning  Home dose: 5mg  daily except 2.5 mg on TTSun  Today, 09/25/2014: No INR response yet, as expected, after resuming warfarin last night. Hgb falling, c/w ABLA from surgery No other overt bleeding documented. Drug interactions:  Ciprofloxacin - can increase INR response to warfarin  Propafenone - can increase INR response to warfarin, but pt on this medication chronically so warfarin dosage was already adjusted for it   Goal of Therapy:  INR  2-3    Plan:  1. Warfarin 5 mg PO x 1 today at 1800 2. Daily PT/INR.  Clayburn Pert, PharmD, BCPS Pager: [phone] 09/25/2014  12:10 PM

## 2014-09-25 NOTE — Progress Notes (Signed)
Subjective: Pt stable - pain ok    Objective: Vital signs in last 24 hours: Temp:  [97.7 F (36.5 C)-99.9 F (37.7 C)] 97.7 F (36.5 C) (05/23 1401) Pulse Rate:  [81-94] 91 (05/23 1401) Resp:  [16-20] 20 (05/23 1401) BP: (108-133)/(58-81) 109/81 mmHg (05/23 1401) SpO2:  [94 %-96 %] 94 % (05/23 0957)  Intake/Output from previous day: 05/22 0701 - 05/23 0700 In: 1780 [P.O.:180; I.V.:1600] Out: 1550 [Urine:1350; Blood:200] Intake/Output this shift:    Exam:  Dorsiflexion/Plantar flexion intact  Labs:  Recent Labs  09/23/14 2018 09/25/14 0520  HGB 13.1 8.8*    Recent Labs  09/23/14 2018 09/25/14 0520  WBC 6.5 10.0  RBC 4.37 2.86*  HCT 40.7 26.7*  PLT 192 139*    Recent Labs  09/23/14 2018 09/25/14 0520  NA 138 138  K 4.3 4.4  CL 104 107  CO2 24 24  BUN 17 17  CREATININE 0.67 0.75  GLUCOSE 103* 162*  CALCIUM 9.8 9.0    Recent Labs  09/24/14 0450 09/25/14 0520  INR 1.82* 1.46    Assessment/Plan: Plan dc to snf am - hgb low but not unexpected with this fx on coumadin - tdwb only   DEAN,GREGORY SCOTT 09/25/2014, 7:02 PM

## 2014-09-25 NOTE — Progress Notes (Signed)
PATIENT DETAILS Name: Shannon [name] Age: 81 y.o. Sex: female Date of Birth: Jul 22, 1924 Admit Date: 09/23/2014 Admitting Physician Etta Quill, DO MVH:QIONG,EXBMWU NEVILL, MD  Subjective: Pain at the operative site.  Assessment/Plan: Principal Problem:   Fracture, intertrochanteric, right femur: Secondary to a mechanical fall. Evaluated by orthopedics, underwent open reduction and internal fixation on 5/22. On Coumadin for DVT prophylaxis. Will need SNF on discharge.  Active Problems:  Anemia: Secondary to perioperative blood loss. No indication for transfusion. Monitor.   Paroxysmal atrial fibrillation: Rate controlled, continue A-fib on diltiazem/rythmol. On Coumadin-reversed with vitamin K on admission for right hip repair-Coumadin has been resumed postoperatively, INR subtherapeutic at 1.46. Pharmacy managing.    Hypertension: Controlled with diltiazem. Follow   Hypothyroidism: Continue with levothyroxine.   Dyslipidemia: continue statin    History of breast cancer: Status post left breast surgery-and chemotherapy/radiation.   GERD: Continue PPI  Disposition: Remain inpatient-?SNF tomorrow  Antimicrobial agents  See below  Anti-infectives    Start     Dose/Rate Route Frequency Ordered Stop   09/24/14 2000  ciprofloxacin (CIPRO) tablet 500 mg     500 mg Oral 2 times daily 09/24/14 1519 09/25/14 0759   09/24/14 1800  clindamycin (CLEOCIN) IVPB 600 mg     600 mg 100 mL/hr over 30 Minutes Intravenous 3 times per day 09/24/14 1519 09/25/14 0618   09/23/14 2345  trimethoprim (TRIMPEX) tablet 100 mg  Status:  Discontinued     100 mg Oral Daily at bedtime 09/23/14 2328 09/24/14 1519      DVT Prophylaxis: Per orthopedics  Code Status: Full code  Family Communication None at bedside  Procedures: None  CONSULTS:  orthopedic surgery  Time spent 30 minutes-Greater than 50% of this time was spent in counseling, explanation of diagnosis,  planning of further management, and coordination of care.  MEDICATIONS: Scheduled Meds: . atorvastatin  10 mg Oral q1800  . cholecalciferol  2,000 Units Oral Daily  . diltiazem  180 mg Oral Daily  . docusate sodium  100 mg Oral BID  . famotidine  20 mg Oral Once per day on Sun Tue Thu Sat  . levothyroxine  125 mcg Oral QAC breakfast  . mirabegron ER  50 mg Oral Daily  . pantoprazole  40 mg Oral Daily  . propafenone  150 mg Oral 3 times per day  . vitamin B-12  1,000 mcg Oral Daily  . warfarin  5 mg Oral ONCE-1800  . Warfarin - Pharmacist Dosing Inpatient   Does not apply q1800   Continuous Infusions:   PRN Meds:.acetaminophen **OR** acetaminophen, chlordiazePOXIDE, docusate sodium, fentaNYL (SUBLIMAZE) injection, HYDROcodone-acetaminophen, menthol-cetylpyridinium **OR** phenol, metoCLOPramide **OR** metoCLOPramide (REGLAN) injection, morphine injection, ondansetron **OR** ondansetron (ZOFRAN) IV, zolpidem    PHYSICAL EXAM: Vital signs in last 24 hours: Filed Vitals:   09/25/14 0559 09/25/14 0957 09/25/14 1355 09/25/14 1401  BP: 108/60 133/60 120/58 109/81  Pulse: 94 81 87 91  Temp: 98.6 F (37 C) 97.7 F (36.5 C)  97.7 F (36.5 C)  TempSrc: Oral Oral  Oral  Resp: 16 20  20   Height:      Weight:      SpO2: 94% 94%      Weight change:  Filed Weights   09/23/14 1955  Weight: 68.04 kg (150 lb)   Body mass index is 26.58 kg/(m^2).   Gen Exam: Awake and alert with clear speech.  Neck: Supple,  No JVD.   Chest: B/L Clear.   CVS: S1 S2 Regular, no murmurs.  Abdomen: soft, BS +, non tender, non distended.  Extremities: no edema, lower extremities warm to touch. Neurologic: Non Focal.   Skin: No Rash.   Wounds: N/A.   Intake/Output from previous day:  Intake/Output Summary (Last 24 hours) at 09/25/14 1447 Last data filed at 09/25/14 1100  Gross per 24 hour  Intake   1370 ml  Output   1490 ml  Net   -120 ml     LAB RESULTS: CBC  Recent Labs Lab  09/23/14 2018 09/25/14 0520  WBC 6.5 10.0  HGB 13.1 8.8*  HCT 40.7 26.7*  PLT 192 139*  MCV 93.1 93.4  MCH 30.0 30.8  MCHC 32.2 33.0  RDW 14.4 14.4  LYMPHSABS 1.3  --   MONOABS 0.8  --   EOSABS 0.2  --   BASOSABS 0.0  --     Chemistries   Recent Labs Lab 09/23/14 2018 09/25/14 0520  NA 138 138  K 4.3 4.4  CL 104 107  CO2 24 24  GLUCOSE 103* 162*  BUN 17 17  CREATININE 0.67 0.75  CALCIUM 9.8 9.0    CBG: No results for input(s): GLUCAP in the last 168 hours.  GFR Estimated Creatinine Clearance: 45 mL/min (by C-G formula based on Cr of 0.75).  Coagulation profile  Recent Labs Lab 09/23/14 2018 09/24/14 0450 09/25/14 0520  INR 2.23* 1.82* 1.46    Cardiac Enzymes No results for input(s): CKMB, TROPONINI, MYOGLOBIN in the last 168 hours.  Invalid input(s): CK  Invalid input(s): POCBNP No results for input(s): DDIMER in the last 72 hours. No results for input(s): HGBA1C in the last 72 hours. No results for input(s): CHOL, HDL, LDLCALC, TRIG, CHOLHDL, LDLDIRECT in the last 72 hours. No results for input(s): TSH, T4TOTAL, T3FREE, THYROIDAB in the last 72 hours.  Invalid input(s): FREET3 No results for input(s): VITAMINB12, FOLATE, FERRITIN, TIBC, IRON, RETICCTPCT in the last 72 hours. No results for input(s): LIPASE, AMYLASE in the last 72 hours.  Urine Studies No results for input(s): UHGB, CRYS in the last 72 hours.  Invalid input(s): UACOL, UAPR, USPG, UPH, UTP, UGL, UKET, UBIL, UNIT, UROB, ULEU, UEPI, UWBC, URBC, UBAC, CAST, UCOM, BILUA  MICROBIOLOGY: Recent Results (from the past 240 hour(s))  Surgical pcr screen     Status: Abnormal   Collection Time: 09/24/14  6:50 AM  Result Value Ref Range Status   MRSA, PCR INVALID RESULTS, SPECIMEN SENT FOR CULTURE (A) NEGATIVE Final    Comment: RESULT CALLED TO, READ BACK BY AND VERIFIED WITH: J LOVE AT 1345 ON 05.22.2016 BY NBROOKS    Staphylococcus aureus INVALID RESULTS, SPECIMEN SENT FOR CULTURE (A)  NEGATIVE Final    Comment:        The Xpert SA Assay (FDA approved for NASAL specimens in patients over 67 years of age), is one component of a comprehensive surveillance program.  Test performance has been validated by [facility] for patients greater than or equal to 110 year old. It is not intended to diagnose infection nor to guide or monitor treatment.   MRSA culture     Status: None (Preliminary result)   Collection Time: 09/24/14 12:39 PM  Result Value Ref Range Status   Specimen Description NOSE  Final   Special Requests NONE  Final   Culture   Final    NO GROWTH 1 DAY Performed at [facility]  Report Status PENDING  Incomplete    RADIOLOGY STUDIES/RESULTS: Dg Chest Port 1 View  09/23/2014   CLINICAL DATA:  Status post fall. Concern for chest injury. Initial encounter.  EXAM: PORTABLE CHEST - 1 VIEW  COMPARISON:  Chest radiograph performed 07/04/2011  FINDINGS: The lungs are well-aerated. Pulmonary vascularity is at the upper limits of normal. Mild peribronchial thickening is noted. There is no evidence of focal opacification, pleural effusion or pneumothorax.  The cardiomediastinal silhouette is borderline normal in size. No acute osseous abnormalities are seen.  IMPRESSION: Mild peribronchial thickening; lungs otherwise clear. No displaced rib fracture seen.   Electronically Signed   By: Garald Balding M.D.   On: 09/23/2014 22:52   Dg C-arm 1-60 Min-no Report  09/24/2014   CLINICAL DATA: ORIF right hip   C-ARM 1-60 MINUTES  Fluoroscopy was utilized by the requesting physician.  No radiographic  interpretation.    Dg Hip Port Unilat With Pelvis 1v Right  09/23/2014   CLINICAL DATA:  Slip and fall down steps at home earlier this day. Now with right hip pain.  EXAM: RIGHT HIP (WITH PELVIS) 1 VIEW PORTABLE  COMPARISON:  None.  FINDINGS: Comminuted angulated displaced intertrochanteric right femur fracture. No definite extension to the femoral neck. Femoral head  remains seated in the acetabulum. Remainder of the bony pelvis is intact.  IMPRESSION: Comminuted angulated displaced intertrochanteric right femur fracture.   Electronically Signed   By: Jeb Levering M.D.   On: 09/23/2014 22:17   Dg Hip Unilat With Pelvis 2-3 Views Right  09/24/2014   CLINICAL DATA:  Fracture fixation  EXAM: DG C-ARM 1-60 MIN - NRPT [facility]; RIGHT HIP (WITH PELVIS) 2-3 VIEWS  COMPARISON:  09/23/2014  FINDINGS: Intertrochanteric fracture has been fixed with a compression screw and a long intra medullary rod extending to the distal femur. Fracture alignment and angulation are satisfactory. Hardware positioning is satisfactory.  IMPRESSION: Satisfactory fixation of right intertrochanteric fracture.   Electronically Signed   By: Franchot Gallo M.D.   On: 09/24/2014 14:58   Dg Femur 1v Right  09/24/2014   CLINICAL DATA:  Internal fixation of right hip fracture.  EXAM: RIGHT FEMUR 1 VIEW  COMPARISON:  09/23/2014  FINDINGS: Multiple intraoperative spot images demonstrate internal fixation across the right femoral intertrochanteric fracture with anatomic alignment. No hardware complicating feature.  IMPRESSION: Internal fixation across right femoral intertrochanteric fracture. Anatomic alignment.   Electronically Signed   By: Rolm Baptise M.D.   On: 09/24/2014 13:48   Dg Femur Port, Min 2 Views Right  09/23/2014   CLINICAL DATA:  Right hip pain, fell coming down steps.  EXAM: RIGHT FEMUR PORTABLE 1 VIEW  COMPARISON:  Right hip series.  FINDINGS: There is a comminuted right femoral intertrochanteric fracture with varus angulation. No additional femoral abnormality. No subluxation or dislocation. Mild degenerative changes in the hips bilaterally.  IMPRESSION: Comminuted, angulated right femoral intertrochanteric fracture.   Electronically Signed   By: Rolm Baptise M.D.   On: 09/23/2014 22:08    Oren Binet, MD  [facility] Pager:[phone]  If 7PM-7AM, please contact  night-coverage [email or web address] Password [email or web address] 09/25/2014, 2:47 PM   LOS: 2 days

## 2014-09-25 NOTE — Evaluation (Signed)
Physical Therapy Evaluation Patient Details Name: Shannon [name] MRN: [id number] DOB: [birth year] Today's Date: 09/25/2014   History of Present Illness  Pt is an 81 year old female s/p fall with R hip fracture and underwent IM nail with hx of breast cancer and HTN  Clinical Impression  Pt admitted with above diagnosis. Pt currently with functional limitations due to the deficits listed below (see PT Problem List).  Pt will benefit from skilled PT to increase their independence and safety with mobility to allow discharge to the venue listed below.   Pt with decreased cognition today, uncertain of baseline however increased difficulty following commands and maintaining NWB status.  Pt requiring increased assist for mobility.  Recommend SNF at this time.     Follow Up Recommendations SNF;Supervision/Assistance - 24 hour    Equipment Recommendations  None recommended by PT    Recommendations for Other Services       Precautions / Restrictions Precautions Precautions: Fall Restrictions Weight Bearing Restrictions: Yes RLE Weight Bearing: Non weight bearing      Mobility  Bed Mobility Overal bed mobility: Needs Assistance Bed Mobility: Sit to Supine       Sit to supine: Max assist;+2 for physical assistance   General bed mobility comments: assist for LEs onto bed and to support shoulders/trunk down to bed.   Transfers Overall transfer level: Needs assistance Equipment used: Rolling walker (2 wheeled) Transfers: Sit to/from Stand;Lateral/Scoot Transfers Sit to Stand: Max assist;+2 physical assistance        Lateral/Scoot Transfers: Mod assist;+2 physical assistance General transfer comment: attempted standing X 2 but pt unable to maintain NWB R LE and with increased difficulty following multimodal cues so performed Lateral scoot transfer from chair to bed with increased time, +2 mod assist.   Ambulation/Gait                Stairs            Wheelchair  Mobility    Modified Rankin (Stroke Patients Only)       Balance                                             Pertinent Vitals/Pain Pain Assessment: 0-10 Pain Score: 4  Pain Location: R leg Pain Descriptors / Indicators: Aching;Sore Pain Intervention(s): Repositioned;Monitored during session    Home Living Family/patient expects to be discharged to:: Private residence Living Arrangements: Spouse/significant other   Type of Home: House Home Access: Stairs to enter Entrance Stairs-Rails: Psychiatric nurse of Steps: 3 Home Layout: Able to live on main level with bedroom/bathroom;Laundry or work area in [place]: Civil engineer, contracting - built in;Walker - 2 wheels;Cane - single point      Prior Function Level of Independence: Independent with assistive device(s)               Hand Dominance        Extremity/Trunk Assessment   Upper Extremity Assessment: Overall WFL for tasks assessed           Lower Extremity Assessment: Generalized weakness;RLE deficits/detail RLE Deficits / Details: however observed some movement against gravity       Communication   Communication: No difficulties  Cognition Arousal/Alertness: Awake/alert Behavior During Therapy: WFL for tasks assessed/performed Overall Cognitive Status: No family/caregiver present to determine baseline cognitive functioning (pt slow with processing commands and difficulty sequencing  steps for tasks) Area of Impairment: Safety/judgement;Following commands     Memory: Decreased recall of precautions Following Commands: Follows one step commands inconsistently Safety/Judgement: Decreased awareness of safety;Decreased awareness of deficits          General Comments      Exercises        Assessment/Plan    PT Assessment Patient needs continued PT services  PT Diagnosis Difficulty walking;Acute pain   PT Problem List Decreased strength;Decreased  mobility;Decreased balance;Decreased knowledge of use of DME;Decreased safety awareness;Decreased cognition;Decreased activity tolerance;Pain  PT Treatment Interventions DME instruction;Gait training;Functional mobility training;Patient/family education;Therapeutic activities;Therapeutic exercise;Wheelchair mobility training;Balance training   PT Goals (Current goals can be found in the Care Plan section) Acute Rehab PT Goals Patient Stated Goal: ambulate again PT Goal Formulation: With patient Time For Goal Achievement: 10/02/14 Potential to Achieve Goals: Good    Frequency Min 3X/week   Barriers to discharge        Co-evaluation PT/OT/SLP Co-Evaluation/Treatment: Yes Reason for Co-Treatment: For patient/therapist safety PT goals addressed during session: Mobility/safety with mobility;Balance OT goals addressed during session: ADL's and self-care       End of Session Equipment Utilized During Treatment: Gait belt Activity Tolerance: Patient limited by pain;Patient limited by fatigue Patient left: in bed;with call bell/phone within reach;with bed alarm set;with family/visitor present Nurse Communication: Patient requests pain meds         Time: 1138-1200 PT Time Calculation (min) (ACUTE ONLY): 22 min   Charges:   PT Evaluation $Initial PT Evaluation Tier I: 1 Procedure     PT G Codes:        Ilisha Blust,KATHrine E 09/25/2014, 1:41 PM Carmelia Bake, PT, DPT 09/25/2014 Pager: [phone]

## 2014-09-25 NOTE — Progress Notes (Signed)
Date:  Sep 25, 2014 U.R. performed for needs and level of care. Will continue to follow for Case Management needs.  Rhonda Davis, RN, BSN, CCM   [phone] 

## 2014-09-25 NOTE — Discharge Instructions (Signed)
Touch down weight bearing only

## 2014-09-25 NOTE — Clinical Social Work Placement (Signed)
   CLINICAL SOCIAL WORK PLACEMENT  NOTE  Date:  09/25/2014  Patient Details  Name: Shannon [name] MRN: [id number] Date of Birth: August 31, 1924  Clinical Social Work is seeking post-discharge placement for this patient at the [place] level of care (*CSW will initial, date and re-position this form in  chart as items are completed):  Yes   Patient/family provided with [place] Work Department's list of facilities offering this level of care within the geographic area requested by the patient (or if unable, by the patient's family).  Yes   Patient/family informed of their freedom to choose among providers that offer the needed level of care, that participate in Medicare, Medicaid or managed care program needed by the patient, have an available bed and are willing to accept the patient.  Yes   Patient/family informed of [place]'s ownership interest in [facility] and [facility], as well as of the fact that they are under no obligation to receive care at these facilities.  PASRR submitted to EDS on 09/25/14     PASRR number received on 09/25/14     Existing PASRR number confirmed on       FL2 transmitted to all facilities in geographic area requested by pt/family on 09/25/14     FL2 transmitted to all facilities within larger geographic area on       Patient informed that his/her managed care company has contracts with or will negotiate with certain facilities, including the following:        Yes   Patient/family informed of bed offers received.  Patient chooses bed at  [facility])     Physician recommends and patient chooses bed at      Patient to be transferred to   on  .  Patient to be transferred to facility by       Patient family notified on   of transfer.  Name of family member notified:        PHYSICIAN       Additional Comment:    _______________________________________________ Luretha Rued,  LCSW 09/25/2014, 3:16 PM

## 2014-09-25 NOTE — Evaluation (Signed)
Occupational Therapy Evaluation Patient Details Name: Shannon [name] MRN: [id number] DOB: [birth year] Today's Date: 09/25/2014    History of Present Illness Pt is s/p fall with hip fracture and underwent IM nail.    Clinical Impression   Pt with decreased ability to adhere to NWB R LE so assisted with scoot transfer from chair to bed. Pt will need SNF at d/c. Will follow on acute to progress ADL independence and safety. Pt does displays some decreased ability to follow commands and requires increased time and multimodal cues for safety and completing tasks.     Follow Up Recommendations  SNF;Supervision/Assistance - 24 hour    Equipment Recommendations  3 in 1 bedside comode    Recommendations for Other Services       Precautions / Restrictions Precautions Precautions: Fall Restrictions Weight Bearing Restrictions: Yes RLE Weight Bearing: Non weight bearing      Mobility Bed Mobility Overal bed mobility: Needs Assistance Bed Mobility: Sit to Supine       Sit to supine: Max assist;+2 for physical assistance   General bed mobility comments: assist for LEs onto bed and to support shoulders/trunk down to bed.   Transfers Overall transfer level: Needs assistance Equipment used: Rolling walker (2 wheeled) Transfers: Lateral/Scoot Transfers;Sit to/from Stand Sit to Stand: Max assist;+2 physical assistance        Lateral/Scoot Transfers: Mod assist;+2 physical assistance General transfer comment: attempted standing X 2 but pt unable to maintain NWB R LE . Lateral scoot transfer from chair to bed with increased time, multimodal cues and +2 mod assist.     Balance                                            ADL Overall ADL's : Needs assistance/impaired Eating/Feeding: Independent;Sitting   Grooming: Wash/dry face;Set up;Sitting   Upper Body Bathing: Set up;Supervision/ safety;Sitting   Lower Body Bathing: +2 for physical assistance;Total  assistance;Sit to/from stand   Upper Body Dressing : Supervision/safety;Set up;Sitting   Lower Body Dressing: Total assistance;+2 for physical assistance;Sit to/from stand   Toilet Transfer: +2 for physical assistance;Moderate assistance Toilet Transfer Details (indicate cue type and reason): scoot from chair to bed. attempted standing with +2 max assist but unable to maintain NWB R LE Toileting- Clothing Manipulation and Hygiene: Total assistance;+2 for physical assistance;Sit to/from stand         General ADL Comments: Pt with difficulty sequencing tasks at times and following commands. Required multimodal cues to follow commands but unsure of baseline cognition. Pt stood with +2 max assist but unable to maintain NWB R LE. Pt then assisted to scoot from chair to bed with +2 mod assist. family present at end of session and stating plan is for SNF.      Vision     Perception     Praxis      Pertinent Vitals/Pain Pain Assessment: 0-10 Pain Score: 4  Pain Location: R leg Pain Descriptors / Indicators: Aching;Sore Pain Intervention(s): Repositioned;Monitored during session     Hand Dominance     Extremity/Trunk Assessment Upper Extremity Assessment Upper Extremity Assessment: Overall WFL for tasks assessed           Communication Communication Communication: No difficulties   Cognition Arousal/Alertness: Awake/alert Behavior During Therapy: WFL for tasks assessed/performed Overall Cognitive Status: No family/caregiver present to determine baseline cognitive functioning (pt slow with processing  commands and difficulty sequencing steps for tasks)       Memory: Decreased recall of precautions             General Comments       Exercises       Shoulder Instructions      Home Living Family/patient expects to be discharged to:: Private residence Living Arrangements: Spouse/significant other   Type of Home: House Home Access: Stairs to enter [employer] of Steps: 3 Entrance Stairs-Rails: Right;Left Home Layout: Able to live on main level with bedroom/bathroom;Laundry or work area in basement     [employer]: Occupational psychologist: Handicapped [place]: Civil engineer, contracting - built in;Walker - 2 wheels;Cane - single point          Prior Functioning/Environment Level of Independence: Independent with assistive device(s)             OT Diagnosis: Generalized weakness   OT Problem List: Decreased strength;Decreased knowledge of use of DME or AE;Decreased knowledge of precautions   OT Treatment/Interventions: Self-care/ADL training;Patient/family education;Therapeutic activities;DME and/or AE instruction    OT Goals(Current goals can be found in the care plan section) Acute Rehab OT Goals Patient Stated Goal: wants to walk OT Goal Formulation: With patient Time For Goal Achievement: 10/02/14 Potential to Achieve Goals: Good  OT Frequency: Min 2X/week   Barriers to D/C:            Co-evaluation              End of Session Equipment Utilized During Treatment: Rolling walker;Gait belt  Activity Tolerance: Other (comment) (slow with following commands and decreased adherence to NWB) Patient left: in bed;with call bell/phone within reach;with bed alarm set   Time: 2836-6294 OT Time Calculation (min): 19 min Charges:  OT General Charges $OT Visit: 1 Procedure OT Evaluation $Initial OT Evaluation Tier I: 1 Procedure G-Codes:    Jules Schick  [phone] 09/25/2014, 1:09 PM

## 2014-09-26 DIAGNOSIS — T45515A Adverse effect of anticoagulants, initial encounter: Secondary | ICD-10-CM

## 2014-09-26 DIAGNOSIS — I480 Paroxysmal atrial fibrillation: Secondary | ICD-10-CM

## 2014-09-26 DIAGNOSIS — D699 Hemorrhagic condition, unspecified: Secondary | ICD-10-CM

## 2014-09-26 LAB — BASIC METABOLIC PANEL
ANION GAP: 5 (ref 5–15)
BUN: 19 mg/dL (ref 6–20)
CALCIUM: 8.7 mg/dL — AB (ref 8.9–10.3)
CO2: 27 mmol/L (ref 22–32)
CREATININE: 0.56 mg/dL (ref 0.44–1.00)
Chloride: 106 mmol/L (ref 101–111)
GFR calc non Af Amer: >60 mL/min (ref >60)
Glucose, Bld: 119 mg/dL — ABNORMAL HIGH (ref 65–99)
POTASSIUM: 4.1 mmol/L (ref 3.5–5.1)
Sodium: 138 mmol/L (ref 135–145)

## 2014-09-26 LAB — CBC
HEMATOCRIT: 22.4 % — AB (ref 36.0–46.0)
HEMATOCRIT: 28.5 % — AB (ref 36.0–46.0)
Hemoglobin: 7.4 g/dL — ABNORMAL LOW (ref 12.0–15.0)
Hemoglobin: 9.4 g/dL — ABNORMAL LOW (ref 12.0–15.0)
MCH: 30.8 pg (ref 26.0–34.0)
MCH: 30.3 pg (ref 26.0–34.0)
MCHC: 33 g/dL (ref 30.0–36.0)
MCHC: 33 g/dL (ref 30.0–36.0)
MCV: 93.3 fL (ref 78.0–100.0)
MCV: 91.9 fL (ref 78.0–100.0)
PLATELETS: 124 10*3/uL — AB (ref 150–400)
Platelets: 150 10*3/uL (ref 150–400)
RBC: 2.4 MIL/uL — ABNORMAL LOW (ref 3.87–5.11)
RBC: 3.1 MIL/uL — ABNORMAL LOW (ref 3.87–5.11)
RDW: 14.6 % (ref 11.5–15.5)
RDW: 15.9 % — AB (ref 11.5–15.5)
WBC: 9.7 10*3/uL (ref 4.0–10.5)
WBC: 9.1 10*3/uL (ref 4.0–10.5)

## 2014-09-26 LAB — PREPARE RBC (CROSSMATCH)

## 2014-09-26 LAB — MRSA CULTURE

## 2014-09-26 LAB — PROTIME-INR
INR: 1.9 — AB (ref 0.00–1.49)
PROTHROMBIN TIME: 21.7 s — AB (ref 11.6–15.2)

## 2014-09-26 MED ORDER — LEVOTHYROXINE SODIUM 125 MCG PO TABS: 125.0000 ug | ORAL_TABLET | Freq: Every day | ORAL | Status: DC

## 2014-09-26 MED ORDER — DIPHENHYDRAMINE HCL 50 MG/ML IJ SOLN
25.0000 mg | Freq: Once | INTRAMUSCULAR | Status: AC
  Administered 2014-09-26: 25 mg via INTRAVENOUS
  Filled 2014-09-26: qty 1

## 2014-09-26 MED ORDER — ACETAMINOPHEN 500 MG PO TABS: 1000.0000 mg | ORAL_TABLET | Freq: Three times a day (TID) | ORAL | Status: DC

## 2014-09-26 MED ORDER — CHLORDIAZEPOXIDE HCL 5 MG PO CAPS: 5.0000 mg | ORAL_CAPSULE | Freq: Two times a day (BID) | ORAL | Status: DC | PRN

## 2014-09-26 MED ORDER — SODIUM CHLORIDE 0.9 % IV SOLN: Freq: Once | INTRAVENOUS | Status: DC

## 2014-09-26 MED ORDER — WARFARIN SODIUM 2.5 MG PO TABS
2.5000 mg | ORAL_TABLET | Freq: Once | ORAL | Status: DC
  Filled 2014-09-26: qty 1

## 2014-09-26 MED ORDER — TRAMADOL HCL 50 MG PO TABS: 50.0000 mg | ORAL_TABLET | Freq: Four times a day (QID) | ORAL | Status: DC | PRN

## 2014-09-26 MED ORDER — ZOLPIDEM TARTRATE 5 MG PO TABS: 5.0000 mg | ORAL_TABLET | Freq: Every evening | ORAL | Status: DC | PRN

## 2014-09-26 MED ORDER — FUROSEMIDE 10 MG/ML IJ SOLN
20.0000 mg | Freq: Once | INTRAMUSCULAR | Status: AC
  Administered 2014-09-26: 20 mg via INTRAVENOUS
  Filled 2014-09-26: qty 2

## 2014-09-26 MED ORDER — ACETAMINOPHEN 325 MG PO TABS: 650.0000 mg | ORAL_TABLET | Freq: Once | ORAL | Status: DC

## 2014-09-26 MED ORDER — POLYETHYLENE GLYCOL 3350 17 G PO PACK: 17.0000 g | PACK | Freq: Every day | ORAL | Status: AC

## 2014-09-26 NOTE — Care Management Note (Signed)
Case Management Note  Patient Details  Name: Shannon [name] MRN: [id number] Date of Birth: 1924/11/26  Subjective/Objective:                    Fracture, intertrochanteric, right femur: Action/Plan:  Discharge planning Expected Discharge Date:  09/26/14               Expected Discharge Plan:  Skilled Nursing Facility  In-House Referral:  Clinical Social Work  Discharge planning Services  CM Consult  Post Acute Care Choice:    Choice offered to:     DME Arranged:    DME Agency:     HH Arranged:    [place] Agency:     Status of Service:  Completed, signed off  Medicare Important Message Given:  Yes Date Medicare IM Given:  09/26/14 Medicare IM give by:  Mariane Masters, BSN, CM [phone] Date Additional Medicare IM Given:    Additional Medicare Important Message give by:     If discussed at [place] of Stay Meetings, dates discussed:    Additional Comments: CM notes pt is to go to SNF; CSW arranging.  IM gibven to pt.  No other CM needs were communicated. Dellie Catholic, RN 09/26/2014, 9:48 AM

## 2014-09-26 NOTE — Discharge Summary (Signed)
PATIENT DETAILS Name: Shannon [name] Age: 81 y.o. Sex: female Date of Birth: 06/02/1924 MRN: [id number]. Admitting Physician: Etta Quill, DO OVZ:CHYIF,OYDXAJ NEVILL, MD  Admit Date: 09/23/2014 Discharge date: 09/26/2014  Recommendations for Outpatient Follow-up:  1. Please repeat CBC and chemistries in 1 week. Patient transfused 1 unit of PRBC on 09/26/14. 2. Please check INR in 2 days-patient on Coumadin-INR 1.90 on 09/26/14.  3. Please ensure follow-up with orthopedics-Dr. Avanell Shackleton below.  PRIMARY DISCHARGE DIAGNOSIS:  Principal Problem:   Fracture, intertrochanteric, right femur Active Problems:   A-fib   Warfarin-induced coagulopathy   Closed right hip fracture      PAST MEDICAL HISTORY: Past Medical History  Diagnosis Date  . GERD (gastroesophageal reflux disease)   . Hypertension   . Hypothyroidism   . Cancer     left breast  . Hx of radiation therapy 2001    left breast    DISCHARGE MEDICATIONS: Current Discharge Medication List    START taking these medications   Details  acetaminophen (TYLENOL) 500 MG tablet Take 2 tablets (1,000 mg total) by mouth 3 (three) times daily. For 7 days from 09/26/14 Qty: 30 tablet, Refills: 0    polyethylene glycol (MIRALAX / GLYCOLAX) packet Take 17 g by mouth daily. Qty: 14 each, Refills: 0    traMADol (ULTRAM) 50 MG tablet Take 1 tablet (50 mg total) by mouth every 6 (six) hours as needed for moderate pain. Qty: 30 tablet, Refills: 0      CONTINUE these medications which have CHANGED   Details  chlordiazePOXIDE (LIBRIUM) 5 MG capsule Take 1 capsule (5 mg total) by mouth 2 (two) times daily as needed for anxiety. Qty: 15 capsule, Refills: 0    levothyroxine (SYNTHROID, LEVOTHROID) 125 MCG tablet Take 1 tablet (125 mcg total) by mouth daily before breakfast.    zolpidem (AMBIEN) 5 MG tablet Take 1 tablet (5 mg total) by mouth at bedtime as needed for sleep. Qty: 10 tablet, Refills: 0      CONTINUE these  medications which have NOT CHANGED   Details  atorvastatin (LIPITOR) 10 MG tablet Take 10 mg by mouth daily.    Cholecalciferol (VITAMIN D) 2000 UNITS tablet Take 2,000 Units by mouth daily.    [employer] STOOL SOFTENER 100 MG capsule Take 1 capsule by mouth 2 (two) times daily as needed for moderate constipation.  Refills: 11    diltiazem (CARDIZEM CD) 180 MG 24 hr capsule 180 mg daily.     famotidine (PEPCID) 20 MG tablet 20 mg See admin instructions. Takes 1 on Sunday, Tuesday, Thursday and Saturday.    furosemide (LASIX) 20 MG tablet Take 20 mg by mouth daily as needed (leg swelling).     MYRBETRIQ 50 MG TB24 Take 50 mg by mouth daily.     pantoprazole (PROTONIX) 40 MG tablet Take 40 mg by mouth daily.    propafenone (RYTHMOL) 150 MG tablet Take 150 mg by mouth 3 (three) times daily.    trimethoprim (TRIMPEX) 100 MG tablet Take 100 mg by mouth at bedtime.    vitamin B-12 (CYANOCOBALAMIN) 1000 MCG tablet Take 1,000 mcg by mouth daily.    warfarin (COUMADIN) 5 MG tablet 2.5-5 mg daily. Alternates between 2.5mg  (half-tab) and 5mg  daily.  Sunday, Tuesday, Thursday- 2.5mg  Monday, Wednesday, Friday, Saturday- 5mg         ALLERGIES:   Allergies  Allergen Reactions  . Demerol Nausea And Vomiting  . Keflet [Cephalexin] Nausea And Vomiting  . Sulfa Antibiotics Nausea  And Vomiting  . Tequin Nausea Only  . Penicillins Rash    BRIEF HPI:  See H&P, Labs, Consult and Test reports for all details in brief, patient is a 81 year old female with history of atrial fibrillation on Coumadin presented with a mechanical fall and subsequently found to have right hip fracture.  CONSULTATIONS:   orthopedic surgery  PERTINENT RADIOLOGIC STUDIES: Dg Chest Port 1 View  09/23/2014   CLINICAL DATA:  Status post fall. Concern for chest injury. Initial encounter.  EXAM: PORTABLE CHEST - 1 VIEW  COMPARISON:  Chest radiograph performed 07/04/2011  FINDINGS: The lungs are well-aerated. Pulmonary  vascularity is at the upper limits of normal. Mild peribronchial thickening is noted. There is no evidence of focal opacification, pleural effusion or pneumothorax.  The cardiomediastinal silhouette is borderline normal in size. No acute osseous abnormalities are seen.  IMPRESSION: Mild peribronchial thickening; lungs otherwise clear. No displaced rib fracture seen.   Electronically Signed   By: Garald Balding M.D.   On: 09/23/2014 22:52   Dg C-arm 1-60 Min-no Report  09/24/2014   CLINICAL DATA: ORIF right hip   C-ARM 1-60 MINUTES  Fluoroscopy was utilized by the requesting physician.  No radiographic  interpretation.    Dg Hip Port Unilat With Pelvis 1v Right  09/23/2014   CLINICAL DATA:  Slip and fall down steps at home earlier this day. Now with right hip pain.  EXAM: RIGHT HIP (WITH PELVIS) 1 VIEW PORTABLE  COMPARISON:  None.  FINDINGS: Comminuted angulated displaced intertrochanteric right femur fracture. No definite extension to the femoral neck. Femoral head remains seated in the acetabulum. Remainder of the bony pelvis is intact.  IMPRESSION: Comminuted angulated displaced intertrochanteric right femur fracture.   Electronically Signed   By: Jeb Levering M.D.   On: 09/23/2014 22:17   Dg Hip Unilat With Pelvis 2-3 Views Right  09/24/2014   CLINICAL DATA:  Fracture fixation  EXAM: DG C-ARM 1-60 MIN - NRPT [facility]; RIGHT HIP (WITH PELVIS) 2-3 VIEWS  COMPARISON:  09/23/2014  FINDINGS: Intertrochanteric fracture has been fixed with a compression screw and a long intra medullary rod extending to the distal femur. Fracture alignment and angulation are satisfactory. Hardware positioning is satisfactory.  IMPRESSION: Satisfactory fixation of right intertrochanteric fracture.   Electronically Signed   By: Franchot Gallo M.D.   On: 09/24/2014 14:58   Dg Femur 1v Right  09/24/2014   CLINICAL DATA:  Internal fixation of right hip fracture.  EXAM: RIGHT FEMUR 1 VIEW  COMPARISON:  09/23/2014  FINDINGS:  Multiple intraoperative spot images demonstrate internal fixation across the right femoral intertrochanteric fracture with anatomic alignment. No hardware complicating feature.  IMPRESSION: Internal fixation across right femoral intertrochanteric fracture. Anatomic alignment.   Electronically Signed   By: Rolm Baptise M.D.   On: 09/24/2014 13:48   Dg Femur Port, Min 2 Views Right  09/23/2014   CLINICAL DATA:  Right hip pain, fell coming down steps.  EXAM: RIGHT FEMUR PORTABLE 1 VIEW  COMPARISON:  Right hip series.  FINDINGS: There is a comminuted right femoral intertrochanteric fracture with varus angulation. No additional femoral abnormality. No subluxation or dislocation. Mild degenerative changes in the hips bilaterally.  IMPRESSION: Comminuted, angulated right femoral intertrochanteric fracture.   Electronically Signed   By: Rolm Baptise M.D.   On: 09/23/2014 22:08     PERTINENT LAB RESULTS: CBC:  Recent Labs  09/25/14 0520 09/26/14 0416  WBC 10.0 9.7  HGB 8.8* 7.4*  HCT 26.7* 22.4*  PLT 139* 124*   CMET CMP     Component Value Date/Time   NA 138 09/26/2014 0416   K 4.1 09/26/2014 0416   CL 106 09/26/2014 0416   CO2 27 09/26/2014 0416   GLUCOSE 119* 09/26/2014 0416   BUN 19 09/26/2014 0416   CREATININE 0.56 09/26/2014 0416   CALCIUM 8.7* 09/26/2014 0416   PROT 6.9 09/23/2014 2018   ALBUMIN 3.9 09/23/2014 2018   AST 25 09/23/2014 2018   ALT 19 09/23/2014 2018   ALKPHOS 73 09/23/2014 2018   BILITOT 0.7 09/23/2014 2018   GFRNONAA >60 09/26/2014 0416   GFRAA >60 09/26/2014 0416    GFR Estimated Creatinine Clearance: 45 mL/min (by C-G formula based on Cr of 0.56). No results for input(s): LIPASE, AMYLASE in the last 72 hours. No results for input(s): CKTOTAL, CKMB, CKMBINDEX, TROPONINI in the last 72 hours. Invalid input(s): POCBNP No results for input(s): DDIMER in the last 72 hours. No results for input(s): HGBA1C in the last 72 hours. No results for input(s): CHOL,  HDL, LDLCALC, TRIG, CHOLHDL, LDLDIRECT in the last 72 hours. No results for input(s): TSH, T4TOTAL, T3FREE, THYROIDAB in the last 72 hours.  Invalid input(s): FREET3 No results for input(s): VITAMINB12, FOLATE, FERRITIN, TIBC, IRON, RETICCTPCT in the last 72 hours. Coags:  Recent Labs  09/25/14 0520 09/26/14 0416  INR 1.46 1.90*   Microbiology: Recent Results (from the past 240 hour(s))  Surgical pcr screen     Status: Abnormal   Collection Time: 09/24/14  6:50 AM  Result Value Ref Range Status   MRSA, PCR INVALID RESULTS, SPECIMEN SENT FOR CULTURE (A) NEGATIVE Final    Comment: RESULT CALLED TO, READ BACK BY AND VERIFIED WITH: J LOVE AT 1345 ON 05.22.2016 BY NBROOKS    Staphylococcus aureus INVALID RESULTS, SPECIMEN SENT FOR CULTURE (A) NEGATIVE Final    Comment:        The Xpert SA Assay (FDA approved for NASAL specimens in patients over 18 years of age), is one component of a comprehensive surveillance program.  Test performance has been validated by [facility] for patients greater than or equal to 34 year old. It is not intended to diagnose infection nor to guide or monitor treatment.   MRSA culture     Status: None   Collection Time: 09/24/14 12:39 PM  Result Value Ref Range Status   Specimen Description NOSE  Final   Special Requests NONE  Final   Culture   Final    NO STAPHYLOCOCCUS AUREUS ISOLATED Note: NOMRSA Performed at [employer] [facility]    Report Status 09/26/2014 FINAL  Final     BRIEF HOSPITAL COURSE:  Fracture, intertrochanteric, right femur: Secondary to a mechanical fall. Evaluated by orthopedics, underwent open reduction and internal fixation on 5/22. On chronic Coumadin for atrial fibrillation and now for DVT prophylaxis. INR 1.90 on discharge, continue Coumadin and please follow INR closely-suggest recheck in at least 2 days.  Active Problems: Anemia: Secondary to perioperative blood loss. Hemoglobin on 5/24 down to 7.4, patient  transfused 1 unit of PRBC, subsequently repeat posttransfusion CBC at 9.4  Paroxysmal atrial fibrillation: Rate controlled, continue A-fib on diltiazem/rythmol. On Coumadin-reversed with vitamin K on admission for right hip repair-Coumadin has been resumed postoperatively, INR subtherapeutic at 1.90. Please check INR in 2 days.  Hypertension: Controlled with diltiazem. Follow  Hypothyroidism: Continue with levothyroxine.  Dyslipidemia: continue statin  History of breast cancer: Status post left breast surgery-and chemotherapy/radiation.  GERD: Continue PPI  TODAY-DAY OF DISCHARGE:  Subjective:   Ivonne Andrew today has no headache,no chest abdominal pain,no new weakness tingling or numbness. He looks much better, continues to have mild pain at the operative site.  Objective:   Blood pressure 122/59, pulse 81, temperature 99.4 F (37.4 C), temperature source Oral, resp. rate 16, height 5\' 3"  (1.6 m), weight 68.04 kg (150 lb), SpO2 95 %.  Intake/Output Summary (Last 24 hours) at 09/26/14 0954 Last data filed at 09/26/14 0600  Gross per 24 hour  Intake    480 ml  Output   1090 ml  Net   -610 ml   Filed Weights   09/23/14 1955  Weight: 68.04 kg (150 lb)    Exam Awake Alert, Oriented *3, No new F.N deficits, Normal affect [place].AT,PERRAL Supple Neck,No JVD, No cervical lymphadenopathy appriciated.  Symmetrical Chest wall movement, Good air movement bilaterally, CTAB RRR,No Gallops,Rubs or new Murmurs, No Parasternal Heave +ve B.Sounds, Abd Soft, Non tender, No organomegaly appriciated, No rebound -guarding or rigidity. No Cyanosis, Clubbing or edema, No new Rash or bruise  DISCHARGE CONDITION: Stable  DISPOSITION: SNF  DISCHARGE INSTRUCTIONS:    Activity:  As tolerated with Full fall precautions use walker/cane & assistance as needed-touchdown weightbearing to the right lower extremity only.  Diet recommendation: Heart Healthy diet  Discharge Instructions     Call MD / Call 911    Complete by:  As directed   If you experience chest pain or shortness of breath, CALL 911 and be transported to the hospital emergency room.  If you develope a fever above 101 F, pus (white drainage) or increased drainage or redness at the wound, or calf pain, call your surgeon's office.     Constipation Prevention    Complete by:  As directed   Drink plenty of fluids.  Prune juice may be helpful.  You may use a stool softener, such as Colace (over the counter) 100 mg twice a day.  Use MiraLax (over the counter) for constipation as needed.     Diet - low sodium heart healthy    Complete by:  As directed      Diet - low sodium heart healthy    Complete by:  As directed      Discharge instructions    Complete by:  As directed   Strict touch down weight bearing only right leg Keep incisions dry Return to [facility] 10 days     Increase activity slowly as tolerated    Complete by:  As directed      Increase activity slowly    Complete by:  As directed      Touch down weight bearing    Complete by:  As directed            Follow-up Information    Follow up with GATES,ROBERT NEVILL, MD. Schedule an appointment as soon as possible for a visit in 2 weeks.   Specialty:  Internal Medicine   Contact information:   [address] [place]  [address] [phone]       Follow up with Meredith Pel, MD. Schedule an appointment as soon as possible for a visit in 2 weeks.   Specialty:  Orthopedic Surgery   Contact information:   [address] [place]  [address] [phone]      Total Time spent on discharge equals  45 minutes.  SignedOren Binet 09/26/2014 9:54 AM

## 2014-09-26 NOTE — Progress Notes (Addendum)
ANTICOAGULATION CONSULT NOTE - F/U  Pharmacy Consult for coumadin Indication: VTE prophylaxis; hx afib  Allergies  Allergen Reactions  . Demerol Nausea And Vomiting  . Keflet [Cephalexin] Nausea And Vomiting  . Sulfa Antibiotics Nausea And Vomiting  . Tequin Nausea Only  . Penicillins Rash    Patient Measurements: Height: 5\' 3"  (160 cm) Weight: 150 lb (68.04 kg) IBW/kg (Calculated) : 52.4  Vital Signs: Temp: 98.7 F (37.1 C) (05/24 0613) Temp Source: Oral (05/24 0932) BP: 108/54 mmHg (05/24 6712) Pulse Rate: 78 (05/24 0613)  Labs:  Recent Labs  09/23/14 2018 09/24/14 0450 09/25/14 0520 09/26/14 0416  HGB 13.1  --  8.8* 7.4*  HCT 40.7  --  26.7* 22.4*  PLT 192  --  139* 124*  LABPROT 24.5* 21.0* 17.9* 21.7*  INR 2.23* 1.82* 1.46 1.90*  CREATININE 0.67  --  0.75 0.56    Estimated Creatinine Clearance: 45 mL/min (by C-G formula based on Cr of 0.56).   Medical History: Past Medical History  Diagnosis Date  . GERD (gastroesophageal reflux disease)   . Hypertension   . Hypothyroidism   . Cancer     left breast  . Hx of radiation therapy 2001    left breast   See MAR for inpatient warfarin doses administered  Assessment: 81 y.o F on coumadin PTA for hx afib. She presented to the ED on 5/21 after falling at home and was found to have right IT hip fractrure. She was reversed with vit K 5mg  IV x1 5/22 AM ~ 0100 and 1 unit of FFP in the OR at 1100 -- now s/p repair of right hip fracture on 5/22. To resume coumadin post-op.  Home dose: Patient reports dose as 5mg  daily except 2.5 mg on Tues/Thurs/Sun  Today, 09/26/2014: Seeing INR response after 2 doses warfarin INR 1.9 this AM. Hgb falling, 2/2 ABLA from surgery, order to transfuse 1 unit PRBC today per MD No other overt bleeding documented. Drug interactions:  Ciprofloxacin - can increase INR response to warfarin, course completed 5/23 PM  Propafenone - can increase INR response to warfarin, but pt on this  medication chronically so warfarin dosage was already adjusted for it   Goal of Therapy:  INR 2-3    Plan:  1. Warfarin 2.5 mg PO x 1 today at 1800, as per home regimen. 2. Daily PT/INR. 3. Monitor for s/s of bleeding.  Lindell Spar, PharmD, BCPS Pager: [phone] 09/26/2014 7:41 AM

## 2014-09-26 NOTE — Clinical Social Work Placement (Signed)
   CLINICAL SOCIAL WORK PLACEMENT  NOTE  Date:  09/26/2014  Patient Details  Name: Shannon [name] MRN: [id number] Date of Birth: 1925/01/09  Clinical Social Work is seeking post-discharge placement for this patient at the [place] level of care (*CSW will initial, date and re-position this form in  chart as items are completed):  Yes   Patient/family provided with [place] Work Department's list of facilities offering this level of care within the geographic area requested by the patient (or if unable, by the patient's family).  Yes   Patient/family informed of their freedom to choose among providers that offer the needed level of care, that participate in Medicare, Medicaid or managed care program needed by the patient, have an available bed and are willing to accept the patient.  Yes   Patient/family informed of [place]'s ownership interest in [facility] and [facility], as well as of the fact that they are under no obligation to receive care at these facilities.  PASRR submitted to EDS on 09/25/14     PASRR number received on 09/25/14     Existing PASRR number confirmed on       FL2 transmitted to all facilities in geographic area requested by pt/family on 09/25/14     FL2 transmitted to all facilities within larger geographic area on       Patient informed that his/her managed care company has contracts with or will negotiate with certain facilities, including the following:        Yes   Patient/family informed of bed offers received.  Patient chooses bed at  [facility])     Physician recommends and patient chooses bed at      Patient to be transferred to  [facility]) on 09/26/14.  Patient to be transferred to facility by [facility]     Patient family notified on 09/26/14 of transfer.  Name of family member notified:  [facility]     PHYSICIAN       Additional Comment: Pt / family are in agreement with d/c to SNF  today. [facility] transport is required. Pt / family are aware out of pocket costs may be associated with [facility] transport. NSG reviewed d/c summary, scripts, avs. Scripts included in d/c packet.   _______________________________________________ Luretha Rued, LCSW 09/26/2014, 3:47 PM

## 2014-09-27 LAB — TYPE AND SCREEN
ABO/RH(D): A POS
Antibody Screen: NEGATIVE
Unit division: 0

## 2015-03-21 ENCOUNTER — Telehealth: Payer: Self-pay | Admitting: Pharmacist Clinician (PhC)/ Clinical Pharmacy Specialist

## 2015-03-21 NOTE — Telephone Encounter (Signed)
Closed encounter °

## 2015-09-18 ENCOUNTER — Inpatient Hospital Stay (HOSPITAL_COMMUNITY)
Admission: EM | Admit: 2015-09-18 | Discharge: 2015-09-24 | DRG: 871 | Disposition: A | Discharge status: Skilled Nursing Facility | Payer: Medicare Other | Attending: Internal Medicine | Admitting: Internal Medicine

## 2015-09-18 ENCOUNTER — Emergency Department (HOSPITAL_COMMUNITY): Payer: Medicare Other

## 2015-09-18 ENCOUNTER — Encounter (HOSPITAL_COMMUNITY): Payer: Self-pay | Admitting: Emergency Medicine

## 2015-09-18 DIAGNOSIS — Z880 Allergy status to penicillin: Secondary | ICD-10-CM | POA: Diagnosis not present

## 2015-09-18 DIAGNOSIS — N390 Urinary tract infection, site not specified: Secondary | ICD-10-CM | POA: Diagnosis present

## 2015-09-18 DIAGNOSIS — R509 Fever, unspecified: Secondary | ICD-10-CM | POA: Diagnosis present

## 2015-09-18 DIAGNOSIS — I4891 Unspecified atrial fibrillation: Secondary | ICD-10-CM | POA: Diagnosis present

## 2015-09-18 DIAGNOSIS — E785 Hyperlipidemia, unspecified: Secondary | ICD-10-CM | POA: Diagnosis present

## 2015-09-18 DIAGNOSIS — F329 Major depressive disorder, single episode, unspecified: Secondary | ICD-10-CM | POA: Diagnosis present

## 2015-09-18 DIAGNOSIS — S065XAA Traumatic subdural hemorrhage with loss of consciousness status unknown, initial encounter: Secondary | ICD-10-CM | POA: Diagnosis present

## 2015-09-18 DIAGNOSIS — Z881 Allergy status to other antibiotic agents status: Secondary | ICD-10-CM | POA: Diagnosis not present

## 2015-09-18 DIAGNOSIS — Z853 Personal history of malignant neoplasm of breast: Secondary | ICD-10-CM

## 2015-09-18 DIAGNOSIS — Z66 Do not resuscitate: Secondary | ICD-10-CM | POA: Diagnosis present

## 2015-09-18 DIAGNOSIS — Z87891 Personal history of nicotine dependence: Secondary | ICD-10-CM | POA: Diagnosis not present

## 2015-09-18 DIAGNOSIS — Z923 Personal history of irradiation: Secondary | ICD-10-CM

## 2015-09-18 DIAGNOSIS — I10 Essential (primary) hypertension: Secondary | ICD-10-CM | POA: Diagnosis present

## 2015-09-18 DIAGNOSIS — Z7901 Long term (current) use of anticoagulants: Secondary | ICD-10-CM

## 2015-09-18 DIAGNOSIS — T502X5A Adverse effect of carbonic-anhydrase inhibitors, benzothiadiazides and other diuretics, initial encounter: Secondary | ICD-10-CM | POA: Diagnosis present

## 2015-09-18 DIAGNOSIS — E039 Hypothyroidism, unspecified: Secondary | ICD-10-CM | POA: Diagnosis present

## 2015-09-18 DIAGNOSIS — E876 Hypokalemia: Secondary | ICD-10-CM | POA: Diagnosis present

## 2015-09-18 DIAGNOSIS — A4159 Other Gram-negative sepsis: Secondary | ICD-10-CM | POA: Diagnosis present

## 2015-09-18 DIAGNOSIS — Z79899 Other long term (current) drug therapy: Secondary | ICD-10-CM | POA: Diagnosis not present

## 2015-09-18 DIAGNOSIS — S065X9A Traumatic subdural hemorrhage with loss of consciousness of unspecified duration, initial encounter: Secondary | ICD-10-CM | POA: Diagnosis present

## 2015-09-18 DIAGNOSIS — C801 Malignant (primary) neoplasm, unspecified: Secondary | ICD-10-CM | POA: Diagnosis present

## 2015-09-18 DIAGNOSIS — E8770 Fluid overload, unspecified: Secondary | ICD-10-CM | POA: Diagnosis present

## 2015-09-18 DIAGNOSIS — G9340 Encephalopathy, unspecified: Secondary | ICD-10-CM | POA: Diagnosis present

## 2015-09-18 DIAGNOSIS — Z885 Allergy status to narcotic agent status: Secondary | ICD-10-CM

## 2015-09-18 DIAGNOSIS — B961 Klebsiella pneumoniae [K. pneumoniae] as the cause of diseases classified elsewhere: Secondary | ICD-10-CM | POA: Diagnosis present

## 2015-09-18 DIAGNOSIS — K219 Gastro-esophageal reflux disease without esophagitis: Secondary | ICD-10-CM | POA: Diagnosis present

## 2015-09-18 DIAGNOSIS — Z882 Allergy status to sulfonamides status: Secondary | ICD-10-CM | POA: Diagnosis not present

## 2015-09-18 DIAGNOSIS — R6521 Severe sepsis with septic shock: Secondary | ICD-10-CM | POA: Diagnosis present

## 2015-09-18 DIAGNOSIS — I482 Chronic atrial fibrillation: Secondary | ICD-10-CM | POA: Diagnosis not present

## 2015-09-18 DIAGNOSIS — R0902 Hypoxemia: Secondary | ICD-10-CM

## 2015-09-18 DIAGNOSIS — A419 Sepsis, unspecified organism: Secondary | ICD-10-CM | POA: Diagnosis present

## 2015-09-18 DIAGNOSIS — F0390 Unspecified dementia without behavioral disturbance: Secondary | ICD-10-CM | POA: Diagnosis present

## 2015-09-18 DIAGNOSIS — N3000 Acute cystitis without hematuria: Secondary | ICD-10-CM | POA: Diagnosis not present

## 2015-09-18 HISTORY — DX: Unspecified atrial fibrillation: I48.91

## 2015-09-18 HISTORY — DX: Hyperlipidemia, unspecified: E78.5

## 2015-09-18 HISTORY — DX: Constipation, unspecified: K59.00

## 2015-09-18 LAB — I-STAT CHEM 8, ED
BUN: 18 mg/dL (ref 6–20)
Calcium, Ion: 1.22 mmol/L (ref 1.13–1.30)
Chloride: 107 mmol/L (ref 101–111)
Creatinine, Ser: 1 mg/dL (ref 0.44–1.00)
Glucose, Bld: 107 mg/dL — ABNORMAL HIGH (ref 65–99)
HEMATOCRIT: 42 % (ref 36.0–46.0)
HEMOGLOBIN: 14.3 g/dL (ref 12.0–15.0)
POTASSIUM: 3.4 mmol/L — AB (ref 3.5–5.1)
SODIUM: 142 mmol/L (ref 135–145)
TCO2: 20 mmol/L (ref 0–100)

## 2015-09-18 LAB — COMPREHENSIVE METABOLIC PANEL
ALBUMIN: 3.4 g/dL — AB (ref 3.5–5.0)
ALK PHOS: 79 U/L (ref 38–126)
ALT: 49 U/L (ref 14–54)
AST: 78 U/L — AB (ref 15–41)
Anion gap: 7 (ref 5–15)
BILIRUBIN TOTAL: 0.9 mg/dL (ref 0.3–1.2)
BUN: 19 mg/dL (ref 6–20)
CALCIUM: 9.8 mg/dL (ref 8.9–10.3)
CHLORIDE: 110 mmol/L (ref 101–111)
CO2: 22 mmol/L (ref 22–32)
Creatinine, Ser: 1.01 mg/dL — ABNORMAL HIGH (ref 0.44–1.00)
GFR calc Af Amer: 55 mL/min — ABNORMAL LOW (ref >60)
GFR calc non Af Amer: 48 mL/min — ABNORMAL LOW (ref >60)
Glucose, Bld: 112 mg/dL — ABNORMAL HIGH (ref 65–99)
POTASSIUM: 3.4 mmol/L — AB (ref 3.5–5.1)
Sodium: 139 mmol/L (ref 135–145)
Total Protein: 5.8 g/dL — ABNORMAL LOW (ref 6.5–8.1)

## 2015-09-18 LAB — URINALYSIS, ROUTINE W REFLEX MICROSCOPIC
Bilirubin Urine: NEGATIVE
GLUCOSE, UA: NEGATIVE mg/dL
KETONES UR: NEGATIVE mg/dL
NITRITE: POSITIVE — AB
PROTEIN: NEGATIVE mg/dL
Specific Gravity, Urine: 1.017 (ref 1.005–1.030)
pH: 7 (ref 5.0–8.0)

## 2015-09-18 LAB — CBC WITH DIFFERENTIAL/PLATELET
BASOS PCT: 0 %
Basophils Absolute: 0 10*3/uL (ref 0.0–0.1)
EOS ABS: 0 10*3/uL (ref 0.0–0.7)
Eosinophils Relative: 0 %
HEMATOCRIT: 38.4 % (ref 36.0–46.0)
HEMOGLOBIN: 12.8 g/dL (ref 12.0–15.0)
Lymphocytes Relative: 2 %
Lymphs Abs: 0.2 10*3/uL — ABNORMAL LOW (ref 0.7–4.0)
MCH: 30.4 pg (ref 26.0–34.0)
MCHC: 33.3 g/dL (ref 30.0–36.0)
MCV: 91.2 fL (ref 78.0–100.0)
MONOS PCT: 3 %
Monocytes Absolute: 0.4 10*3/uL (ref 0.1–1.0)
NEUTROS ABS: 10.8 10*3/uL — AB (ref 1.7–7.7)
NEUTROS PCT: 95 %
Platelets: 160 10*3/uL (ref 150–400)
RBC: 4.21 MIL/uL (ref 3.87–5.11)
RDW: 14.8 % (ref 11.5–15.5)
WBC: 11.4 10*3/uL — AB (ref 4.0–10.5)

## 2015-09-18 LAB — PROTIME-INR
INR: 2.63 — AB (ref 0.00–1.49)
PROTHROMBIN TIME: 27.7 s — AB (ref 11.6–15.2)

## 2015-09-18 LAB — I-STAT CG4 LACTIC ACID, ED: LACTIC ACID, VENOUS: 2.07 mmol/L — AB (ref 0.5–2.0)

## 2015-09-18 LAB — URINE MICROSCOPIC-ADD ON

## 2015-09-18 MED ORDER — DEXTROSE 5 % IV SOLN
1.0000 g | INTRAVENOUS | Status: DC
  Administered 2015-09-19: 1 g via INTRAVENOUS
  Filled 2015-09-18: qty 1

## 2015-09-18 MED ORDER — VANCOMYCIN HCL IN DEXTROSE 1-5 GM/200ML-% IV SOLN
1000.0000 mg | INTRAVENOUS | Status: AC
  Administered 2015-09-18: 1000 mg via INTRAVENOUS
  Filled 2015-09-18: qty 200

## 2015-09-18 MED ORDER — DEXTROSE 5 % IV SOLN
2.0000 g | INTRAVENOUS | Status: AC
  Administered 2015-09-18: 2 g via INTRAVENOUS
  Filled 2015-09-18: qty 2

## 2015-09-18 MED ORDER — VANCOMYCIN HCL IN DEXTROSE 1-5 GM/200ML-% IV SOLN
1000.0000 mg | INTRAVENOUS | Status: DC
  Administered 2015-09-19: 1000 mg via INTRAVENOUS
  Filled 2015-09-18: qty 200

## 2015-09-18 MED ORDER — SODIUM CHLORIDE 0.9 % IV BOLUS (SEPSIS)
1000.0000 mL | Freq: Once | INTRAVENOUS | Status: AC
  Administered 2015-09-18: 1000 mL via INTRAVENOUS

## 2015-09-18 NOTE — Progress Notes (Signed)
Pharmacy Antibiotic Note  Shannon [name] is a 82 y.o. female admitted on 09/18/2015 with sepsis. Reports vomiting and fever that started this afternoon, with accompanying loose stool. Pharmacy has been consulted for vancomycin and cefepime dosing. CXR clear; likely GI source but patient poor historian so not certain  Plan:  Vancomycin 1000 mg IV q24 hr; goal trough 15-20 mcg/mL  Measure vancomycin trough levels at steady state as indicated  Cefepime 2 g IV x 1, then 1g IV q24 hr  If GI source, consider narrowing to Rocephin/Flagyl, or Cipro/Flagyl if cephalosporins worsen N/V   Height: 5\' 4"  (162.6 cm) Weight: 140 lb (63.504 kg) IBW/kg (Calculated) : 54.7  Temp (24hrs), Avg:103.5 F (39.7 C), Min:103.5 F (39.7 C), Max:103.5 F (39.7 C)   Recent Labs Lab 09/18/15 2141 09/18/15 2202 09/18/15 2203  WBC 11.4*  --   --   CREATININE  --  1.00  --   LATICACIDVEN  --   --  2.07*    Estimated Creatinine Clearance: 32.9 mL/min (by C-G formula based on Cr of 1).    Allergies  Allergen Reactions  . Demerol Nausea And Vomiting  . Keflet [Cephalexin] Nausea And Vomiting  . Sulfa Antibiotics Nausea And Vomiting  . Tequin Nausea Only  . Penicillins Rash    Antimicrobials this admission: Vancomycin 5/16 >>  Cefepime 5/16 >>   Dose adjustments this admission: ---  Microbiology results: 2 yr Hx negative 5/16 BCx: sent 5/16 UCx: sent; UA pending   Thank you for allowing pharmacy to be a part of this patient's care.  Reuel Boom, PharmD, BCPS Pager: [phone] 09/18/2015, 10:17 PM

## 2015-09-18 NOTE — ED Notes (Signed)
Pt cleaned of stool and repeat rectal temp of 105.5 pt has not been evaluated by Hospitalist at this time. Linens changed and pt repositioned.

## 2015-09-18 NOTE — ED Provider Notes (Signed)
CSN: [id number]     Arrival date & time 09/18/15  2104 History   First MD Initiated Contact with Patient 09/18/15 2119     Chief Complaint  Patient presents with  . Code Sepsis   Level V caveat due to dementia.  The history is provided by the patient.  Patient presents from the nursing home with fever. Reportedly has had fevers up to 103 at the nursing home. Has been vomiting with some loose stool. Patient states she feels as if she's been run over by a truck. She says she has no appetite. Denies abdominal pain. Somewhat difficult to get history from.    Past Medical History  Diagnosis Date  . GERD (gastroesophageal reflux disease)   . Hypertension   . Hypothyroidism   . Cancer [facility])     left breast  . Hx of radiation therapy 2001    left breast  . A-fib ([place])   . Constipation   . HLD (hyperlipidemia)   . GERD (gastroesophageal reflux disease)    Past Surgical History  Procedure Laterality Date  . Breast surgery    . Intramedullary (im) nail intertrochanteric Right 09/24/2014    Procedure: INTRAMEDULLARY (IM) NAIL INTERTROCHANTRIC;  Surgeon: Meredith Pel, MD;  Location: [facility] ORS;  Service: Orthopedics;  Laterality: Right;   History reviewed. No pertinent family history. Social History  Substance Use Topics  . Smoking status: Former Smoker    Quit date: 05/05/1968  . Smokeless tobacco: Never Used  . Alcohol Use: No   OB History    No data available     Review of Systems  Unable to perform ROS: Dementia      Allergies  Demerol; Keflet; Sulfa antibiotics; Tequin; and Penicillins  Home Medications   Prior to Admission medications   Medication Sig Start Date End Date Taking? Authorizing Provider  anti-nausea (EMETROL) solution Take 15 mLs by mouth every 15 (fifteen) minutes as needed for nausea or vomiting.   Yes Historical Provider, MD  atorvastatin (LIPITOR) 10 MG tablet Take 10 mg by mouth at bedtime.    Yes Historical Provider, MD  Cholecalciferol  (VITAMIN D) 2000 UNITS tablet Take 2,000 Units by mouth daily.   Yes Historical Provider, MD  diltiazem (CARDIZEM CD) 180 MG 24 hr capsule Take 180 mg by mouth daily.  11/10/12  Yes Historical Provider, MD  levothyroxine (SYNTHROID, LEVOTHROID) 125 MCG tablet Take 1 tablet (125 mcg total) by mouth daily before breakfast. 09/26/14  Yes Shanker Kristeen Mans, MD  levothyroxine (SYNTHROID, LEVOTHROID) 137 MCG tablet Take 137 mcg by mouth daily before breakfast.  08/17/15  Yes Historical Provider, MD  MYRBETRIQ 50 MG TB24 Take 50 mg by mouth daily.  11/10/12  Yes Historical Provider, MD  Nutritional Supplements (COMPLETE NUTRITION PO) Take 1 Can by mouth 3 (three) times daily.   Yes Historical Provider, MD  pantoprazole (PROTONIX) 40 MG tablet Take 40 mg by mouth daily.   Yes Historical Provider, MD  polyethylene glycol (MIRALAX / GLYCOLAX) packet Take 17 g by mouth daily. 09/26/14  Yes Shanker Kristeen Mans, MD  promethazine (PHENERGAN) 12.5 MG tablet Take 12.5 mg by mouth every 6 (six) hours as needed for nausea or vomiting.   Yes Historical Provider, MD  propafenone (RYTHMOL) 150 MG tablet Take 150 mg by mouth 3 (three) times daily.   Yes Historical Provider, MD  sertraline (ZOLOFT) 25 MG tablet Take 25 mg by mouth daily.  09/17/15  Yes Historical Provider, MD  trimethoprim (TRIMPEX) 100 MG  tablet Take 100 mg by mouth at bedtime.   Yes Historical Provider, MD  vitamin B-12 (CYANOCOBALAMIN) 1000 MCG tablet Take 1,000 mcg by mouth daily.   Yes Historical Provider, MD  warfarin (COUMADIN) 2.5 MG tablet Take 2.5 mg by mouth as directed. Take 1 tablet (2.5 mg) on Tues, Thurs, Sat and Sun. 09/10/15  Yes Historical Provider, MD  warfarin (COUMADIN) 5 MG tablet Take 5 mg by mouth as directed. Take 1 tablet(5 mg) on Mon, Wed, Fri 10/23/12  Yes Historical Provider, MD  acetaminophen (TYLENOL) 500 MG tablet Take 2 tablets (1,000 mg total) by mouth 3 (three) times daily. For 7 days from 09/26/14 Patient not taking: Reported on  09/18/2015 09/26/14   Jonetta Osgood, MD  chlordiazePOXIDE (LIBRIUM) 5 MG capsule Take 1 capsule (5 mg total) by mouth 2 (two) times daily as needed for anxiety. Patient not taking: Reported on 09/18/2015 09/26/14   Jonetta Osgood, MD  [employer] STOOL SOFTENER 100 MG capsule Take 1 capsule by mouth 2 (two) times daily as needed for moderate constipation.  06/28/14   Historical Provider, MD  furosemide (LASIX) 20 MG tablet Take 20 mg by mouth daily as needed (leg swelling).     Historical Provider, MD  traMADol (ULTRAM) 50 MG tablet Take 1 tablet (50 mg total) by mouth every 6 (six) hours as needed for moderate pain. 09/26/14   Shanker Kristeen Mans, MD  zolpidem (AMBIEN) 5 MG tablet Take 1 tablet (5 mg total) by mouth at bedtime as needed for sleep. 09/26/14   Shanker Kristeen Mans, MD   BP 124/52 mmHg  Pulse 105  Temp(Src) 98.1 F (36.7 C) (Axillary)  Resp 22  Ht 5\' 4"  (1.626 m)  Wt 149 lb 11.1 oz (67.9 kg)  BMI 25.68 kg/m2  SpO2 94% Physical Exam  Constitutional: She appears well-developed.  Eyes: Pupils are equal, round, and reactive to light.  Neck: Neck supple.  Cardiovascular:  Murmur heard. Tachycardia  Pulmonary/Chest: No respiratory distress.  Abdominal: She exhibits distension.  Mild distention without palpated hernia. Does not appear tender.  Neurological: She is alert.  Awake and answers questions but somewhat confused.  Skin: Skin is warm.    ED Course  Procedures (including critical care time) Labs Review Labs Reviewed  BLOOD CULTURE ID PANEL (REFLEXED) - Abnormal; Notable for the following:    Enterobacteriaceae species DETECTED (*)    Klebsiella pneumoniae DETECTED (*)    All other components within normal limits  COMPREHENSIVE METABOLIC PANEL - Abnormal; Notable for the following:    Potassium 3.4 (*)    Glucose, Bld 112 (*)    Creatinine, Ser 1.01 (*)    Total Protein 5.8 (*)    Albumin 3.4 (*)    AST 78 (*)    GFR calc non Af Amer 48 (*)    GFR calc Af Amer 55 (*)     All other components within normal limits  CBC WITH DIFFERENTIAL/PLATELET - Abnormal; Notable for the following:    WBC 11.4 (*)    Neutro Abs 10.8 (*)    Lymphs Abs 0.2 (*)    All other components within normal limits  PROTIME-INR - Abnormal; Notable for the following:    Prothrombin Time 27.7 (*)    INR 2.63 (*)    All other components within normal limits  URINALYSIS, ROUTINE W REFLEX MICROSCOPIC (NOT AT [facility]) - Abnormal; Notable for the following:    APPearance CLOUDY (*)    Hgb urine dipstick MODERATE (*)  Nitrite POSITIVE (*)    Leukocytes, UA MODERATE (*)    All other components within normal limits  URINE MICROSCOPIC-ADD ON - Abnormal; Notable for the following:    Squamous Epithelial / LPF 6-30 (*)    Bacteria, UA MANY (*)    All other components within normal limits  MAGNESIUM - Abnormal; Notable for the following:    Magnesium 1.3 (*)    All other components within normal limits  COMPREHENSIVE METABOLIC PANEL - Abnormal; Notable for the following:    Potassium 3.0 (*)    Chloride 118 (*)    CO2 17 (*)    Glucose, Bld 112 (*)    Creatinine, Ser 1.06 (*)    Calcium 8.3 (*)    Total Protein 4.8 (*)    Albumin 2.6 (*)    AST 53 (*)    GFR calc non Af Amer 45 (*)    GFR calc Af Amer 52 (*)    All other components within normal limits  CBC - Abnormal; Notable for the following:    WBC 16.6 (*)    RBC 3.78 (*)    Hemoglobin 11.5 (*)    HCT 34.5 (*)    Platelets 111 (*)    All other components within normal limits  LACTIC ACID, PLASMA - Abnormal; Notable for the following:    Lactic Acid, Venous 3.0 (*)    All other components within normal limits  LACTIC ACID, PLASMA - Abnormal; Notable for the following:    Lactic Acid, Venous 2.7 (*)    All other components within normal limits  APTT - Abnormal; Notable for the following:    aPTT 51 (*)    All other components within normal limits  TROPONIN I - Abnormal; Notable for the following:    Troponin I 0.43 (*)     All other components within normal limits  MAGNESIUM - Abnormal; Notable for the following:    Magnesium 1.4 (*)    All other components within normal limits  PHOSPHORUS - Abnormal; Notable for the following:    Phosphorus 2.0 (*)    All other components within normal limits  PROTIME-INR - Abnormal; Notable for the following:    Prothrombin Time 32.3 (*)    INR 3.23 (*)    All other components within normal limits  GLUCOSE, CAPILLARY - Abnormal; Notable for the following:    Glucose-Capillary 103 (*)    All other components within normal limits  I-STAT CG4 LACTIC ACID, ED - Abnormal; Notable for the following:    Lactic Acid, Venous 2.07 (*)    All other components within normal limits  I-STAT CHEM 8, ED - Abnormal; Notable for the following:    Potassium 3.4 (*)    Glucose, Bld 107 (*)    All other components within normal limits  I-STAT CG4 LACTIC ACID, ED - Abnormal; Notable for the following:    Lactic Acid, Venous 7.44 (*)    All other components within normal limits  CULTURE, BLOOD (ROUTINE X 2)  CULTURE, BLOOD (ROUTINE X 2)  MRSA PCR SCREENING  URINE CULTURE  PROCALCITONIN  TROPONIN I  TROPONIN I  LACTIC ACID, PLASMA    Imaging Review Dg Chest Port 1 View  09/19/2015  CLINICAL DATA:  Hypoxia and wheezing today.  Initial encounter. EXAM: PORTABLE CHEST 1 VIEW COMPARISON:  Single-view of the chest 09/18/2015 and 09/23/2014. FINDINGS: There is a small left pleural effusion with basilar airspace disease. The right lung is clear. Lung volumes  are somewhat low. Heart size is upper normal. No pneumothorax. IMPRESSION: Small left pleural effusion and basilar airspace disease which could be atelectasis or pneumonia. Electronically Signed   By: Inge Rise M.D.   On: 09/19/2015 10:42   Dg Chest Portable 1 View  09/18/2015  CLINICAL DATA:  Fever and weakness. EXAM: PORTABLE CHEST 1 VIEW COMPARISON:  09/23/2014 FINDINGS: Shallow inspiration. Mild cardiac enlargement.  Pulmonary vascularity is mildly prominent but there is no evidence of edema or consolidation. No blunting of costophrenic angles. No pneumothorax. Calcified and tortuous aorta. Degenerative changes in the spine and shoulders IMPRESSION: Cardiac enlargement with mild pulmonary vascular congestion. No consolidation or edema. Electronically Signed   By: Lucienne Capers M.D.   On: 09/18/2015 21:59   I have personally reviewed and evaluated these images and lab results as part of my medical decision-making.   EKG Interpretation   Date/Time:  Tuesday Sep 18 2015 21:25:44 EDT Ventricular Rate:  107 PR Interval:  172 QRS Duration: 97 QT Interval:  334 QTC Calculation: 446 R Axis:   -47 Text Interpretation:  Sinus tachycardia Probable left atrial enlargement  Left anterior fascicular block Low voltage, precordial leads Abnormal  R-wave progression, early transition Confirmed by Alvino Chapel  MD, Ovid Curd  [phone]) on 09/18/2015 9:55:28 PM      MDM   Final diagnoses:  Lower urinary tract infection    Patient presented with sepsis from urinary tract infection. Lactic acid is reassuring. Has had some mental status changes and is tachycardic. IV fluids given. Antibiotics given. Cultures sent. Admit to stepdown bed. Has sinus tachycardia but history of A. Fib.  CRITICAL CARE Performed by: Mackie Pai Total critical care time: 30 minutes Critical care time was exclusive of separately billable procedures and treating other patients. Critical care was necessary to treat or prevent imminent or life-threatening deterioration. Critical care was time spent personally by me on the following activities: development of treatment plan with patient and/or surrogate as well as nursing, discussions with consultants, evaluation of patient's response to treatment, examination of patient, obtaining history from patient or surrogate, ordering and performing treatments and interventions, ordering and review of  laboratory studies, ordering and review of radiographic studies, pulse oximetry and re-evaluation of patient's condition.     Davonna Belling, MD 09/19/15 1130

## 2015-09-18 NOTE — ED Notes (Signed)
MD at bedside. 

## 2015-09-18 NOTE — ED Notes (Signed)
Per EMS pt sent from [facility] for fever and vomiting that started at 1500 today. EMS stated that staff had administered Tylenol Suppository for initial fever of 102.9 and recheck was 103.1. Pt was given Emesol at facility x2 doses and Phenergan IM for nausea and vomiting. Staff reported loose stools and increased confusion today. Staff advised food particles in emesis. EMS started 18ga to left AC and appx 353ml NS administered enroute.

## 2015-09-18 NOTE — ED Notes (Signed)
Bed: RESB Expected date:  Expected time:  Means of arrival:  Comments: Septic pt

## 2015-09-18 NOTE — H&P (Signed)
History and Physical    Shannon [name] [id number] DOB: March 21, 1925 DOA: 09/18/2015  Referring MD/NP/PA:   PCP: [name], MD   Patient coming from:  SNF. At his baseline, pt can not do her ADL, but is alert and can use wheel chair.  Chief Complaint: Fever, nausea, vomiting, altered mental status.  HPI: Shannon [name] is a 82 y.o. female with medical history significant of atrial fibrillation on Coumadin, hypertension, hyperlipidemia, GERD, hypothyroidism, depression, left breast cancer 2011 (s/p of XRT and surgery per her son), who presents with fever, nausea, vomiting and altered mental status.  Pt is from nursing home. At her normal baseline, patient is alert, able to talk and use wheelchair to walk. Per patient's son and husband, pt was noted to be confused and developed fever today. She was reported to have fevers up to 103 at the nursing home. Pt has nausea and vomited several times. She also had some loose stool, not very sure if pt has true diarrhea. Per her son, patient had history of a right hip fracture, and complaints of right hip discomfort today. Her son does not think patient had any fall recently. Not sure if patient has any chest pain, abdominal pain or symptoms of UTI. She moves all extremities. Not have cough or respiratory distress in the emergency room.  ED Course: pt was found to have positive urinalysis for UTI, lactate 2.07-->7.4, WBC 11.4, INR 2.63, temperature 103.5, tachycardia, tachypnea, oxygen saturation 94%, potassium 3.4, creatinine 1.01. Chest x-ray showed pulmonary vascular congestion, but no infiltration. Initially bp was 103/63, but dropped to 88/46 later. Pt is admitted to SDU as inpt. PCCM,, Dr. Chase Caller was consulted-->started Neo pressor.  Review of Systems: Could not be reviewed due to altered mental status.  Allergy:  Allergies  Allergen Reactions  . Demerol Nausea And Vomiting  . Keflet [Cephalexin] Nausea And Vomiting  . Sulfa  Antibiotics Nausea And Vomiting  . Tequin Nausea Only  . Penicillins Rash    Past Medical History  Diagnosis Date  . GERD (gastroesophageal reflux disease)   . Hypertension   . Hypothyroidism   . Cancer [facility])     left breast  . Hx of radiation therapy 2001    left breast  . A-fib ([place])   . Constipation   . HLD (hyperlipidemia)   . GERD (gastroesophageal reflux disease)     Past Surgical History  Procedure Laterality Date  . Breast surgery    . Intramedullary (im) nail intertrochanteric Right 09/24/2014    Procedure: INTRAMEDULLARY (IM) NAIL INTERTROCHANTRIC;  Surgeon: Meredith Pel, MD;  Location: [facility] ORS;  Service: Orthopedics;  Laterality: Right;    Social History:  reports that she quit smoking about 47 years ago. She has never used smokeless tobacco. She reports that she does not drink alcohol or use illicit drugs.  Family History: Could not be reviewed due to altered mental status.  Prior to Admission medications   Medication Sig Start Date End Date Taking? Authorizing Provider  anti-nausea (EMETROL) solution Take 15 mLs by mouth every 15 (fifteen) minutes as needed for nausea or vomiting.   Yes Historical Provider, MD  atorvastatin (LIPITOR) 10 MG tablet Take 10 mg by mouth at bedtime.    Yes Historical Provider, MD  Cholecalciferol (VITAMIN D) 2000 UNITS tablet Take 2,000 Units by mouth daily.   Yes Historical Provider, MD  diltiazem (CARDIZEM CD) 180 MG 24 hr capsule Take 180 mg by mouth daily.  11/10/12  Yes Historical Provider,  MD  levothyroxine (SYNTHROID, LEVOTHROID) 125 MCG tablet Take 1 tablet (125 mcg total) by mouth daily before breakfast. 09/26/14  Yes Shanker Kristeen Mans, MD  levothyroxine (SYNTHROID, LEVOTHROID) 137 MCG tablet Take 137 mcg by mouth daily before breakfast.  08/17/15  Yes Historical Provider, MD  MYRBETRIQ 50 MG TB24 Take 50 mg by mouth daily.  11/10/12  Yes Historical Provider, MD  Nutritional Supplements (COMPLETE NUTRITION PO) Take 1 Can by  mouth 3 (three) times daily.   Yes Historical Provider, MD  pantoprazole (PROTONIX) 40 MG tablet Take 40 mg by mouth daily.   Yes Historical Provider, MD  polyethylene glycol (MIRALAX / GLYCOLAX) packet Take 17 g by mouth daily. 09/26/14  Yes Shanker Kristeen Mans, MD  promethazine (PHENERGAN) 12.5 MG tablet Take 12.5 mg by mouth every 6 (six) hours as needed for nausea or vomiting.   Yes Historical Provider, MD  propafenone (RYTHMOL) 150 MG tablet Take 150 mg by mouth 3 (three) times daily.   Yes Historical Provider, MD  sertraline (ZOLOFT) 25 MG tablet Take 25 mg by mouth daily.  09/17/15  Yes Historical Provider, MD  trimethoprim (TRIMPEX) 100 MG tablet Take 100 mg by mouth at bedtime.   Yes Historical Provider, MD  vitamin B-12 (CYANOCOBALAMIN) 1000 MCG tablet Take 1,000 mcg by mouth daily.   Yes Historical Provider, MD  warfarin (COUMADIN) 2.5 MG tablet Take 2.5 mg by mouth as directed. Take 1 tablet (2.5 mg) on Tues, Thurs, Sat and Sun. 09/10/15  Yes Historical Provider, MD  warfarin (COUMADIN) 5 MG tablet Take 5 mg by mouth as directed. Take 1 tablet(5 mg) on Mon, Wed, Fri 10/23/12  Yes Historical Provider, MD  acetaminophen (TYLENOL) 500 MG tablet Take 2 tablets (1,000 mg total) by mouth 3 (three) times daily. For 7 days from 09/26/14 Patient not taking: Reported on 09/18/2015 09/26/14   Jonetta Osgood, MD  chlordiazePOXIDE (LIBRIUM) 5 MG capsule Take 1 capsule (5 mg total) by mouth 2 (two) times daily as needed for anxiety. Patient not taking: Reported on 09/18/2015 09/26/14   Jonetta Osgood, MD  [employer] STOOL SOFTENER 100 MG capsule Take 1 capsule by mouth 2 (two) times daily as needed for moderate constipation.  06/28/14   Historical Provider, MD  furosemide (LASIX) 20 MG tablet Take 20 mg by mouth daily as needed (leg swelling).     Historical Provider, MD  traMADol (ULTRAM) 50 MG tablet Take 1 tablet (50 mg total) by mouth every 6 (six) hours as needed for moderate pain. 09/26/14   Shanker Kristeen Mans,  MD  zolpidem (AMBIEN) 5 MG tablet Take 1 tablet (5 mg total) by mouth at bedtime as needed for sleep. 09/26/14   Shanker Kristeen Mans, MD    Physical Exam: Filed Vitals:   09/19/15 [id number] 09/19/15 0430 09/19/15 0445 09/19/15 0500  BP: 84/44 90/50 88/49  81/49  Pulse: 100 97 97 96  Temp:      TempSrc:      Resp: 27 23 31 19   Height:      Weight:      SpO2: 94% 95% 94% 95%   General: Not in acute distress HEENT:       Eyes: PERRL, EOMI, no scleral icterus.       ENT: No discharge from the ears and nose, no pharynx injection, no tonsillar enlargement.        Neck: No JVD, no bruit, no mass felt. Heme: No neck lymph node enlargement. Cardiac: S1/S2, RRR, Tachycardia, No murmurs,  No gallops or rubs. Pulm: No rales, wheezing, rhonchi or rubs. Abd: Soft, nondistended, nontender, no rebound pain, no organomegaly, BS present. GU: No hematuria Ext: No pitting leg edema bilaterally. 2+DP/PT pulse bilaterally. Musculoskeletal: No joint deformities, No joint redness or warmth, no limitation of ROM in spin. Skin: No rashes.  Neuro:confused, not oriented X3, cranial nerves II-XII grossly intact, moves all extremities. Psych: Patient is not psychotic, no suicidal or hemocidal ideation.  Labs on Admission: I have personally reviewed following labs and imaging studies  CBC:  Recent Labs Lab 09/18/15 2141 09/18/15 2202  WBC 11.4*  --   NEUTROABS 10.8*  --   HGB 12.8 14.3  HCT 38.4 42.0  MCV 91.2  --   PLT 160  --    Basic Metabolic Panel:  Recent Labs Lab 09/18/15 2141 09/18/15 2202 09/19/15 0321  NA 139 142  --   K 3.4* 3.4*  --   CL 110 107  --   CO2 22  --   --   GLUCOSE 112* 107*  --   BUN 19 18  --   CREATININE 1.01* 1.00  --   CALCIUM 9.8  --   --   MG  --   --  1.3*   GFR: Estimated Creatinine Clearance: 36.1 mL/min (by C-G formula based on Cr of 1). Liver Function Tests:  Recent Labs Lab 09/18/15 2141  AST 78*  ALT 49  ALKPHOS 79  BILITOT 0.9  PROT 5.8*    ALBUMIN 3.4*   No results for input(s): LIPASE, AMYLASE in the last 168 hours. No results for input(s): AMMONIA in the last 168 hours. Coagulation Profile:  Recent Labs Lab 09/18/15 2141  INR 2.63*   Cardiac Enzymes: No results for input(s): CKTOTAL, CKMB, CKMBINDEX, TROPONINI in the last 168 hours. BNP (last 3 results) No results for input(s): PROBNP in the last 8760 hours. HbA1C: No results for input(s): HGBA1C in the last 72 hours. CBG: No results for input(s): GLUCAP in the last 168 hours. Lipid Profile: No results for input(s): CHOL, HDL, LDLCALC, TRIG, CHOLHDL, LDLDIRECT in the last 72 hours. Thyroid Function Tests: No results for input(s): TSH, T4TOTAL, FREET4, T3FREE, THYROIDAB in the last 72 hours. Anemia Panel: No results for input(s): VITAMINB12, FOLATE, FERRITIN, TIBC, IRON, RETICCTPCT in the last 72 hours. Urine analysis:    Component Value Date/Time   COLORURINE YELLOW 09/18/2015 2202   APPEARANCEUR CLOUDY* 09/18/2015 2202   LABSPEC 1.017 09/18/2015 2202   PHURINE 7.0 09/18/2015 2202   GLUCOSEU NEGATIVE 09/18/2015 2202   HGBUR MODERATE* 09/18/2015 2202   BILIRUBINUR NEGATIVE 09/18/2015 2202   KETONESUR NEGATIVE 09/18/2015 2202   PROTEINUR NEGATIVE 09/18/2015 2202   UROBILINOGEN 0.2 09/23/2014 2201   NITRITE POSITIVE* 09/18/2015 2202   LEUKOCYTESUR MODERATE* 09/18/2015 2202   Sepsis Labs: @LABRCNTIP (procalcitonin:4,lacticidven:4) )No results found for this or any previous visit (from the past 240 hour(s)).   Radiological Exams on Admission: Dg Chest Portable 1 View  09/18/2015  CLINICAL DATA:  Fever and weakness. EXAM: PORTABLE CHEST 1 VIEW COMPARISON:  09/23/2014 FINDINGS: Shallow inspiration. Mild cardiac enlargement. Pulmonary vascularity is mildly prominent but there is no evidence of edema or consolidation. No blunting of costophrenic angles. No pneumothorax. Calcified and tortuous aorta. Degenerative changes in the spine and shoulders IMPRESSION:  Cardiac enlargement with mild pulmonary vascular congestion. No consolidation or edema. Electronically Signed   By: Lucienne Capers M.D.   On: 09/18/2015 21:59     EKG: Independently reviewed. Sinus rhythm, QTC 446,  LAD.   Assessment/Plan Principal Problem:   UTI (urinary tract infection) Active Problems:   SDH (subdural hematoma) (HCC)   A-fib (HCC)   Hypothyroidism   Cancer (HCC)   Sepsis (HCC)   HLD (hyperlipidemia)   GERD (gastroesophageal reflux disease)   Hypokalemia   UTI (lower urinary tract infection)   Gastroesophageal reflux disease without esophagitis   Acute encephalopathy   UTI and severe sepsis: Patient's nausea, vomiting, fever and altered mental status are likely caused by UTI and sepsis. She has positive urinalysis for UTI. She has seveve sepsis with leukocytosis, fever, tachycardia, tachypnea and elevated lactate up to 7.4. Bp dropped to 88/46. PCCM, Dr. Chase Caller was consulted-->>start Neo pressor.  -Pt is admitted to SDU -On Neo pressor per PCCM -IV Vanco and cefepime were started in the ED, will continue -follow up Bx and Ux -check pro-calcitonin and lactate level q3h per sepsis protocol -IVF: totally 4.5 L NS and then 100 cc/h -When necessary Zofran for nausea  Acute encephalopathy: Most likely due to UTI and sepsis -Treat underlying problems as above -Frequent neuro check  Hypothyroidism: TSH was 1.110on 12/06/09. -Switch oral hypothyroidism to IV, cut down dose to 75 mcg daily  Atrial Fibrillation: CHA2DS2-VASc Score is 4, needs oral anticoagulation. Patient is on Coumadin. INR is 2.63 on admission. Heart rate is ~110 -Hold Coumadin due to AMS -IV metoprolol when necessary for RVR (with holding parameter)  HTN: has hypotension -Hold lasix due to sepsis  GERD: -switch protonix to IV Pepcid (not sure if pt has true diarrhea, if she has a diarrhea needed to rule out C. difficile colitis).  Hypokalemia: Potassium is 3.4. -Repleted -Check  magnesium level  HLD: Last LDL was 73 on 12/07/15 -hold hole medications: Lipitor due to AMS   DVT ppx: SCD Code Status: DNR Family Communication: Yes, patient's husband and son at bed side Disposition Plan:  Anticipate discharge back to previous SNF Consults called:  PCCM, Dr. Chase Caller Admission status:  SDU/inpation       Date of Service 09/19/2015    Ivor Costa [facility] Pager [phone]  If 7PM-7AM, please contact night-coverage [email or web address] Password [email or web address] 09/19/2015, 5:05 AM

## 2015-09-19 ENCOUNTER — Encounter (HOSPITAL_COMMUNITY): Payer: Self-pay | Admitting: Internal Medicine

## 2015-09-19 ENCOUNTER — Inpatient Hospital Stay (HOSPITAL_COMMUNITY): Payer: Medicare Other

## 2015-09-19 DIAGNOSIS — E876 Hypokalemia: Secondary | ICD-10-CM | POA: Diagnosis present

## 2015-09-19 DIAGNOSIS — N390 Urinary tract infection, site not specified: Secondary | ICD-10-CM | POA: Diagnosis present

## 2015-09-19 DIAGNOSIS — A419 Sepsis, unspecified organism: Secondary | ICD-10-CM | POA: Diagnosis present

## 2015-09-19 DIAGNOSIS — K219 Gastro-esophageal reflux disease without esophagitis: Secondary | ICD-10-CM

## 2015-09-19 DIAGNOSIS — E785 Hyperlipidemia, unspecified: Secondary | ICD-10-CM | POA: Diagnosis present

## 2015-09-19 DIAGNOSIS — N3000 Acute cystitis without hematuria: Secondary | ICD-10-CM

## 2015-09-19 DIAGNOSIS — G9340 Encephalopathy, unspecified: Secondary | ICD-10-CM

## 2015-09-19 LAB — BLOOD CULTURE ID PANEL (REFLEXED)
Acinetobacter baumannii: NOT DETECTED
CANDIDA ALBICANS: NOT DETECTED
CANDIDA KRUSEI: NOT DETECTED
CANDIDA PARAPSILOSIS: NOT DETECTED
CANDIDA TROPICALIS: NOT DETECTED
Candida glabrata: NOT DETECTED
Carbapenem resistance: NOT DETECTED
ENTEROBACTER CLOACAE COMPLEX: NOT DETECTED
ENTEROCOCCUS SPECIES: NOT DETECTED
Enterobacteriaceae species: DETECTED — AB
Escherichia coli: NOT DETECTED
Haemophilus influenzae: NOT DETECTED
KLEBSIELLA PNEUMONIAE: DETECTED — AB
Klebsiella oxytoca: NOT DETECTED
Listeria monocytogenes: NOT DETECTED
Methicillin resistance: NOT DETECTED
Neisseria meningitidis: NOT DETECTED
PROTEUS SPECIES: NOT DETECTED
Pseudomonas aeruginosa: NOT DETECTED
STAPHYLOCOCCUS AUREUS BCID: NOT DETECTED
STREPTOCOCCUS PNEUMONIAE: NOT DETECTED
Serratia marcescens: NOT DETECTED
Staphylococcus species: NOT DETECTED
Streptococcus agalactiae: NOT DETECTED
Streptococcus pyogenes: NOT DETECTED
Streptococcus species: NOT DETECTED
VANCOMYCIN RESISTANCE: NOT DETECTED

## 2015-09-19 LAB — COMPREHENSIVE METABOLIC PANEL
ALBUMIN: 2.6 g/dL — AB (ref 3.5–5.0)
ALK PHOS: 50 U/L (ref 38–126)
ALT: 37 U/L (ref 14–54)
ANION GAP: 7 (ref 5–15)
AST: 53 U/L — ABNORMAL HIGH (ref 15–41)
BILIRUBIN TOTAL: 0.8 mg/dL (ref 0.3–1.2)
BUN: 16 mg/dL (ref 6–20)
CALCIUM: 8.3 mg/dL — AB (ref 8.9–10.3)
CO2: 17 mmol/L — AB (ref 22–32)
CREATININE: 1.06 mg/dL — AB (ref 0.44–1.00)
Chloride: 118 mmol/L — ABNORMAL HIGH (ref 101–111)
GFR calc Af Amer: 52 mL/min — ABNORMAL LOW (ref >60)
GFR calc non Af Amer: 45 mL/min — ABNORMAL LOW (ref >60)
GLUCOSE: 112 mg/dL — AB (ref 65–99)
Potassium: 3 mmol/L — ABNORMAL LOW (ref 3.5–5.1)
SODIUM: 142 mmol/L (ref 135–145)
TOTAL PROTEIN: 4.8 g/dL — AB (ref 6.5–8.1)

## 2015-09-19 LAB — LACTIC ACID, PLASMA
LACTIC ACID, VENOUS: 3 mmol/L — AB (ref 0.5–2.0)
LACTIC ACID, VENOUS: 2.7 mmol/L — AB (ref 0.5–2.0)
LACTIC ACID, VENOUS: 3.4 mmol/L — AB (ref 0.5–2.0)

## 2015-09-19 LAB — TROPONIN I
Troponin I: 0.43 ng/mL — ABNORMAL HIGH (ref <0.031)
Troponin I: 0.48 ng/mL — ABNORMAL HIGH (ref <0.031)
Troponin I: 0.5 ng/mL (ref <0.031)

## 2015-09-19 LAB — PHOSPHORUS: PHOSPHORUS: 2 mg/dL — AB (ref 2.5–4.6)

## 2015-09-19 LAB — PROTIME-INR
INR: 3.23 — AB (ref 0.00–1.49)
PROTHROMBIN TIME: 32.3 s — AB (ref 11.6–15.2)

## 2015-09-19 LAB — CBC
HCT: 34.5 % — ABNORMAL LOW (ref 36.0–46.0)
Hemoglobin: 11.5 g/dL — ABNORMAL LOW (ref 12.0–15.0)
MCH: 30.4 pg (ref 26.0–34.0)
MCHC: 33.3 g/dL (ref 30.0–36.0)
MCV: 91.3 fL (ref 78.0–100.0)
PLATELETS: 111 10*3/uL — AB (ref 150–400)
RBC: 3.78 MIL/uL — ABNORMAL LOW (ref 3.87–5.11)
RDW: 15 % (ref 11.5–15.5)
WBC: 16.6 10*3/uL — ABNORMAL HIGH (ref 4.0–10.5)

## 2015-09-19 LAB — MAGNESIUM
MAGNESIUM: 1.3 mg/dL — AB (ref 1.7–2.4)
Magnesium: 1.4 mg/dL — ABNORMAL LOW (ref 1.7–2.4)

## 2015-09-19 LAB — MRSA PCR SCREENING: MRSA BY PCR: NEGATIVE

## 2015-09-19 LAB — GLUCOSE, CAPILLARY: GLUCOSE-CAPILLARY: 103 mg/dL — AB (ref 65–99)

## 2015-09-19 LAB — I-STAT CG4 LACTIC ACID, ED: Lactic Acid, Venous: 7.44 mmol/L (ref 0.5–2.0)

## 2015-09-19 LAB — PROCALCITONIN: PROCALCITONIN: 39.41 ng/mL

## 2015-09-19 LAB — APTT: aPTT: 51 seconds — ABNORMAL HIGH (ref 24–37)

## 2015-09-19 MED ORDER — CHLORHEXIDINE GLUCONATE 0.12 % MT SOLN
15.0000 mL | Freq: Two times a day (BID) | OROMUCOSAL | Status: DC
  Administered 2015-09-19: 15 mL via OROMUCOSAL

## 2015-09-19 MED ORDER — SODIUM CHLORIDE 0.9 % IV BOLUS (SEPSIS)
1000.0000 mL | Freq: Once | INTRAVENOUS | Status: AC
  Administered 2015-09-19: 1000 mL via INTRAVENOUS

## 2015-09-19 MED ORDER — POTASSIUM CHLORIDE 10 MEQ/100ML IV SOLN
10.0000 meq | Freq: Once | INTRAVENOUS | Status: AC
  Administered 2015-09-19: 10 meq via INTRAVENOUS

## 2015-09-19 MED ORDER — CETYLPYRIDINIUM CHLORIDE 0.05 % MT LIQD
7.0000 mL | Freq: Two times a day (BID) | OROMUCOSAL | Status: DC
  Administered 2015-09-20 – 2015-09-24 (×9): 7 mL via OROMUCOSAL

## 2015-09-19 MED ORDER — ENOXAPARIN SODIUM 40 MG/0.4ML ~~LOC~~ SOLN: 40.0000 mg | SUBCUTANEOUS | Status: DC

## 2015-09-19 MED ORDER — LORAZEPAM 2 MG/ML IJ SOLN
0.5000 mg | Freq: Four times a day (QID) | INTRAMUSCULAR | Status: DC | PRN
  Administered 2015-09-19: 0.5 mg via INTRAVENOUS
  Filled 2015-09-19: qty 1

## 2015-09-19 MED ORDER — ONDANSETRON HCL 4 MG PO TABS: 4.0000 mg | ORAL_TABLET | Freq: Four times a day (QID) | ORAL | Status: DC | PRN

## 2015-09-19 MED ORDER — LEVOTHYROXINE SODIUM 125 MCG PO TABS
125.0000 ug | ORAL_TABLET | Freq: Every day | ORAL | Status: DC
  Administered 2015-09-20 – 2015-09-24 (×5): 125 ug via ORAL
  Filled 2015-09-19 (×6): qty 1

## 2015-09-19 MED ORDER — MAGNESIUM SULFATE 2 GM/50ML IV SOLN
2.0000 g | Freq: Once | INTRAVENOUS | Status: AC
  Administered 2015-09-19: 2 g via INTRAVENOUS
  Filled 2015-09-19: qty 50

## 2015-09-19 MED ORDER — SODIUM CHLORIDE 0.9% FLUSH
3.0000 mL | Freq: Two times a day (BID) | INTRAVENOUS | Status: DC
  Administered 2015-09-19 – 2015-09-23 (×8): 3 mL via INTRAVENOUS

## 2015-09-19 MED ORDER — WARFARIN - PHARMACIST DOSING INPATIENT: Freq: Every day | Status: DC

## 2015-09-19 MED ORDER — FUROSEMIDE 10 MG/ML IJ SOLN
40.0000 mg | Freq: Once | INTRAMUSCULAR | Status: AC
Start: 2015-09-19 — End: 2015-09-19
  Administered 2015-09-19: 40 mg via INTRAVENOUS
  Filled 2015-09-19: qty 4

## 2015-09-19 MED ORDER — ONDANSETRON HCL 4 MG/2ML IJ SOLN: 4.0000 mg | Freq: Four times a day (QID) | INTRAMUSCULAR | Status: DC | PRN

## 2015-09-19 MED ORDER — METOPROLOL TARTRATE 5 MG/5ML IV SOLN
2.5000 mg | INTRAVENOUS | Status: DC | PRN
  Filled 2015-09-19: qty 5

## 2015-09-19 MED ORDER — ACETAMINOPHEN 325 MG PO TABS
650.0000 mg | ORAL_TABLET | Freq: Four times a day (QID) | ORAL | Status: DC | PRN
  Administered 2015-09-19 – 2015-09-20 (×2): 650 mg via ORAL
  Filled 2015-09-19 (×2): qty 2

## 2015-09-19 MED ORDER — POTASSIUM CHLORIDE 10 MEQ/100ML IV SOLN
10.0000 meq | INTRAVENOUS | Status: AC
  Administered 2015-09-19 (×3): 10 meq via INTRAVENOUS
  Filled 2015-09-19 (×4): qty 100

## 2015-09-19 MED ORDER — ACETAMINOPHEN 325 MG PO TABS
650.0000 mg | ORAL_TABLET | Freq: Once | ORAL | Status: AC
  Administered 2015-09-19: 650 mg via ORAL
  Filled 2015-09-19: qty 2

## 2015-09-19 MED ORDER — LEVOTHYROXINE SODIUM 100 MCG IV SOLR
75.0000 ug | Freq: Every day | INTRAVENOUS | Status: DC
  Administered 2015-09-19: 75 ug via INTRAVENOUS
  Filled 2015-09-19: qty 5

## 2015-09-19 MED ORDER — SODIUM CHLORIDE 0.9 % IV SOLN
INTRAVENOUS | Status: DC
  Administered 2015-09-19: 05:00:00 via INTRAVENOUS

## 2015-09-19 MED ORDER — PANTOPRAZOLE SODIUM 40 MG PO TBEC
40.0000 mg | DELAYED_RELEASE_TABLET | Freq: Every day | ORAL | Status: DC
  Administered 2015-09-21 – 2015-09-24 (×4): 40 mg via ORAL
  Filled 2015-09-19 (×4): qty 1

## 2015-09-19 MED ORDER — CETYLPYRIDINIUM CHLORIDE 0.05 % MT LIQD: 7.0000 mL | Freq: Two times a day (BID) | OROMUCOSAL | Status: DC

## 2015-09-19 MED ORDER — FAMOTIDINE IN NACL 20-0.9 MG/50ML-% IV SOLN
20.0000 mg | Freq: Two times a day (BID) | INTRAVENOUS | Status: DC
  Administered 2015-09-19 (×2): 20 mg via INTRAVENOUS
  Filled 2015-09-19 (×2): qty 50

## 2015-09-19 MED ORDER — PHENYLEPHRINE HCL 10 MG/ML IJ SOLN
30.0000 ug/min | INTRAVENOUS | Status: DC
  Administered 2015-09-19: 70 ug/min via INTRAVENOUS
  Administered 2015-09-19 (×2): 30 ug/min via INTRAVENOUS
  Administered 2015-09-19: 40 ug/min via INTRAVENOUS
  Filled 2015-09-19 (×2): qty 1

## 2015-09-19 MED ORDER — SODIUM CHLORIDE 0.9 % IV BOLUS (SEPSIS)
1500.0000 mL | Freq: Once | INTRAVENOUS | Status: AC
  Administered 2015-09-19: 1500 mL via INTRAVENOUS

## 2015-09-19 MED ORDER — ACETAMINOPHEN 650 MG RE SUPP
650.0000 mg | Freq: Four times a day (QID) | RECTAL | Status: DC | PRN
  Administered 2015-09-19: 650 mg via RECTAL
  Filled 2015-09-19: qty 1

## 2015-09-19 NOTE — Progress Notes (Signed)
OT Cancellation Note  Patient Details Name: Shannon [name] MRN: [id number] DOB: July 23, 1924   Cancelled Treatment:    Reason Eval/Treat Not Completed: Other (comment);OT screened, no needs identified, will sign off.  Pt is a LTC pt at SNF. Will sign off in acute setting.  Marcellius Montagna 09/19/2015, 2:07 PM  Lesle Chris, OTR/L [phone] 09/19/2015

## 2015-09-19 NOTE — Progress Notes (Signed)
PROGRESS NOTE    Shannon [name]  [id number] DOB: [birth year] DOA: 09/18/2015 PCP: [name], MD  Brief Narrative: Shannon [name] is a 82 y.o. female from SNF with medical history significant of atrial fibrillation on Coumadin, hypertension, hyperlipidemia, GERD, hypothyroidism, depression, left breast cancer 2011 (s/p of XRT and surgery per her son), who presented with fever, nausea, vomiting and altered mental status. ED Course: pt was found to have positive urinalysis for UTI, lactate 2.07-->7.4, WBC 11.4, INR 2.63, temperature 103.5, tachycardia, tachypnea, oxygen saturation 94%, potassium 3.4, creatinine 1.01. Chest x-ray showed pulmonary vascular congestion, but no infiltration. Initially bp was 103/63, but dropped to 88/46 later. Started on pressors  Assessment & Plan:  Septic shock due to UTI  -s/p 4.5L NS and then started on Neo synephrine at 2am by Dr.Ramaswamy -MAP improved and just weaned off pressors now -Continue IV Vanco and cefepime  -follow up Bx and Ux -lactate trending down 2->7>3>2.7, pro-calcitonin was 39.4 -Now with some wheezing noted with stop IV fluids since blood pressure improved and check CXR  Acute encephalopathy: Most likely due to UTI and sepsis -has underlying dementia too, improving  Hypothyroidism: TSH was 1.110on 12/06/09. -resume PO synthroid  Atrial Fibrillation: CHA2DS2-VASc Score is 4, needs oral anticoagulation. Patient is on Coumadin. INR is 2.63 on admission. Heart rate is ~110 -Hold Cardizem today, resume coumadin  HTN: has hypotension -Hold lasix due to sepsis  GERD: -PPI  Hypokalemia: Potassium is 3.4. -Repleted  HLD: Last LDL was 73 on 12/07/15  DVT ppx: warfarin Code Status: DNR Family Communication: No family at bed side Disposition Plan: Keep in SDU, DC to SNF when stable   Consultants:   PCCM   Procedures:   Antimicrobials:  Subjective: " dont feel too good"  Objective: Filed Vitals:   09/19/15 0845  09/19/15 0915 09/19/15 0930 09/19/15 1000  BP: 117/67 111/63 105/55 124/52  Pulse: 102 102 100 105  Temp:      TempSrc:      Resp: 36 26 27 22   Height:      Weight:      SpO2: 95% 95% 96% 94%    Intake/Output Summary (Last 24 hours) at 09/19/15 1123 Last data filed at 09/19/15 0800  Gross per 24 hour  Intake 4383.13 ml  Output     50 ml  Net 4333.13 ml   Filed Weights   09/18/15 2125 09/19/15 0100  Weight: 63.504 kg (140 lb) 67.9 kg (149 lb 11.1 oz)    Examination:  General exam: Appears calm and comfortable  Respiratory system: Clear to auscultation. Respiratory effort normal. Cardiovascular system: S1 & S2 heard, RRR. No JVD, murmurs, rubs, gallops or clicks. No pedal edema. Gastrointestinal system: Abdomen is nondistended, soft and nontender. No organomegaly or masses felt. Normal bowel sounds heard. Central nervous system: Alert and oriented. No focal neurological deficits. Extremities: Symmetric 5 x 5 power. Skin: No rashes, lesions or ulcers Psychiatry: Judgement and insight appear normal. Mood & affect appropriate.     Data Reviewed: I have personally reviewed following labs and imaging studies  CBC:  Recent Labs Lab 09/18/15 2141 09/18/15 2202 09/19/15 0619  WBC 11.4*  --  16.6*  NEUTROABS 10.8*  --   --   HGB 12.8 14.3 11.5*  HCT 38.4 42.0 34.5*  MCV 91.2  --  91.3  PLT 160  --  99991111*   Basic Metabolic Panel:  Recent Labs Lab 09/18/15 2141 09/18/15 2202 09/19/15 0321 09/19/15 0619  NA 139 142  --  142  K 3.4* 3.4*  --  3.0*  CL 110 107  --  118*  CO2 22  --   --  17*  GLUCOSE 112* 107*  --  112*  BUN 19 18  --  16  CREATININE 1.01* 1.00  --  1.06*  CALCIUM 9.8  --   --  8.3*  MG  --   --  1.3* 1.4*  PHOS  --   --   --  2.0*   GFR: Estimated Creatinine Clearance: 34.1 mL/min (by C-G formula based on Cr of 1.06). Liver Function Tests:  Recent Labs Lab 09/18/15 2141 09/19/15 0619  AST 78* 53*  ALT 49 37  ALKPHOS 79 50  BILITOT 0.9  0.8  PROT 5.8* 4.8*  ALBUMIN 3.4* 2.6*   No results for input(s): LIPASE, AMYLASE in the last 168 hours. No results for input(s): AMMONIA in the last 168 hours. Coagulation Profile:  Recent Labs Lab 09/18/15 2141 09/19/15 0619  INR 2.63* 3.23*   Cardiac Enzymes:  Recent Labs Lab 09/19/15 0321  TROPONINI 0.43*   BNP (last 3 results) No results for input(s): PROBNP in the last 8760 hours. HbA1C: No results for input(s): HGBA1C in the last 72 hours. CBG:  Recent Labs Lab 09/19/15 0747  GLUCAP 103*   Lipid Profile: No results for input(s): CHOL, HDL, LDLCALC, TRIG, CHOLHDL, LDLDIRECT in the last 72 hours. Thyroid Function Tests: No results for input(s): TSH, T4TOTAL, FREET4, T3FREE, THYROIDAB in the last 72 hours. Anemia Panel: No results for input(s): VITAMINB12, FOLATE, FERRITIN, TIBC, IRON, RETICCTPCT in the last 72 hours. Urine analysis:    Component Value Date/Time   COLORURINE YELLOW 09/18/2015 2202   APPEARANCEUR CLOUDY* 09/18/2015 2202   LABSPEC 1.017 09/18/2015 2202   PHURINE 7.0 09/18/2015 2202   GLUCOSEU NEGATIVE 09/18/2015 2202   HGBUR MODERATE* 09/18/2015 2202   BILIRUBINUR NEGATIVE 09/18/2015 2202   KETONESUR NEGATIVE 09/18/2015 2202   PROTEINUR NEGATIVE 09/18/2015 2202   UROBILINOGEN 0.2 09/23/2014 2201   NITRITE POSITIVE* 09/18/2015 2202   LEUKOCYTESUR MODERATE* 09/18/2015 2202   Sepsis Labs: @LABRCNTIP (procalcitonin:4,lacticidven:4)  ) Recent Results (from the past 240 hour(s))  Culture, blood (routine x 2)     Status: None (Preliminary result)   Collection Time: 09/18/15  9:41 PM  Result Value Ref Range Status   Specimen Description BLOOD RIGHT HAND  Final   Special Requests BOTTLES DRAWN AEROBIC AND ANAEROBIC 5CC  Final   Culture  Setup Time   Final    GRAM NEGATIVE RODS IN BOTH AEROBIC AND ANAEROBIC BOTTLES Organism ID to follow Performed at [place] [place] [facility]  Final   Report Status PENDING   Incomplete  Culture, blood (routine x 2)     Status: None (Preliminary result)   Collection Time: 09/18/15  9:41 PM  Result Value Ref Range Status   Specimen Description BLOOD RIGHT ANTECUBITAL  Final   Special Requests BOTTLES DRAWN AEROBIC AND ANAEROBIC 10CC  Final   Culture  Setup Time   Final    GRAM NEGATIVE RODS IN BOTH AEROBIC AND ANAEROBIC BOTTLES Performed at [place]  Final   Report Status PENDING  Incomplete  Blood Culture ID Panel (Reflexed)     Status: Abnormal   Collection Time: 09/18/15  9:41 PM  Result Value Ref Range Status   Enterococcus species NOT DETECTED NOT DETECTED Final   Vancomycin resistance NOT DETECTED NOT DETECTED Final  Listeria monocytogenes NOT DETECTED NOT DETECTED Final   Staphylococcus species NOT DETECTED NOT DETECTED Final   Staphylococcus aureus NOT DETECTED NOT DETECTED Final   Methicillin resistance NOT DETECTED NOT DETECTED Final   Streptococcus species NOT DETECTED NOT DETECTED Final   Streptococcus agalactiae NOT DETECTED NOT DETECTED Final   Streptococcus pneumoniae NOT DETECTED NOT DETECTED Final   Streptococcus pyogenes NOT DETECTED NOT DETECTED Final   Acinetobacter baumannii NOT DETECTED NOT DETECTED Final   Enterobacteriaceae species DETECTED (A) NOT DETECTED Final    Comment: CRITICAL RESULT CALLED TO, READ BACK BY AND VERIFIED WITH: EBurman Foster.D. 11:20 09/19/15 ([email or web address])    Enterobacter cloacae complex NOT DETECTED NOT DETECTED Final   Escherichia coli NOT DETECTED NOT DETECTED Final   Klebsiella oxytoca NOT DETECTED NOT DETECTED Final   Klebsiella pneumoniae DETECTED (A) NOT DETECTED Final    Comment: CRITICAL RESULT CALLED TO, READ BACK BY AND VERIFIED WITH: EBurman Foster.D. 11:20 09/19/15 ([email or web address])    Proteus species NOT DETECTED NOT DETECTED Final   Serratia marcescens NOT DETECTED NOT DETECTED Final   Carbapenem resistance NOT DETECTED NOT DETECTED Final   Haemophilus  influenzae NOT DETECTED NOT DETECTED Final   Neisseria meningitidis NOT DETECTED NOT DETECTED Final   Pseudomonas aeruginosa NOT DETECTED NOT DETECTED Final   Candida albicans NOT DETECTED NOT DETECTED Final   Candida glabrata NOT DETECTED NOT DETECTED Final   Candida krusei NOT DETECTED NOT DETECTED Final   Candida parapsilosis NOT DETECTED NOT DETECTED Final   Candida tropicalis NOT DETECTED NOT DETECTED Final  MRSA PCR Screening     Status: None   Collection Time: 09/19/15  4:59 AM  Result Value Ref Range Status   MRSA by PCR NEGATIVE NEGATIVE Final    Comment:        The GeneXpert MRSA Assay (FDA approved for NASAL specimens only), is one component of a comprehensive MRSA colonization surveillance program. It is not intended to diagnose MRSA infection nor to guide or monitor treatment for MRSA infections.          Radiology Studies: Dg Chest Port 1 View  09/19/2015  CLINICAL DATA:  Hypoxia and wheezing today.  Initial encounter. EXAM: PORTABLE CHEST 1 VIEW COMPARISON:  Single-view of the chest 09/18/2015 and 09/23/2014. FINDINGS: There is a small left pleural effusion with basilar airspace disease. The right lung is clear. Lung volumes are somewhat low. Heart size is upper normal. No pneumothorax. IMPRESSION: Small left pleural effusion and basilar airspace disease which could be atelectasis or pneumonia. Electronically Signed   By: Inge Rise M.D.   On: 09/19/2015 10:42   Dg Chest Portable 1 View  09/18/2015  CLINICAL DATA:  Fever and weakness. EXAM: PORTABLE CHEST 1 VIEW COMPARISON:  09/23/2014 FINDINGS: Shallow inspiration. Mild cardiac enlargement. Pulmonary vascularity is mildly prominent but there is no evidence of edema or consolidation. No blunting of costophrenic angles. No pneumothorax. Calcified and tortuous aorta. Degenerative changes in the spine and shoulders IMPRESSION: Cardiac enlargement with mild pulmonary vascular congestion. No consolidation or edema.  Electronically Signed   By: Lucienne Capers M.D.   On: 09/18/2015 21:59        Scheduled Meds: . ceFEPime (MAXIPIME) IV  1 g Intravenous Q24H  . levothyroxine  125 mcg Oral QAC breakfast  . pantoprazole  40 mg Oral Q1200  . sodium chloride flush  3 mL Intravenous Q12H  . vancomycin  1,000 mg Intravenous Q24H   Continuous Infusions: . sodium chloride 100 mL/hr at  09/19/15 0436  . phenylephrine (NEO-SYNEPHRINE) Adult infusion Stopped (09/19/15 1007)     LOS: 1 day    Time spent: 60min    Domenic Polite, MD [facility] Pager [phone]  If 7PM-7AM, please contact night-coverage [email or web address] Password [facility] 09/19/2015, 11:23 AM

## 2015-09-19 NOTE — Progress Notes (Signed)
CRITICAL VALUE ALERT  Critical value received:  Lactic acid 3.0  Date of notification:  09/19/2015  Time of notification:  0650  Critical value read back:Yes.    Nurse who received alert:  Reche Dixon  MD notified (1st page):  Raliegh Ip Baltazar Najjar  Time of first page:  [phone]  MD notified (2nd page):  Time of second page:  Responding MD: Tylene Fantasia  Time MD responded:  0700

## 2015-09-19 NOTE — Progress Notes (Signed)
CRITICAL VALUE ALERT  Critical value received:  Troponin 0.50  Date of notification:  09/19/2015  Time of notification:  2053  Critical value read back:Yes.    Nurse who received alert:  Reche Dixon  MD notified (1st page):  Raliegh Ip Baltazar Najjar  Time of first page:  2100  MD notified (2nd page):  Time of second page:  Responding MD:  Tylene Fantasia   Time MD responded:  2105

## 2015-09-19 NOTE — ED Notes (Signed)
Hospitalist at bedside to evaluate pt. Family at bedside.

## 2015-09-19 NOTE — Progress Notes (Signed)
PT Cancellation Note  Patient Details Name: Shannon [name] MRN: [id number] DOB: 02/01/1925   Cancelled Treatment:    Reason Eval/Treat Not Completed: Patient not medically ready (per RN. Patient is from SNF.)   Claretha Cooper 09/19/2015, 1:49 PM Tresa Endo PT [phone]

## 2015-09-19 NOTE — Progress Notes (Signed)
ANTICOAGULATION CONSULT NOTE - Initial Consult  Pharmacy Consult for Warfarin Indication: atrial fibrillation  Allergies  Allergen Reactions  . Demerol Nausea And Vomiting  . Keflet [Cephalexin] Nausea And Vomiting  . Sulfa Antibiotics Nausea And Vomiting  . Tequin Nausea Only  . Penicillins Rash    Patient Measurements: Height: 5\' 4"  (162.6 cm) Weight: 149 lb 11.1 oz (67.9 kg) IBW/kg (Calculated) : 54.7  Vital Signs: Temp: 98.1 F (36.7 C) (05/17 0535) Temp Source: Axillary (05/17 0535) BP: 124/52 mmHg (05/17 1000) Pulse Rate: 105 (05/17 1000)  Labs:  Recent Labs  09/18/15 2141 09/18/15 2202 09/19/15 0321 09/19/15 0619  HGB 12.8 14.3  --  11.5*  HCT 38.4 42.0  --  34.5*  PLT 160  --   --  111*  APTT  --   --   --  51*  LABPROT 27.7*  --   --  32.3*  INR 2.63*  --   --  3.23*  CREATININE 1.01* 1.00  --  1.06*  TROPONINI  --   --  0.43*  --     Estimated Creatinine Clearance: 34.1 mL/min (by C-G formula based on Cr of 1.06).   Medical History: Past Medical History  Diagnosis Date  . GERD (gastroesophageal reflux disease)   . Hypertension   . Hypothyroidism   . Cancer [facility])     left breast  . Hx of radiation therapy 2001    left breast  . A-fib ([place])   . Constipation   . HLD (hyperlipidemia)   . GERD (gastroesophageal reflux disease)     Assessment: 19 yoF on warfarin PTA for atrial fibrillation, admitted with septic shock.  Warfarin initially held on admission due to AMS.  Pharmacy consulted to resume today.  PTA warfarin dose documented as: 2.5 mg daily except takes 5 mg on MWF, last dose 5/16 1600 and INR was therapeutic on admission at 2.63.  Today, 09/19/2015: - INR supratherapeutic at 3.23 - Hgb 11.5, plts 111K - No bleeding reported.  - holding PTA cardizem for rate control due to septic shock, requiring pressor support  Goal of Therapy:  INR 2-3   Plan:  Hold warfarin today due to elevated INR. Daily INR.  Hershal Coria 09/19/2015,11:53 AM

## 2015-09-19 NOTE — Care Management Note (Signed)
Case Management Note  Patient Details  Name: Shannon [name] MRN: [id number] Date of Birth: 05-Jul-1924  Subjective/Objective:       sepsis             Action/Plan:Date:  Sep 19, 2015 Chart reviewed for concurrent status and case management needs. Will continue to follow patient for changes and needs: Expected discharge date: [id number] Velva Harman, BSN, [place], [place]   Expected Discharge Date:   (UNKNOWN)               Expected Discharge Plan:  [place]  In-House Referral:  Clinical Social Work  Discharge planning Services  CM Consult  Post Acute Care Choice:  NA Choice offered to:  NA  DME Arranged:  N/A DME Agency:  NA  HH Arranged:  NA HH Agency:  NA  Status of Service:  In process, will continue to follow  Medicare Important Message Given:    Date Medicare IM Given:    Medicare IM give by:    Date Additional Medicare IM Given:    Additional Medicare Important Message give by:     If discussed at [place] of Stay Meetings, dates discussed:    Additional Comments:  Leeroy Cha, RN 09/19/2015, 10:08 AM

## 2015-09-19 NOTE — Progress Notes (Signed)
eLink Physician-Brief Progress Note Patient Name: Shannon [name] DOB: [birth year] MRN: [id number]   Date of Service  09/19/2015  HPI/Events of Note  dnr patient with septic shock. This marks an eval done at 2.10am when epic was done. Noted bas hypotnsive with rising lactate  eICU Interventions  2l bolus and neo via PI V ordered at  2.10am   Dx    ICD-9-CM ICD-10-CM   1. Lower urinary tract infection 599.0 N39.0   2. HLD (hyperlipidemia) 272.4 E78.5   3. Gastroesophageal reflux disease without esophagitis 530.81 K21.9      Intervention Category Major Interventions: Hypovolemia - evaluation and treatment with fluids;Shock - evaluation and management  Baila Rouse 09/19/2015, 3:46 AM    PULMONARY  Recent Labs Lab 09/18/15 2202  TCO2 20    CBC  Recent Labs Lab 09/18/15 2141 09/18/15 2202  HGB 12.8 14.3  HCT 38.4 42.0  WBC 11.4*  --   PLT 160  --     COAGULATION  Recent Labs Lab 09/18/15 2141  INR 2.63*    CARDIAC  No results for input(s): TROPONINI in the last 168 hours. No results for input(s): PROBNP in the last 168 hours.   CHEMISTRY  Recent Labs Lab 09/18/15 2141 09/18/15 2202  NA 139 142  K 3.4* 3.4*  CL 110 107  CO2 22  --   GLUCOSE 112* 107*  BUN 19 18  CREATININE 1.01* 1.00  CALCIUM 9.8  --    Estimated Creatinine Clearance: 36.1 mL/min (by C-G formula based on Cr of 1).   LIVER  Recent Labs Lab 09/18/15 2141  AST 78*  ALT 49  ALKPHOS 79  BILITOT 0.9  PROT 5.8*  ALBUMIN 3.4*  INR 2.63*     INFECTIOUS  Recent Labs Lab 09/18/15 2203 09/19/15 0053  LATICACIDVEN 2.07* 7.44*     ENDOCRINE CBG (last 3)  No results for input(s): GLUCAP in the last 72 hours.       IMAGING x48h  - image(s) personally visualized  -   highlighted in bold Dg Chest Portable 1 View  09/18/2015  CLINICAL DATA:  Fever and weakness. EXAM: PORTABLE CHEST 1 VIEW COMPARISON:  09/23/2014 FINDINGS: Shallow inspiration. Mild cardiac  enlargement. Pulmonary vascularity is mildly prominent but there is no evidence of edema or consolidation. No blunting of costophrenic angles. No pneumothorax. Calcified and tortuous aorta. Degenerative changes in the spine and shoulders IMPRESSION: Cardiac enlargement with mild pulmonary vascular congestion. No consolidation or edema. Electronically Signed   By: Lucienne Capers M.D.   On: 09/18/2015 21:59

## 2015-09-19 NOTE — ED Notes (Signed)
Dr. Alvino Chapel notified of temp and order given for 650mg  Tylenol PO.

## 2015-09-19 NOTE — Progress Notes (Addendum)
PHARMACY - PHYSICIAN COMMUNICATION CRITICAL VALUE ALERT - BLOOD CULTURE IDENTIFICATION (BCID)  BCID result received:  [ ]  Vancomycin resistant enterococcus (VRE) [ ]  Enterococcus spp (no resistance detected) [ ]  Listeria monocytogenes [ ]  Staphylococcus species (methicillin resistance detected) [ ]  Staphylococcus species (methicllin resistance NOT detected) [ ]  Methicillin-resistant Staphylococcus aureus (MRSA) [ ]  Methicillin-susceptible Staphylococcus aureus (MSSA) [ ]  Streptococcus agalactiae (Group B strep) [ ]  Streptococcus pneumoniae [ ]  Streptococcus pyogenes (Group A strep) [ ]  Streptococcus spp.  [ ]  Acinetobacter baumannii [ ]  Enterobacter cloacae [ ]  Enterobacter cloacae (Carbapenem resistance detected) [ ]  Escherichia coli [ ]  Escherichia coli (Carbapenem resistance detected) [ ]  Haemophilus influenza [ ]  Klebsiella oxytoca [ ]  Klebsiella oxytoca (Carbapenem resistance detected) [X]  Klebsiella pneumoniae [ ]  Klebsiella pneumoniae (Carbapenem resistance detected) [ ]  Neisseria meningitidis [ ]  Proteus spp. [ ]  Proteus spp. (Carbapenem resistance detected) [ ]  Pseudomonas aeruginosa [ ]  Pseudomonas aeruginosa (Carbapenem resistance detected) [ ]  Serratia marcescens [ ]  Candida _______ albicans, glabrata, krusei, parapsilosis, tropicalis (fill in blank with correct species)  Name of physician (or Provider) Contacted: Text paged Dr. Broadus John with result and recommendation to switch antibiotics to Ceftriaxone 2g IV q24h if able to narrow.  Patient currently on Cefepime and Vancomycin (started last night) for septic shock.  LA, WBC remaining elevated this morning, remains on phenylephrine infusion.  Dr. Broadus John plans on narrowing tomorrow.  Hershal Coria 09/19/2015 11:32 AM

## 2015-09-19 NOTE — Progress Notes (Signed)
Nutrition Brief Note  Patient identified on the Malnutrition Screening Tool (MST) Report  Wt Readings from Last 15 Encounters:  09/19/15 149 lb 11.1 oz (67.9 kg)  09/23/14 150 lb (68.04 kg)  11/29/12 152 lb 9.6 oz (69.219 kg)  12/01/11 149 lb 12.8 oz (67.949 kg)  07/01/11 159 lb 9.8 oz (72.4 kg)    Body mass index is 25.68 kg/(m^2). Patient meets criteria for overweight based on current BMI.   Current diet order is 2 gram Na, no intakes documented at this time at diet advanced from NPO status following breakfast this AM. No muscle or fat wasting noted. Pt denies chewing or swallowing issues and states she had a good appetite PTA. Skin WDL. Labs and medications reviewed.   No nutrition interventions warranted at this time. If nutrition issues arise, please consult RD.     Jarome Matin, RD, LDN Inpatient Clinical Dietitian Pager # [phone] After hours/weekend pager # [phone]

## 2015-09-20 ENCOUNTER — Inpatient Hospital Stay (HOSPITAL_COMMUNITY): Payer: Medicare Other

## 2015-09-20 LAB — PROTIME-INR
INR: 4.2 — AB (ref 0.00–1.49)
PROTHROMBIN TIME: 39.4 s — AB (ref 11.6–15.2)

## 2015-09-20 LAB — CBC
HEMATOCRIT: 35.9 % — AB (ref 36.0–46.0)
Hemoglobin: 11.9 g/dL — ABNORMAL LOW (ref 12.0–15.0)
MCH: 30.3 pg (ref 26.0–34.0)
MCHC: 33.1 g/dL (ref 30.0–36.0)
MCV: 91.3 fL (ref 78.0–100.0)
Platelets: 81 10*3/uL — ABNORMAL LOW (ref 150–400)
RBC: 3.93 MIL/uL (ref 3.87–5.11)
RDW: 15.5 % (ref 11.5–15.5)
WBC: 23.1 10*3/uL — ABNORMAL HIGH (ref 4.0–10.5)

## 2015-09-20 LAB — ECHOCARDIOGRAM COMPLETE
HEIGHTINCHES: 64 in
WEIGHTICAEL: 2395.08 [oz_av]

## 2015-09-20 LAB — BASIC METABOLIC PANEL
Anion gap: 6 (ref 5–15)
BUN: 16 mg/dL (ref 6–20)
CHLORIDE: 115 mmol/L — AB (ref 101–111)
CO2: 20 mmol/L — AB (ref 22–32)
Calcium: 8.9 mg/dL (ref 8.9–10.3)
Creatinine, Ser: 0.73 mg/dL (ref 0.44–1.00)
GFR calc non Af Amer: >60 mL/min (ref >60)
Glucose, Bld: 92 mg/dL (ref 65–99)
POTASSIUM: 3.2 mmol/L — AB (ref 3.5–5.1)
SODIUM: 141 mmol/L (ref 135–145)

## 2015-09-20 LAB — PHOSPHORUS: Phosphorus: 2.9 mg/dL (ref 2.5–4.6)

## 2015-09-20 LAB — GLUCOSE, CAPILLARY: GLUCOSE-CAPILLARY: 82 mg/dL (ref 65–99)

## 2015-09-20 LAB — MAGNESIUM: MAGNESIUM: 2 mg/dL (ref 1.7–2.4)

## 2015-09-20 MED ORDER — DILTIAZEM HCL 100 MG IV SOLR
INTRAVENOUS | Status: AC
  Administered 2015-09-20: 5 mg/h via INTRAVENOUS
  Filled 2015-09-20: qty 100

## 2015-09-20 MED ORDER — DIGOXIN 0.25 MG/ML IJ SOLN
0.2500 mg | Freq: Four times a day (QID) | INTRAMUSCULAR | Status: AC
  Administered 2015-09-20 (×2): 0.25 mg via INTRAVENOUS
  Filled 2015-09-20 (×4): qty 1

## 2015-09-20 MED ORDER — FUROSEMIDE 10 MG/ML IJ SOLN
40.0000 mg | Freq: Four times a day (QID) | INTRAMUSCULAR | Status: DC
  Administered 2015-09-20: 40 mg via INTRAVENOUS
  Filled 2015-09-20 (×2): qty 4

## 2015-09-20 MED ORDER — HYDRALAZINE HCL 20 MG/ML IJ SOLN
10.0000 mg | Freq: Once | INTRAMUSCULAR | Status: AC
  Administered 2015-09-20: 10 mg via INTRAVENOUS
  Filled 2015-09-20: qty 1

## 2015-09-20 MED ORDER — POTASSIUM CHLORIDE CRYS ER 20 MEQ PO TBCR
40.0000 meq | EXTENDED_RELEASE_TABLET | Freq: Once | ORAL | Status: AC
  Administered 2015-09-20: 40 meq via ORAL
  Filled 2015-09-20: qty 2

## 2015-09-20 MED ORDER — DILTIAZEM LOAD VIA INFUSION
10.0000 mg | Freq: Once | INTRAVENOUS | Status: AC
  Administered 2015-09-20: 10 mg via INTRAVENOUS
  Filled 2015-09-20: qty 10

## 2015-09-20 MED ORDER — DEXTROSE 5 % IV SOLN
5.0000 mg/h | INTRAVENOUS | Status: DC
  Administered 2015-09-20: 15 mg/h via INTRAVENOUS
  Administered 2015-09-20: 5 mg/h via INTRAVENOUS
  Administered 2015-09-21: 15 mg/h via INTRAVENOUS
  Filled 2015-09-20 (×3): qty 100

## 2015-09-20 MED ORDER — AMLODIPINE BESYLATE 10 MG PO TABS
10.0000 mg | ORAL_TABLET | Freq: Every day | ORAL | Status: DC
  Administered 2015-09-20: 10 mg via ORAL
  Filled 2015-09-20: qty 1

## 2015-09-20 MED ORDER — METOPROLOL TARTRATE 5 MG/5ML IV SOLN: 5.0000 mg | INTRAVENOUS | Status: DC | PRN

## 2015-09-20 MED ORDER — DEXTROSE 5 % IV SOLN
2.0000 g | INTRAVENOUS | Status: DC
  Administered 2015-09-20 – 2015-09-23 (×4): 2 g via INTRAVENOUS
  Filled 2015-09-20 (×4): qty 2

## 2015-09-20 MED ORDER — HYDRALAZINE HCL 20 MG/ML IJ SOLN
5.0000 mg | Freq: Once | INTRAMUSCULAR | Status: AC
  Administered 2015-09-20: 5 mg via INTRAVENOUS
  Filled 2015-09-20: qty 1

## 2015-09-20 MED ORDER — DILTIAZEM HCL ER COATED BEADS 180 MG PO CP24
180.0000 mg | ORAL_CAPSULE | Freq: Every day | ORAL | Status: DC
  Administered 2015-09-20: 180 mg via ORAL
  Filled 2015-09-20: qty 1

## 2015-09-20 NOTE — Progress Notes (Signed)
MD notified of patients HR. Despite being on 15 of Cardizem patient still remains with HR 150's afib rvr. Blood pressure is soft. Current pressure 94/63.

## 2015-09-20 NOTE — Progress Notes (Signed)
PROGRESS NOTE    Shannon [name]  [id number] DOB: [birth year] DOA: 09/18/2015 PCP: [name], MD  Brief Narrative: Shannon [name] is a 82 y.o. female from SNF with medical history significant of atrial fibrillation on Coumadin, hypertension, hyperlipidemia, GERD, hypothyroidism, depression, left breast cancer 2011 (s/p of XRT and surgery per her son), who presented with fever, nausea, vomiting and altered mental status. ED Course: pt was found to have positive urinalysis for UTI, lactate 2.07-->7.4, WBC 11.4, INR 2.63, temperature 103.5, tachycardia, tachypnea, oxygen saturation 94%, potassium 3.4, creatinine 1.01. Chest x-ray showed pulmonary vascular congestion, but no infiltration. Initially bp was 103/63, but dropped to 88/46 later. Started on pressors  Assessment & Plan:  Septic shock due to UTI  -s/p 4.5L NS and then started on Neo synephrine, off pressors since 11am 5/17 -Blood Cx with Klebsiella and enterobacter species on PCR -Stop Vanco and cefepime, change to IV ceftriaxone, FU sensitivities -sepsis physiology improved  Acute encephalopathy: Most likely due to UTI and sepsis -has underlying dementia too, improving  Hypothyroidism: TSH was 1.110on 12/06/09. -resumed PO synthroid  Atrial Fibrillation: CHA2DS2-VASc Score is 4, needs oral anticoagulation. Patient is on Coumadin. INR is 2.63 on admission. Heart rate is ~110 -resume Cardizem today, resume coumadin  HTN:  -resume cardizem and add amlodipine  GERD: -PPI  Hypokalemia: -Replete  HLD: Last LDL was 73 on 12/07/15  DVT ppx: warfarin  Code Status: DNR Family Communication:daughter in law at bed side Disposition Plan: Keep in SDU, DC to SNF when stable   Consultants:   PCCM   Procedures:   Antimicrobials:  Subjective: " feels better, some dyspnea"  Objective: Filed Vitals:   09/20/15 0600 09/20/15 0713 09/20/15 0800 09/20/15 1000  BP: 183/103 162/67 147/60 152/75  Pulse: 101 105 92 98    Temp:   98.3 F (36.8 C)   TempSrc:   Oral   Resp: 26 20 43 40  Height:      Weight:      SpO2: 96% 96% 97% 99%    Intake/Output Summary (Last 24 hours) at 09/20/15 1123 Last data filed at 09/19/15 2300  Gross per 24 hour  Intake    570 ml  Output      0 ml  Net    570 ml   Filed Weights   09/18/15 2125 09/19/15 0100  Weight: 63.504 kg (140 lb) 67.9 kg (149 lb 11.1 oz)    Examination:  General exam: Appears calm and comfortable  Respiratory system: crackles in both lower lungs Cardiovascular system: S1 & S2 heard, RRR. No JVD, murmurs, rubs, gallops or clicks. No pedal edema. Gastrointestinal system: Abdomen is nondistended, soft and nontender. No organomegaly or masses felt. Normal bowel sounds  Central nervous system: Alert and oriented. No focal neurological deficits. Extremities: Symmetric 5 x 5 power. Skin: No rashes, lesions or ulcers Psychiatry: Judgement and insight appear normal. Mood & affect appropriate.     Data Reviewed: I have personally reviewed following labs and imaging studies  CBC:  Recent Labs Lab 09/18/15 2141 09/18/15 2202 09/19/15 0619 09/20/15 0322  WBC 11.4*  --  16.6* 23.1*  NEUTROABS 10.8*  --   --   --   HGB 12.8 14.3 11.5* 11.9*  HCT 38.4 42.0 34.5* 35.9*  MCV 91.2  --  91.3 91.3  PLT 160  --  111* 81*   Basic Metabolic Panel:  Recent Labs Lab 09/18/15 2141 09/18/15 2202 09/19/15 0321 09/19/15 0619 09/20/15 0322  NA 139 142  --  142 141  K 3.4* 3.4*  --  3.0* 3.2*  CL 110 107  --  118* 115*  CO2 22  --   --  17* 20*  GLUCOSE 112* 107*  --  112* 92  BUN 19 18  --  16 16  CREATININE 1.01* 1.00  --  1.06* 0.73  CALCIUM 9.8  --   --  8.3* 8.9  MG  --   --  1.3* 1.4* 2.0  PHOS  --   --   --  2.0* 2.9   GFR: Estimated Creatinine Clearance: 45.2 mL/min (by C-G formula based on Cr of 0.73). Liver Function Tests:  Recent Labs Lab 09/18/15 2141 09/19/15 0619  AST 78* 53*  ALT 49 37  ALKPHOS 79 50  BILITOT 0.9 0.8   PROT 5.8* 4.8*  ALBUMIN 3.4* 2.6*   No results for input(s): LIPASE, AMYLASE in the last 168 hours. No results for input(s): AMMONIA in the last 168 hours. Coagulation Profile:  Recent Labs Lab 09/18/15 2141 09/19/15 0619 09/20/15 0322  INR 2.63* 3.23* 4.20*   Cardiac Enzymes:  Recent Labs Lab 09/19/15 0321 09/19/15 1303 09/19/15 1951  TROPONINI 0.43* 0.48* 0.50*   BNP (last 3 results) No results for input(s): PROBNP in the last 8760 hours. HbA1C: No results for input(s): HGBA1C in the last 72 hours. CBG:  Recent Labs Lab 09/19/15 0747 09/20/15 0745  GLUCAP 103* 82   Lipid Profile: No results for input(s): CHOL, HDL, LDLCALC, TRIG, CHOLHDL, LDLDIRECT in the last 72 hours. Thyroid Function Tests: No results for input(s): TSH, T4TOTAL, FREET4, T3FREE, THYROIDAB in the last 72 hours. Anemia Panel: No results for input(s): VITAMINB12, FOLATE, FERRITIN, TIBC, IRON, RETICCTPCT in the last 72 hours. Urine analysis:    Component Value Date/Time   COLORURINE YELLOW 09/18/2015 2202   APPEARANCEUR CLOUDY* 09/18/2015 2202   LABSPEC 1.017 09/18/2015 2202   PHURINE 7.0 09/18/2015 2202   GLUCOSEU NEGATIVE 09/18/2015 2202   HGBUR MODERATE* 09/18/2015 2202   BILIRUBINUR NEGATIVE 09/18/2015 2202   KETONESUR NEGATIVE 09/18/2015 2202   PROTEINUR NEGATIVE 09/18/2015 2202   UROBILINOGEN 0.2 09/23/2014 2201   NITRITE POSITIVE* 09/18/2015 2202   LEUKOCYTESUR MODERATE* 09/18/2015 2202   Sepsis Labs: @LABRCNTIP (procalcitonin:4,lacticidven:4)  ) Recent Results (from the past 240 hour(s))  Culture, blood (routine x 2)     Status: None (Preliminary result)   Collection Time: 09/18/15  9:41 PM  Result Value Ref Range Status   Specimen Description BLOOD RIGHT HAND  Final   Special Requests BOTTLES DRAWN AEROBIC AND ANAEROBIC 5CC  Final   Culture  Setup Time   Final    GRAM NEGATIVE RODS IN BOTH AEROBIC AND ANAEROBIC BOTTLES Organism ID to follow CRITICAL RESULT CALLED TO,  READ BACK BY AND VERIFIED WITH: Merrilee Seashore AT 1120 ON [id number] BY Ronnie Derby Performed at [place]  Final   Report Status PENDING  Incomplete  Culture, blood (routine x 2)     Status: None (Preliminary result)   Collection Time: 09/18/15  9:41 PM  Result Value Ref Range Status   Specimen Description BLOOD RIGHT ANTECUBITAL  Final   Special Requests BOTTLES DRAWN AEROBIC AND ANAEROBIC 10CC  Final   Culture  Setup Time   Final    GRAM NEGATIVE RODS IN BOTH AEROBIC AND ANAEROBIC BOTTLES CRITICAL RESULT CALLED TO, READ BACK BY AND VERIFIED WITH: EGlennon Mac, PHARM D AT [place] ON [id number] BY Ronnie Derby Performed at  [facility]    Culture GRAM NEGATIVE RODS  Final   Report Status PENDING  Incomplete  Blood Culture ID Panel (Reflexed)     Status: Abnormal   Collection Time: 09/18/15  9:41 PM  Result Value Ref Range Status   Enterococcus species NOT DETECTED NOT DETECTED Final   Vancomycin resistance NOT DETECTED NOT DETECTED Final   Listeria monocytogenes NOT DETECTED NOT DETECTED Final   Staphylococcus species NOT DETECTED NOT DETECTED Final   Staphylococcus aureus NOT DETECTED NOT DETECTED Final   Methicillin resistance NOT DETECTED NOT DETECTED Final   Streptococcus species NOT DETECTED NOT DETECTED Final   Streptococcus agalactiae NOT DETECTED NOT DETECTED Final   Streptococcus pneumoniae NOT DETECTED NOT DETECTED Final   Streptococcus pyogenes NOT DETECTED NOT DETECTED Final   Acinetobacter baumannii NOT DETECTED NOT DETECTED Final   Enterobacteriaceae species DETECTED (A) NOT DETECTED Final    Comment: CRITICAL RESULT CALLED TO, READ BACK BY AND VERIFIED WITH: EBurman Foster.D. 11:20 09/19/15 ([email or web address])    Enterobacter cloacae complex NOT DETECTED NOT DETECTED Final   Escherichia coli NOT DETECTED NOT DETECTED Final   Klebsiella oxytoca NOT DETECTED NOT DETECTED Final   Klebsiella pneumoniae DETECTED (A) NOT DETECTED Final     Comment: CRITICAL RESULT CALLED TO, READ BACK BY AND VERIFIED WITH: EBurman Foster.D. 11:20 09/19/15 ([email or web address])    Proteus species NOT DETECTED NOT DETECTED Final   Serratia marcescens NOT DETECTED NOT DETECTED Final   Carbapenem resistance NOT DETECTED NOT DETECTED Final   Haemophilus influenzae NOT DETECTED NOT DETECTED Final   Neisseria meningitidis NOT DETECTED NOT DETECTED Final   Pseudomonas aeruginosa NOT DETECTED NOT DETECTED Final   Candida albicans NOT DETECTED NOT DETECTED Final   Candida glabrata NOT DETECTED NOT DETECTED Final   Candida krusei NOT DETECTED NOT DETECTED Final   Candida parapsilosis NOT DETECTED NOT DETECTED Final   Candida tropicalis NOT DETECTED NOT DETECTED Final  Urine culture     Status: Abnormal (Preliminary result)   Collection Time: 09/18/15 10:02 PM  Result Value Ref Range Status   Specimen Description URINE, CATHETERIZED  Final   Special Requests NONE  Final   Culture >=100,000 COLONIES/mL GRAM NEGATIVE RODS (A)  Final   Report Status PENDING  Incomplete  MRSA PCR Screening     Status: None   Collection Time: 09/19/15  4:59 AM  Result Value Ref Range Status   MRSA by PCR NEGATIVE NEGATIVE Final    Comment:        The GeneXpert MRSA Assay (FDA approved for NASAL specimens only), is one component of a comprehensive MRSA colonization surveillance program. It is not intended to diagnose MRSA infection nor to guide or monitor treatment for MRSA infections.          Radiology Studies: Dg Chest Port 1 View  09/19/2015  CLINICAL DATA:  Hypoxia and wheezing today.  Initial encounter. EXAM: PORTABLE CHEST 1 VIEW COMPARISON:  Single-view of the chest 09/18/2015 and 09/23/2014. FINDINGS: There is a small left pleural effusion with basilar airspace disease. The right lung is clear. Lung volumes are somewhat low. Heart size is upper normal. No pneumothorax. IMPRESSION: Small left pleural effusion and basilar airspace disease which could be  atelectasis or pneumonia. Electronically Signed   By: Inge Rise M.D.   On: 09/19/2015 10:42   Dg Chest Portable 1 View  09/18/2015  CLINICAL DATA:  Fever and weakness. EXAM: PORTABLE CHEST 1 VIEW COMPARISON:  09/23/2014 FINDINGS: Shallow inspiration. Mild cardiac  enlargement. Pulmonary vascularity is mildly prominent but there is no evidence of edema or consolidation. No blunting of costophrenic angles. No pneumothorax. Calcified and tortuous aorta. Degenerative changes in the spine and shoulders IMPRESSION: Cardiac enlargement with mild pulmonary vascular congestion. No consolidation or edema. Electronically Signed   By: Lucienne Capers M.D.   On: 09/18/2015 21:59        Scheduled Meds: . amLODipine  10 mg Oral Daily  . antiseptic oral rinse  7 mL Mouth Rinse BID  . cefTRIAXone (ROCEPHIN)  IV  2 g Intravenous Q24H  . diltiazem  180 mg Oral Daily  . furosemide  40 mg Intravenous Q6H  . levothyroxine  125 mcg Oral QAC breakfast  . pantoprazole  40 mg Oral Q1200  . sodium chloride flush  3 mL Intravenous Q12H  . Warfarin - Pharmacist Dosing Inpatient   Does not apply q1800   Continuous Infusions:     LOS: 2 days    Time spent: 21min    Domenic Polite, MD [facility] Pager [phone]  If 7PM-7AM, please contact night-coverage [email or web address] Password [email or web address] 09/20/2015, 11:23 AM

## 2015-09-20 NOTE — Progress Notes (Signed)
Around 1155 am patient HR went into rapid ventricular rhythm. ECG preformed and noticed patient is now in afib rvr from NSR. MD notified of situation. Cardizem gtt to be started.

## 2015-09-20 NOTE — Progress Notes (Signed)
ANTICOAGULATION CONSULT NOTE - Follow Up  Pharmacy Consult for Warfarin Indication: atrial fibrillation  Allergies  Allergen Reactions  . Demerol Nausea And Vomiting  . Keflet [Cephalexin] Nausea And Vomiting  . Sulfa Antibiotics Nausea And Vomiting  . Tequin Nausea Only  . Penicillins Rash    Patient Measurements: Height: 5\' 4"  (162.6 cm) Weight: 149 lb 11.1 oz (67.9 kg) IBW/kg (Calculated) : 54.7  Vital Signs: Temp: 98.1 F (36.7 C) (05/18 0400) Temp Source: Oral (05/18 0400) BP: 162/67 mmHg (05/18 0713) Pulse Rate: 105 (05/18 0713)  Labs:  Recent Labs  09/18/15 2141 09/18/15 2202 09/19/15 0321 09/19/15 0619 09/19/15 1303 09/19/15 1951 09/20/15 0322  HGB 12.8 14.3  --  11.5*  --   --  11.9*  HCT 38.4 42.0  --  34.5*  --   --  35.9*  PLT 160  --   --  111*  --   --  81*  APTT  --   --   --  51*  --   --   --   LABPROT 27.7*  --   --  32.3*  --   --  39.4*  INR 2.63*  --   --  3.23*  --   --  4.20*  CREATININE 1.01* 1.00  --  1.06*  --   --  0.73  TROPONINI  --   --  0.43*  --  0.48* 0.50*  --     Estimated Creatinine Clearance: 45.2 mL/min (by C-G formula based on Cr of 0.73).   Medical History: Past Medical History  Diagnosis Date  . GERD (gastroesophageal reflux disease)   . Hypertension   . Hypothyroidism   . Cancer [facility]     left breast  . Hx of radiation therapy 2001    left breast  . A-fib ([place])   . Constipation   . HLD (hyperlipidemia)   . GERD (gastroesophageal reflux disease)     Assessment: 40 yoF on warfarin PTA for atrial fibrillation, admitted with septic shock.  Warfarin initially held on admission due to AMS.  Pharmacy consulted to resume 5/17.  PTA warfarin dose documented as: 2.5 mg daily except takes 5 mg on MWF, last dose 5/16 1600 and INR was therapeutic on admission at 2.63.  Today, 09/20/2015: - INR supratherapeutic and continues to increase, now 4.20.  No warfarin given this admission yet. - Hgb low/stable, Platelets low  and trending down. No bleeding reported.  - Poor appetite, no diet intake documented  Goal of Therapy:  INR 2-3   Plan:  Hold warfarin today due to elevated INR. Daily INR.  Hershal Coria 09/20/2015,7:58 AM

## 2015-09-20 NOTE — Clinical Social Work Note (Signed)
Clinical Social Work Assessment  Patient Details  Name: Shannon [name] MRN: [id number] Date of Birth: 1925/02/16  Date of referral:  09/20/15               Reason for consult:  Facility Placement, Discharge Planning                Permission sought to share information with:  Facility Art therapist granted to share information::  Yes, Verbal Permission Granted  Name::        Agency::     Relationship::     Contact Information:     Housing/Transportation Living arrangements for the past 2 months:  [place] of Information:  Other (Comment Required) ([place]) Patient Interpreter Needed:  None Criminal Activity/Legal Involvement Pertinent to Current Situation/Hospitalization:  No - Comment as needed Significant Relationships:  Adult Children Lives with:    Do you feel safe going back to the place where you live?  Yes Need for family participation in patient care:  Yes (Comment)  Care giving concerns:  No family at bedside.   Social Worker assessment / plan:  Pt hospitalized on 09/18/15 with a UTI. [facility] contacted CSW, on 09/19/15, to report pt is a LTC resident from their facility and family is holding bed for pt's return. CSW will confirm d/c plan with family and continue to follow to assist with d/c planning.  Employment status:  Retired Nurse, adult PT Recommendations:  Not assessed at this time Information / Referral to community resources:       Patient/Family's Response to care:  Family would like pt to return to [employer] [facility] at d/c.  Patient/Family's Understanding of and Emotional Response to Diagnosis, Current Treatment, and Prognosis:  Met with pt on 09/19/15. Pt is cognitively impaired. No family at bedside. Pt is anxious, " I want my husband to got to the drug store and get me some tylenol . " NSG made aware and reported pt recently given pain meds. Emotional  support provided.  Emotional Assessment Appearance:  Appears stated age Attitude/Demeanor/Rapport:  Other (cooperative) Affect (typically observed):  Anxious Orientation:    Alcohol / Substance use:  Not Applicable Psych involvement (Current and /or in the community):  No (Comment)  Discharge Needs  Concerns to be addressed:  Other (Comment Required (Pt is cognitively impaired. No family at bedside.) Readmission within the last 30 days:  No Current discharge risk:  None Barriers to Discharge:  No Barriers Identified   Loraine Maple  [phone] 09/20/2015, 8:45 AM

## 2015-09-20 NOTE — Progress Notes (Signed)
  Echocardiogram 2D Echocardiogram has been performed.  Darlina Sicilian M 09/20/2015, 2:38 PM

## 2015-09-20 NOTE — Progress Notes (Signed)
CSW assisting with d/c planning. Message left for pt's son, Annais Dunivan , to contact CSW to confirm d/c plan for pt  to return to [facility] when stable. Pt is not ready for d/c at this time. CSW will continue to follow.  Werner Lean LCSW [phone]

## 2015-09-20 NOTE — Progress Notes (Signed)
PT Cancellation Note  Patient Details Name: TYHESHA HOOPER MRN: [id number] DOB: [birth year]   Cancelled Treatment:     PT deferred based on RN report - pt with elevated troponin and in A-fib with RVR.  Will follow.   Nickey Canedo 09/20/2015, 1:25 PM

## 2015-09-21 LAB — PHOSPHORUS: Phosphorus: 1.4 mg/dL — ABNORMAL LOW (ref 2.5–4.6)

## 2015-09-21 LAB — CBC
HEMATOCRIT: 33.5 % — AB (ref 36.0–46.0)
Hemoglobin: 11.7 g/dL — ABNORMAL LOW (ref 12.0–15.0)
MCH: 30.5 pg (ref 26.0–34.0)
MCHC: 34.9 g/dL (ref 30.0–36.0)
MCV: 87.5 fL (ref 78.0–100.0)
Platelets: 63 10*3/uL — ABNORMAL LOW (ref 150–400)
RBC: 3.83 MIL/uL — ABNORMAL LOW (ref 3.87–5.11)
RDW: 15.3 % (ref 11.5–15.5)
WBC: 23.3 10*3/uL — ABNORMAL HIGH (ref 4.0–10.5)

## 2015-09-21 LAB — BASIC METABOLIC PANEL
Anion gap: 6 (ref 5–15)
BUN: 25 mg/dL — ABNORMAL HIGH (ref 6–20)
CALCIUM: 9.3 mg/dL (ref 8.9–10.3)
CO2: 23 mmol/L (ref 22–32)
CREATININE: 0.68 mg/dL (ref 0.44–1.00)
Chloride: 112 mmol/L — ABNORMAL HIGH (ref 101–111)
GFR calc Af Amer: >60 mL/min (ref >60)
GFR calc non Af Amer: >60 mL/min (ref >60)
GLUCOSE: 101 mg/dL — AB (ref 65–99)
Potassium: 3.1 mmol/L — ABNORMAL LOW (ref 3.5–5.1)
Sodium: 141 mmol/L (ref 135–145)

## 2015-09-21 LAB — CULTURE, BLOOD (ROUTINE X 2)

## 2015-09-21 LAB — GLUCOSE, CAPILLARY: GLUCOSE-CAPILLARY: 93 mg/dL (ref 65–99)

## 2015-09-21 LAB — PROTIME-INR
INR: 3.76 — ABNORMAL HIGH (ref 0.00–1.49)
Prothrombin Time: 35.2 seconds — ABNORMAL HIGH (ref 11.6–15.2)

## 2015-09-21 LAB — MAGNESIUM: MAGNESIUM: 2.1 mg/dL (ref 1.7–2.4)

## 2015-09-21 MED ORDER — POTASSIUM CHLORIDE CRYS ER 20 MEQ PO TBCR
40.0000 meq | EXTENDED_RELEASE_TABLET | Freq: Two times a day (BID) | ORAL | Status: DC
  Administered 2015-09-21 (×2): 40 meq via ORAL
  Filled 2015-09-21 (×2): qty 2

## 2015-09-21 MED ORDER — FUROSEMIDE 10 MG/ML IJ SOLN
40.0000 mg | Freq: Two times a day (BID) | INTRAMUSCULAR | Status: DC
  Administered 2015-09-21 – 2015-09-22 (×3): 40 mg via INTRAVENOUS
  Filled 2015-09-21 (×3): qty 4

## 2015-09-21 MED ORDER — DILTIAZEM HCL ER COATED BEADS 180 MG PO CP24
180.0000 mg | ORAL_CAPSULE | Freq: Every day | ORAL | Status: DC
  Administered 2015-09-21 – 2015-09-24 (×4): 180 mg via ORAL
  Filled 2015-09-21 (×4): qty 1

## 2015-09-21 NOTE — Progress Notes (Signed)
PROGRESS NOTE    Shannon [name]  [id number] DOB: [birth year] DOA: 09/18/2015 PCP: [name], MD  Brief Narrative: Shannon [name] is a 82 y.o. female from SNF with medical history significant of atrial fibrillation on Coumadin, hypertension, hyperlipidemia, GERD, hypothyroidism, depression, left breast cancer 2011 (s/p of XRT and surgery per her son), who presented with fever, nausea, vomiting and altered mental status. ED Course: pt was found to have positive urinalysis for UTI, lactate 2.07-->7.4, WBC 11.4, INR 2.63, temperature 103.5, tachycardia, tachypnea, oxygen saturation 94%, potassium 3.4, creatinine 1.01. Chest x-ray showed pulmonary vascular congestion, but no infiltration. Initially bp was 103/63, but dropped to 88/46 later. Started on pressors  Assessment & Plan:  Klebsiella Septic shock due to UTI  -s/p 4.5L NS and then started on Neo synephrine, off pressors since 11am 5/17 -Blood Cx with Klebsiella and enterobacter species on PCR, sensitivities pending -repeat Blood cx -Stopped Vanco and cefepime 5/18 and changed to IV ceftriaxone, FU sensitivities -sepsis physiology improved  Acute encephalopathy: Most likely due to UTI and sepsis -has underlying dementia too, improving  Volume overload -iatrogenic from fluid resuscitation -continue IV lasix today  Hypothyroidism: TSH was 1.110on 12/06/09. -resumed PO synthroid  Atrial Fibrillation with RVR - CHA2DS2-VASc Score is 4, needs oral anticoagulation. Patient is on Coumadin. INR is 2.63 on admission. -HR improved today -was on cardizem gtt yesterday, wean this -continue PO Cardizem, coumadin  HTN:  -resume cardizem  GERD: -PPI  Hypokalemia: -Replete  HLD: Last LDL was 73 on 12/07/15  DVT ppx: warfarin  Code Status: DNR Family Communication:daughter in law at bed side 5/18 Disposition Plan: Keep in SDU, DC to SNF when stable   Consultants:   PCCM   Procedures:    Antimicrobials:  Subjective: " feels better, some dyspnea"  Objective: Filed Vitals:   09/21/15 0500 09/21/15 0600 09/21/15 0800 09/21/15 0801  BP: 128/52 112/52 128/60   Pulse: 64 62 65   Temp:    97.8 F (36.6 C)  TempSrc:    Axillary  Resp: 29 33 20   Height:      Weight:      SpO2: 96% 96% 100%     Intake/Output Summary (Last 24 hours) at 09/21/15 1127 Last data filed at 09/21/15 0600  Gross per 24 hour  Intake 3724.92 ml  Output      0 ml  Net 3724.92 ml   Filed Weights   09/18/15 2125 09/19/15 0100  Weight: 63.504 kg (140 lb) 67.9 kg (149 lb 11.1 oz)    Examination:  General exam: Appears calm and comfortable  Respiratory system: crackles in both lower lungs Cardiovascular system: S1 & S2 heard, RRR. No JVD, murmurs, rubs, gallops or clicks. No pedal edema. Gastrointestinal system: Abdomen is nondistended, soft and nontender. No organomegaly or masses felt. Normal bowel sounds  Central nervous system: Alert and oriented. No focal neurological deficits. Extremities: Symmetric 5 x 5 power. Skin: No rashes, lesions or ulcers Psychiatry: Judgement and insight appear normal. Mood & affect appropriate.     Data Reviewed: I have personally reviewed following labs and imaging studies  CBC:  Recent Labs Lab 09/18/15 2141 09/18/15 2202 09/19/15 0619 09/20/15 0322 09/21/15 0313  WBC 11.4*  --  16.6* 23.1* 23.3*  NEUTROABS 10.8*  --   --   --   --   HGB 12.8 14.3 11.5* 11.9* 11.7*  HCT 38.4 42.0 34.5* 35.9* 33.5*  MCV 91.2  --  91.3 91.3 87.5  PLT 160  --  111* 81* 63*   Basic Metabolic Panel:  Recent Labs Lab 09/18/15 2141 09/18/15 2202 09/19/15 0321 09/19/15 0619 09/20/15 0322 09/21/15 0313  NA 139 142  --  142 141 141  K 3.4* 3.4*  --  3.0* 3.2* 3.1*  CL 110 107  --  118* 115* 112*  CO2 22  --   --  17* 20* 23  GLUCOSE 112* 107*  --  112* 92 101*  BUN 19 18  --  16 16 25*  CREATININE 1.01* 1.00  --  1.06* 0.73 0.68  CALCIUM 9.8  --   --   8.3* 8.9 9.3  MG  --   --  1.3* 1.4* 2.0 2.1  PHOS  --   --   --  2.0* 2.9 1.4*   GFR: Estimated Creatinine Clearance: 45.2 mL/min (by C-G formula based on Cr of 0.68). Liver Function Tests:  Recent Labs Lab 09/18/15 2141 09/19/15 0619  AST 78* 53*  ALT 49 37  ALKPHOS 79 50  BILITOT 0.9 0.8  PROT 5.8* 4.8*  ALBUMIN 3.4* 2.6*   No results for input(s): LIPASE, AMYLASE in the last 168 hours. No results for input(s): AMMONIA in the last 168 hours. Coagulation Profile:  Recent Labs Lab 09/18/15 2141 09/19/15 0619 09/20/15 0322 09/21/15 0313  INR 2.63* 3.23* 4.20* 3.76*   Cardiac Enzymes:  Recent Labs Lab 09/19/15 0321 09/19/15 1303 09/19/15 1951  TROPONINI 0.43* 0.48* 0.50*   BNP (last 3 results) No results for input(s): PROBNP in the last 8760 hours. HbA1C: No results for input(s): HGBA1C in the last 72 hours. CBG:  Recent Labs Lab 09/19/15 0747 09/20/15 0745 09/21/15 0730  GLUCAP 103* 82 93   Lipid Profile: No results for input(s): CHOL, HDL, LDLCALC, TRIG, CHOLHDL, LDLDIRECT in the last 72 hours. Thyroid Function Tests: No results for input(s): TSH, T4TOTAL, FREET4, T3FREE, THYROIDAB in the last 72 hours. Anemia Panel: No results for input(s): VITAMINB12, FOLATE, FERRITIN, TIBC, IRON, RETICCTPCT in the last 72 hours. Urine analysis:    Component Value Date/Time   COLORURINE YELLOW 09/18/2015 2202   APPEARANCEUR CLOUDY* 09/18/2015 2202   LABSPEC 1.017 09/18/2015 2202   PHURINE 7.0 09/18/2015 2202   GLUCOSEU NEGATIVE 09/18/2015 2202   HGBUR MODERATE* 09/18/2015 2202   BILIRUBINUR NEGATIVE 09/18/2015 2202   KETONESUR NEGATIVE 09/18/2015 2202   PROTEINUR NEGATIVE 09/18/2015 2202   UROBILINOGEN 0.2 09/23/2014 2201   NITRITE POSITIVE* 09/18/2015 2202   LEUKOCYTESUR MODERATE* 09/18/2015 2202   Sepsis Labs: @LABRCNTIP (procalcitonin:4,lacticidven:4)  ) Recent Results (from the past 240 hour(s))  Culture, blood (routine x 2)     Status: Abnormal  (Preliminary result)   Collection Time: 09/18/15  9:41 PM  Result Value Ref Range Status   Specimen Description BLOOD RIGHT HAND  Final   Special Requests BOTTLES DRAWN AEROBIC AND ANAEROBIC 5CC  Final   Culture  Setup Time   Final    GRAM NEGATIVE RODS IN BOTH AEROBIC AND ANAEROBIC BOTTLES Organism ID to follow CRITICAL RESULT CALLED TO, READ BACK BY AND VERIFIED WITH: Moishe Spice D AT [place] ON [id number] BY M WILSON    Culture (A)  Final    KLEBSIELLA PNEUMONIAE SUSCEPTIBILITIES TO FOLLOW Performed at [facility]    Report Status PENDING  Incomplete  Culture, blood (routine x 2)     Status: None (Preliminary result)   Collection Time: 09/18/15  9:41 PM  Result Value Ref Range Status   Specimen Description BLOOD RIGHT ANTECUBITAL  Final  Special Requests BOTTLES DRAWN AEROBIC AND ANAEROBIC 10CC  Final   Culture  Setup Time   Final    GRAM NEGATIVE RODS IN BOTH AEROBIC AND ANAEROBIC BOTTLES CRITICAL RESULT CALLED TO, READ BACK BY AND VERIFIED WITH: Moishe Spice D AT [place] ON [id number] BY Ronnie Derby Performed at [place]  Final   Report Status PENDING  Incomplete  Blood Culture ID Panel (Reflexed)     Status: Abnormal   Collection Time: 09/18/15  9:41 PM  Result Value Ref Range Status   Enterococcus species NOT DETECTED NOT DETECTED Final   Vancomycin resistance NOT DETECTED NOT DETECTED Final   Listeria monocytogenes NOT DETECTED NOT DETECTED Final   Staphylococcus species NOT DETECTED NOT DETECTED Final   Staphylococcus aureus NOT DETECTED NOT DETECTED Final   Methicillin resistance NOT DETECTED NOT DETECTED Final   Streptococcus species NOT DETECTED NOT DETECTED Final   Streptococcus agalactiae NOT DETECTED NOT DETECTED Final   Streptococcus pneumoniae NOT DETECTED NOT DETECTED Final   Streptococcus pyogenes NOT DETECTED NOT DETECTED Final   Acinetobacter baumannii NOT DETECTED NOT DETECTED Final   Enterobacteriaceae  species DETECTED (A) NOT DETECTED Final    Comment: CRITICAL RESULT CALLED TO, READ BACK BY AND VERIFIED WITH: EBurman Foster.D. 11:20 09/19/15 ([email or web address])    Enterobacter cloacae complex NOT DETECTED NOT DETECTED Final   Escherichia coli NOT DETECTED NOT DETECTED Final   Klebsiella oxytoca NOT DETECTED NOT DETECTED Final   Klebsiella pneumoniae DETECTED (A) NOT DETECTED Final    Comment: CRITICAL RESULT CALLED TO, READ BACK BY AND VERIFIED WITH: EBurman Foster.D. 11:20 09/19/15 ([email or web address])    Proteus species NOT DETECTED NOT DETECTED Final   Serratia marcescens NOT DETECTED NOT DETECTED Final   Carbapenem resistance NOT DETECTED NOT DETECTED Final   Haemophilus influenzae NOT DETECTED NOT DETECTED Final   Neisseria meningitidis NOT DETECTED NOT DETECTED Final   Pseudomonas aeruginosa NOT DETECTED NOT DETECTED Final   Candida albicans NOT DETECTED NOT DETECTED Final   Candida glabrata NOT DETECTED NOT DETECTED Final   Candida krusei NOT DETECTED NOT DETECTED Final   Candida parapsilosis NOT DETECTED NOT DETECTED Final   Candida tropicalis NOT DETECTED NOT DETECTED Final  Urine culture     Status: Abnormal (Preliminary result)   Collection Time: 09/18/15 10:02 PM  Result Value Ref Range Status   Specimen Description URINE, CATHETERIZED  Final   Special Requests NONE  Final   Culture (A)  Final    >=100,000 COLONIES/mL GRAM NEGATIVE RODS IDENTIFICATION AND SUSCEPTIBILITIES TO FOLLOW Performed at [facility]    Report Status PENDING  Incomplete  MRSA PCR Screening     Status: None   Collection Time: 09/19/15  4:59 AM  Result Value Ref Range Status   MRSA by PCR NEGATIVE NEGATIVE Final    Comment:        The GeneXpert MRSA Assay (FDA approved for NASAL specimens only), is one component of a comprehensive MRSA colonization surveillance program. It is not intended to diagnose MRSA infection nor to guide or monitor treatment for MRSA infections.          Radiology  Studies: No results found.      Scheduled Meds: . antiseptic oral rinse  7 mL Mouth Rinse BID  . cefTRIAXone (ROCEPHIN)  IV  2 g Intravenous Q24H  . diltiazem  180 mg Oral Daily  . furosemide  40 mg Intravenous BID  . levothyroxine  125 mcg Oral QAC breakfast  . pantoprazole  40 mg Oral Q1200  . potassium chloride  40 mEq Oral BID  . sodium chloride flush  3 mL Intravenous Q12H  . Warfarin - Pharmacist Dosing Inpatient   Does not apply q1800   Continuous Infusions: . diltiazem (CARDIZEM) infusion 5 mg/hr (09/21/15 0745)     LOS: 3 days    Time spent: 71min    Domenic Polite, MD [facility] Pager [phone]  If 7PM-7AM, please contact night-coverage [email or web address] Password [facility] 09/21/2015, 11:27 AM

## 2015-09-21 NOTE — Progress Notes (Signed)
ANTICOAGULATION CONSULT NOTE - Follow Up  Pharmacy Consult for Warfarin Indication: atrial fibrillation  Allergies  Allergen Reactions  . Demerol Nausea And Vomiting  . Keflet [Cephalexin] Nausea And Vomiting  . Sulfa Antibiotics Nausea And Vomiting  . Tequin Nausea Only  . Penicillins Rash    Patient Measurements: Height: 5\' 4"  (162.6 cm) Weight: 149 lb 11.1 oz (67.9 kg) IBW/kg (Calculated) : 54.7  Vital Signs: Temp: 99.2 F (37.3 C) (05/19 0400) Temp Source: Oral (05/19 0400) BP: 112/52 mmHg (05/19 0600) Pulse Rate: 62 (05/19 0600)  Labs:  Recent Labs  09/19/15 0321 09/19/15 0619 09/19/15 1303 09/19/15 1951 09/20/15 0322 09/21/15 0313  HGB  --  11.5*  --   --  11.9* 11.7*  HCT  --  34.5*  --   --  35.9* 33.5*  PLT  --  111*  --   --  81* 63*  APTT  --  51*  --   --   --   --   LABPROT  --  32.3*  --   --  39.4* 35.2*  INR  --  3.23*  --   --  4.20* 3.76*  CREATININE  --  1.06*  --   --  0.73 0.68  TROPONINI 0.43*  --  0.48* 0.50*  --   --     Estimated Creatinine Clearance: 45.2 mL/min (by C-G formula based on Cr of 0.68).   Medical History: Past Medical History  Diagnosis Date  . GERD (gastroesophageal reflux disease)   . Hypertension   . Hypothyroidism   . Cancer [facility])     left breast  . Hx of radiation therapy 2001    left breast  . A-fib ([place])   . Constipation   . HLD (hyperlipidemia)   . GERD (gastroesophageal reflux disease)     Assessment: Shannon [name] on warfarin PTA for atrial fibrillation, admitted with septic shock.  Warfarin initially held on admission due to AMS.  Pharmacy consulted to resume 5/17.  PTA warfarin dose documented as: 2.5 mg daily except takes 5 mg on MWF, last dose 5/16 1600 and INR was therapeutic on admission at 2.63.  Today, 09/21/2015: - INR supratherapeutic but appears now to be trending downward.  No warfarin given this admission yet. - Hgb low/stable, Platelets low and trending down. No bleeding reported.  - Poor  appetite, no diet intake documented - No documented bleeding  Goal of Therapy:  INR 2-3   Plan:   Hold warfarin today due to elevated INR.  Daily INR.  Watch plaelet count closely  Doreene Eland, PharmD, BCPS.   Pager: [phone] 09/21/2015 7:44 AM

## 2015-09-21 NOTE — Evaluation (Signed)
Physical Therapy Evaluation Patient Details Name: Shannon [name] MRN: [id number] DOB: Nov 21, 1924 Today's Date: 09/21/2015   History of Present Illness  Pt admitted from SNF with c/o fever, confusion and vomiting.  Pt with hx of Breast CA, A-Fib, Right IM nailing (5/16) and dementia  Clinical Impression  Pt admitted as above and presenting with functional mobility limitations 2* generalized weakness, SOB with exertion and balance deficits.  Pt would benefit from follow up at SNF level.    Follow Up Recommendations SNF    Equipment Recommendations  None recommended by PT    Recommendations for Other Services OT consult     Precautions / Restrictions Precautions Precautions: Fall Restrictions Weight Bearing Restrictions: No      Mobility  Bed Mobility Overal bed mobility: Needs Assistance;+2 for physical assistance;+ 2 for safety/equipment Bed Mobility: Supine to Sit;Rolling Rolling: Mod assist   Supine to sit: Mod assist     General bed mobility comments: cues for sequence and use of Pad to assist pt to EOB  Transfers Overall transfer level: Needs assistance Equipment used: Rolling walker (2 wheeled) Transfers: Sit to/from Stand Sit to Stand: Mod assist;Total assist;From elevated surface;+2 safety/equipment;+2 physical assistance         General transfer comment: cues for LE management and use of UEs to self assist.  Physical assist to bring wt up and fwd and to balance in standing  Ambulation/Gait Ambulation/Gait assistance: Mod assist;+2 physical assistance;+2 safety/equipment Ambulation Distance (Feet): 2 Feet Assistive device: Rolling walker (2 wheeled) Gait Pattern/deviations: Step-to pattern;Decreased step length - right;Decreased step length - left;Shuffle;Trunk flexed     General Gait Details: cues for sequence, posture and position from RW.  Assist for balance, support and walker management.  Ltd by SOB wtih exertion  Stairs            Wheelchair  Mobility    Modified Rankin (Stroke Patients Only)       Balance Overall balance assessment: Needs assistance Sitting-balance support: Feet supported Sitting balance-Leahy Scale: Fair     Standing balance support: Bilateral upper extremity supported Standing balance-Leahy Scale: Poor                               Pertinent Vitals/Pain Pain Assessment: No/denies pain    Home Living Family/patient expects to be discharged to:: Skilled nursing facility                      Prior Function Level of Independence: Needs assistance         Comments: Pt is not reliable historian - states she ambulates unassisted but per chart, limited ambulation and use of WC primarily at SNF     Hand Dominance        Extremity/Trunk Assessment   Upper Extremity Assessment: Generalized weakness           Lower Extremity Assessment: Generalized weakness      Cervical / Trunk Assessment: Kyphotic  Communication   Communication: No difficulties  Cognition Arousal/Alertness: Awake/alert Behavior During Therapy: WFL for tasks assessed/performed Overall Cognitive Status: History of cognitive impairments - at baseline                      General Comments      Exercises        Assessment/Plan    PT Assessment Patient needs continued PT services  PT Diagnosis Difficulty walking   PT  Problem List Decreased strength;Decreased activity tolerance;Decreased balance;Decreased mobility;Decreased cognition;Decreased knowledge of use of DME;Decreased safety awareness;Cardiopulmonary status limiting activity  PT Treatment Interventions DME instruction;Gait training;Functional mobility training;Therapeutic activities;Therapeutic exercise;Patient/family education   PT Goals (Current goals can be found in the Care Plan section) Acute Rehab PT Goals Patient Stated Goal: OOB PT Goal Formulation: With patient Time For Goal Achievement: 10/05/15 Potential to  Achieve Goals: Fair    Frequency Min 3X/week   Barriers to discharge        Co-evaluation               End of Session Equipment Utilized During Treatment: Gait belt Activity Tolerance: Patient limited by fatigue Patient left: in chair;with call bell/phone within reach;with chair alarm set Nurse Communication: Mobility status         Time: [id number] PT Time Calculation (min) (ACUTE ONLY): 27 min   Charges:   PT Evaluation $PT Eval Moderate Complexity: 1 Procedure PT Treatments $Therapeutic Activity: 8-22 mins   PT G Codes:        Jerrick Farve 10-07-2015, 3:18 PM

## 2015-09-21 NOTE — Progress Notes (Signed)
CSW assisting with d/c planning. Pt is not stable for d/c. [facility] is able to admit pt over the weekend if pt's status improves. Weekend CSW will assist with d/c planning needs.  Werner Lean LCSW [phone]

## 2015-09-22 LAB — URINE CULTURE: Culture: >=100000 — AB

## 2015-09-22 LAB — BASIC METABOLIC PANEL
Anion gap: 9 (ref 5–15)
BUN: 22 mg/dL — ABNORMAL HIGH (ref 6–20)
CALCIUM: 9.2 mg/dL (ref 8.9–10.3)
CO2: 25 mmol/L (ref 22–32)
CREATININE: 0.6 mg/dL (ref 0.44–1.00)
Chloride: 105 mmol/L (ref 101–111)
GFR calc non Af Amer: >60 mL/min (ref >60)
GLUCOSE: 83 mg/dL (ref 65–99)
Potassium: 2.9 mmol/L — ABNORMAL LOW (ref 3.5–5.1)
Sodium: 139 mmol/L (ref 135–145)

## 2015-09-22 LAB — CBC
HEMATOCRIT: 36.7 % (ref 36.0–46.0)
Hemoglobin: 12.3 g/dL (ref 12.0–15.0)
MCH: 30.1 pg (ref 26.0–34.0)
MCHC: 33.5 g/dL (ref 30.0–36.0)
MCV: 89.7 fL (ref 78.0–100.0)
PLATELETS: 74 10*3/uL — AB (ref 150–400)
RBC: 4.09 MIL/uL (ref 3.87–5.11)
RDW: 15.2 % (ref 11.5–15.5)
WBC: 14.9 10*3/uL — ABNORMAL HIGH (ref 4.0–10.5)

## 2015-09-22 LAB — GLUCOSE, CAPILLARY: Glucose-Capillary: 113 mg/dL — ABNORMAL HIGH (ref 65–99)

## 2015-09-22 LAB — PHOSPHORUS: PHOSPHORUS: 1.1 mg/dL — AB (ref 2.5–4.6)

## 2015-09-22 LAB — PROTIME-INR
INR: 2.77 — AB (ref 0.00–1.49)
Prothrombin Time: 28 seconds — ABNORMAL HIGH (ref 11.6–15.2)

## 2015-09-22 LAB — MAGNESIUM: MAGNESIUM: 1.7 mg/dL (ref 1.7–2.4)

## 2015-09-22 MED ORDER — POTASSIUM CHLORIDE CRYS ER 20 MEQ PO TBCR
60.0000 meq | EXTENDED_RELEASE_TABLET | Freq: Three times a day (TID) | ORAL | Status: AC
  Administered 2015-09-22 (×3): 60 meq via ORAL
  Filled 2015-09-22 (×3): qty 3

## 2015-09-22 MED ORDER — WARFARIN SODIUM 2 MG PO TABS
2.0000 mg | ORAL_TABLET | Freq: Once | ORAL | Status: AC
  Administered 2015-09-22: 2 mg via ORAL
  Filled 2015-09-22 (×2): qty 1

## 2015-09-22 MED ORDER — K PHOS MONO-SOD PHOS DI & MONO 155-852-130 MG PO TABS
500.0000 mg | ORAL_TABLET | Freq: Three times a day (TID) | ORAL | Status: AC
  Administered 2015-09-22 (×3): 500 mg via ORAL
  Filled 2015-09-22 (×3): qty 2

## 2015-09-22 MED ORDER — FUROSEMIDE 10 MG/ML IJ SOLN
20.0000 mg | Freq: Two times a day (BID) | INTRAMUSCULAR | Status: DC
  Administered 2015-09-22: 20 mg via INTRAVENOUS
  Filled 2015-09-22 (×3): qty 2

## 2015-09-22 MED ORDER — MAGNESIUM SULFATE 2 GM/50ML IV SOLN
2.0000 g | Freq: Once | INTRAVENOUS | Status: AC
  Administered 2015-09-22: 2 g via INTRAVENOUS
  Filled 2015-09-22: qty 50

## 2015-09-22 NOTE — Progress Notes (Signed)
Pt received in room 1518 in stable condition. Assumed care of pt. VWilliams,rn.

## 2015-09-22 NOTE — Progress Notes (Signed)
PROGRESS NOTE    Shannon [name]  [id number] DOB: Dec 14, 1924 DOA: 09/18/2015 PCP: [name], MD  Brief Narrative: Shannon [name] is a 82 y.o. female from [facility] with medical history significant of atrial fibrillation on Coumadin, hypertension, hyperlipidemia, GERD, hypothyroidism, depression, left breast cancer 2011 (s/p of XRT and surgery per her son), who presented with fever, nausea, vomiting and altered mental status. ED Course: pt was found to have positive urinalysis for UTI, lactate 2.07-->7.4, WBC 11.4, INR 2.63, temperature 103.5, tachycardia, tachypnea, oxygen saturation 94%, potassium 3.4, creatinine 1.01. Chest x-ray showed pulmonary vascular congestion, but no infiltration. Initially bp was 103/63, but dropped to 88/46 later. Started on pressors  Assessment & Plan:  Klebsiella Septic shock due to UTI  -s/p 4.5L NS and then started on Neo synephrine, off pressors since 11am 5/17 -Blood Cx with Klebsiella and enterobacter species on PCR, urine cx with Klebsiella -repeat Blood cx-NGTD -Stopped Vanco and cefepime 5/18 and changed to IV ceftriaxone, sensitivities noted -sepsis physiology improved -change to Oral Keflex tomorrow if repeat Blood Cx stays negative  Acute encephalopathy: Most likely due to UTI and sepsis -has underlying dementia too, improved  Volume overload -iatrogenic from fluid resuscitation -continue IV lasix today, cut down dose to 20mg   Hypothyroidism: TSH was 1.110on 12/06/09. -resumed PO synthroid  Atrial Fibrillation with RVR - CHA2DS2-VASc Score is 4, needs oral anticoagulation. Patient is on Coumadin. INR is 2.63 on admission. -HR improved today -was on cardizem gtt, now off -continue PO Cardizem, coumadin  HTN:  -continue  cardizem  GERD: -PPI  Hypokalemia: -Replete  HLD: Last LDL was 73 on 12/07/15  DVT ppx: warfarin  Code Status: DNR Family Communication:none at bedside, d/w daughter in law  5/18 Disposition Plan:[place] to floor,  ? snf in few days   Consultants:   PCCM   Procedures:   Antimicrobials:  Subjective: " feels better, some dyspnea", no event overnight  Objective: Filed Vitals:   09/22/15 0500 09/22/15 0600 09/22/15 0700 09/22/15 0800  BP: 118/71 138/69 117/71 135/82  Pulse: 72 67 70 78  Temp:    98.9 F (37.2 C)  TempSrc:    Oral  Resp: 21 22 25 26   Height:      Weight:      SpO2: 97% 98% 97% 97%    Intake/Output Summary (Last 24 hours) at 09/22/15 1124 Last data filed at 09/22/15 0800  Gross per 24 hour  Intake    350 ml  Output      0 ml  Net    350 ml   Filed Weights   09/18/15 2125 09/19/15 0100  Weight: 63.504 kg (140 lb) 67.9 kg (149 lb 11.1 oz)    Examination:  General exam: Appears calm and comfortable, no distress Respiratory system: crackles in both lower lungs Cardiovascular system: S1 & S2 heard, RRR. No JVD, murmurs, rubs, gallops or clicks. No pedal edema. Gastrointestinal system: Abdomen is nondistended, soft and nontender. No organomegaly or masses felt. Normal bowel sounds  Central nervous system: Alert and oriented. No focal neurological deficits. Extremities: Symmetric 5 x 5 power. Skin: No rashes, lesions or ulcers Psychiatry: Judgement and insight appear normal. Mood & affect appropriate.     Data Reviewed: I have personally reviewed following labs and imaging studies  CBC:  Recent Labs Lab 09/18/15 2141 09/18/15 2202 09/19/15 0619 09/20/15 0322 09/21/15 0313 09/22/15 0316  WBC 11.4*  --  16.6* 23.1* 23.3* 14.9*  NEUTROABS 10.8*  --   --   --   --   --  HGB 12.8 14.3 11.5* 11.9* 11.7* 12.3  HCT 38.4 42.0 34.5* 35.9* 33.5* 36.7  MCV 91.2  --  91.3 91.3 87.5 89.7  PLT 160  --  111* 81* 63* 74*   Basic Metabolic Panel:  Recent Labs Lab 09/18/15 2141 09/18/15 2202 09/19/15 0321 09/19/15 0619 09/20/15 0322 09/21/15 0313 09/22/15 0316  NA 139 142  --  142 141 141 139  K 3.4* 3.4*  --  3.0* 3.2* 3.1* 2.9*  CL 110 107  --  118* 115*  112* 105  CO2 22  --   --  17* 20* 23 25  GLUCOSE 112* 107*  --  112* 92 101* 83  BUN 19 18  --  16 16 25* 22*  CREATININE 1.01* 1.00  --  1.06* 0.73 0.68 0.60  CALCIUM 9.8  --   --  8.3* 8.9 9.3 9.2  MG  --   --  1.3* 1.4* 2.0 2.1 1.7  PHOS  --   --   --  2.0* 2.9 1.4* 1.1*   GFR: Estimated Creatinine Clearance: 45.2 mL/min (by C-G formula based on Cr of 0.6). Liver Function Tests:  Recent Labs Lab 09/18/15 2141 09/19/15 0619  AST 78* 53*  ALT 49 37  ALKPHOS 79 50  BILITOT 0.9 0.8  PROT 5.8* 4.8*  ALBUMIN 3.4* 2.6*   No results for input(s): LIPASE, AMYLASE in the last 168 hours. No results for input(s): AMMONIA in the last 168 hours. Coagulation Profile:  Recent Labs Lab 09/18/15 2141 09/19/15 0619 09/20/15 0322 09/21/15 0313 09/22/15 0316  INR 2.63* 3.23* 4.20* 3.76* 2.77*   Cardiac Enzymes:  Recent Labs Lab 09/19/15 0321 09/19/15 1303 09/19/15 1951  TROPONINI 0.43* 0.48* 0.50*   BNP (last 3 results) No results for input(s): PROBNP in the last 8760 hours. HbA1C: No results for input(s): HGBA1C in the last 72 hours. CBG:  Recent Labs Lab 09/19/15 0747 09/20/15 0745 09/21/15 0730 09/22/15 0754  GLUCAP 103* 82 93 113*   Lipid Profile: No results for input(s): CHOL, HDL, LDLCALC, TRIG, CHOLHDL, LDLDIRECT in the last 72 hours. Thyroid Function Tests: No results for input(s): TSH, T4TOTAL, FREET4, T3FREE, THYROIDAB in the last 72 hours. Anemia Panel: No results for input(s): VITAMINB12, FOLATE, FERRITIN, TIBC, IRON, RETICCTPCT in the last 72 hours. Urine analysis:    Component Value Date/Time   COLORURINE YELLOW 09/18/2015 2202   APPEARANCEUR CLOUDY* 09/18/2015 2202   LABSPEC 1.017 09/18/2015 2202   PHURINE 7.0 09/18/2015 2202   GLUCOSEU NEGATIVE 09/18/2015 2202   HGBUR MODERATE* 09/18/2015 2202   BILIRUBINUR NEGATIVE 09/18/2015 2202   KETONESUR NEGATIVE 09/18/2015 2202   PROTEINUR NEGATIVE 09/18/2015 2202   UROBILINOGEN 0.2 09/23/2014 2201    NITRITE POSITIVE* 09/18/2015 2202   LEUKOCYTESUR MODERATE* 09/18/2015 2202   Sepsis Labs: @LABRCNTIP (procalcitonin:4,lacticidven:4)  ) Recent Results (from the past 240 hour(s))  Culture, blood (routine x 2)     Status: Abnormal   Collection Time: 09/18/15  9:41 PM  Result Value Ref Range Status   Specimen Description BLOOD RIGHT HAND  Final   Special Requests BOTTLES DRAWN AEROBIC AND ANAEROBIC 5CC  Final   Culture  Setup Time   Final    GRAM NEGATIVE RODS IN BOTH AEROBIC AND ANAEROBIC BOTTLES Organism ID to follow CRITICAL RESULT CALLED TO, READ BACK BY AND VERIFIED WITHMoishe Spice D AT 1120 ON [id number] BY Ronnie Derby Performed at [facility] (A)  Final   Report Status 09/21/2015  FINAL  Final   Organism ID, Bacteria KLEBSIELLA PNEUMONIAE  Final      Susceptibility   Klebsiella pneumoniae - MIC*    AMPICILLIN 16 RESISTANT Resistant     CEFAZOLIN <=4 SENSITIVE Sensitive     CEFEPIME <=1 SENSITIVE Sensitive     CEFTAZIDIME <=1 SENSITIVE Sensitive     CEFTRIAXONE <=1 SENSITIVE Sensitive     CIPROFLOXACIN <=0.25 SENSITIVE Sensitive     GENTAMICIN <=1 SENSITIVE Sensitive     IMIPENEM <=0.25 SENSITIVE Sensitive     TRIMETH/SULFA >=320 RESISTANT Resistant     AMPICILLIN/SULBACTAM 4 SENSITIVE Sensitive     PIP/TAZO <=4 SENSITIVE Sensitive     * KLEBSIELLA PNEUMONIAE  Culture, blood (routine x 2)     Status: Abnormal   Collection Time: 09/18/15  9:41 PM  Result Value Ref Range Status   Specimen Description BLOOD RIGHT ANTECUBITAL  Final   Special Requests BOTTLES DRAWN AEROBIC AND ANAEROBIC 10CC  Final   Culture  Setup Time   Final    GRAM NEGATIVE RODS IN BOTH AEROBIC AND ANAEROBIC BOTTLES CRITICAL RESULT CALLED TO, READ BACK BY AND VERIFIED WITH: E. JACKSON, PHARM D AT 1120 ON [id number] BY M. WILSON    Culture (A)  Final    KLEBSIELLA PNEUMONIAE SUSCEPTIBILITIES PERFORMED ON PREVIOUS CULTURE WITHIN THE LAST 5 DAYS. Performed at  [facility]    Report Status 09/21/2015 FINAL  Final  Blood Culture ID Panel (Reflexed)     Status: Abnormal   Collection Time: 09/18/15  9:41 PM  Result Value Ref Range Status   Enterococcus species NOT DETECTED NOT DETECTED Final   Vancomycin resistance NOT DETECTED NOT DETECTED Final   Listeria monocytogenes NOT DETECTED NOT DETECTED Final   Staphylococcus species NOT DETECTED NOT DETECTED Final   Staphylococcus aureus NOT DETECTED NOT DETECTED Final   Methicillin resistance NOT DETECTED NOT DETECTED Final   Streptococcus species NOT DETECTED NOT DETECTED Final   Streptococcus agalactiae NOT DETECTED NOT DETECTED Final   Streptococcus pneumoniae NOT DETECTED NOT DETECTED Final   Streptococcus pyogenes NOT DETECTED NOT DETECTED Final   Acinetobacter baumannii NOT DETECTED NOT DETECTED Final   Enterobacteriaceae species DETECTED (A) NOT DETECTED Final    Comment: CRITICAL RESULT CALLED TO, READ BACK BY AND VERIFIED WITH: EBurman Foster.D. 11:20 09/19/15 ([email or web address])    Enterobacter cloacae complex NOT DETECTED NOT DETECTED Final   Escherichia coli NOT DETECTED NOT DETECTED Final   Klebsiella oxytoca NOT DETECTED NOT DETECTED Final   Klebsiella pneumoniae DETECTED (A) NOT DETECTED Final    Comment: CRITICAL RESULT CALLED TO, READ BACK BY AND VERIFIED WITH: EBurman Foster.D. 11:20 09/19/15 ([email or web address])    Proteus species NOT DETECTED NOT DETECTED Final   Serratia marcescens NOT DETECTED NOT DETECTED Final   Carbapenem resistance NOT DETECTED NOT DETECTED Final   Haemophilus influenzae NOT DETECTED NOT DETECTED Final   Neisseria meningitidis NOT DETECTED NOT DETECTED Final   Pseudomonas aeruginosa NOT DETECTED NOT DETECTED Final   Candida albicans NOT DETECTED NOT DETECTED Final   Candida glabrata NOT DETECTED NOT DETECTED Final   Candida krusei NOT DETECTED NOT DETECTED Final   Candida parapsilosis NOT DETECTED NOT DETECTED Final   Candida tropicalis NOT DETECTED NOT DETECTED  Final  Urine culture     Status: Abnormal   Collection Time: 09/18/15 10:02 PM  Result Value Ref Range Status   Specimen Description URINE, CATHETERIZED  Final   Special Requests NONE  Final   Culture >=100,000 COLONIES/mL KLEBSIELLA  PNEUMONIAE (A)  Final   Report Status 09/22/2015 FINAL  Final   Organism ID, Bacteria KLEBSIELLA PNEUMONIAE (A)  Final      Susceptibility   Klebsiella pneumoniae - MIC*    AMPICILLIN 16 RESISTANT Resistant     CEFAZOLIN <=4 SENSITIVE Sensitive     CEFTRIAXONE <=1 SENSITIVE Sensitive     CIPROFLOXACIN <=0.25 SENSITIVE Sensitive     GENTAMICIN <=1 SENSITIVE Sensitive     IMIPENEM <=0.25 SENSITIVE Sensitive     NITROFURANTOIN 64 INTERMEDIATE Intermediate     TRIMETH/SULFA >=320 RESISTANT Resistant     AMPICILLIN/SULBACTAM 4 SENSITIVE Sensitive     PIP/TAZO <=4 SENSITIVE Sensitive     * >=100,000 COLONIES/mL KLEBSIELLA PNEUMONIAE  MRSA PCR Screening     Status: None   Collection Time: 09/19/15  4:59 AM  Result Value Ref Range Status   MRSA by PCR NEGATIVE NEGATIVE Final    Comment:        The GeneXpert MRSA Assay (FDA approved for NASAL specimens only), is one component of a comprehensive MRSA colonization surveillance program. It is not intended to diagnose MRSA infection nor to guide or monitor treatment for MRSA infections.   Culture, blood (routine x 2)     Status: None (Preliminary result)   Collection Time: 09/21/15  8:00 AM  Result Value Ref Range Status   Specimen Description BLOOD LEFT HAND  Final   Special Requests IN PEDIATRIC BOTTLE 3CC  Final   Culture   Final    NO GROWTH 1 DAY Performed at [facility]    Report Status PENDING  Incomplete  Culture, blood (routine x 2)     Status: None (Preliminary result)   Collection Time: 09/21/15  8:05 AM  Result Value Ref Range Status   Specimen Description BLOOD LEFT ANTECUBITAL  Final   Special Requests BOTTLES DRAWN AEROBIC AND ANAEROBIC 10CC  Final   Culture   Final    NO  GROWTH 1 DAY Performed at [facility]    Report Status PENDING  Incomplete         Radiology Studies: No results found.      Scheduled Meds: . antiseptic oral rinse  7 mL Mouth Rinse BID  . cefTRIAXone (ROCEPHIN)  IV  2 g Intravenous Q24H  . diltiazem  180 mg Oral Daily  . furosemide  20 mg Intravenous BID  . levothyroxine  125 mcg Oral QAC breakfast  . magnesium sulfate 1 - 4 g bolus IVPB  2 g Intravenous Once  . pantoprazole  40 mg Oral Q1200  . phosphorus  500 mg Oral TID  . potassium chloride  60 mEq Oral TID  . sodium chloride flush  3 mL Intravenous Q12H  . warfarin  2 mg Oral ONCE-1800  . Warfarin - Pharmacist Dosing Inpatient   Does not apply q1800   Continuous Infusions:     LOS: 4 days    Time spent: 56min    Domenic Polite, MD [facility] Pager [phone]  If 7PM-7AM, please contact night-coverage [email or web address] Password [email or web address] 09/22/2015, 11:24 AM

## 2015-09-22 NOTE — Progress Notes (Signed)
ANTICOAGULATION CONSULT NOTE - Follow Up  Pharmacy Consult for Warfarin Indication: atrial fibrillation  Allergies  Allergen Reactions  . Demerol Nausea And Vomiting  . Keflet [Cephalexin] Nausea And Vomiting  . Sulfa Antibiotics Nausea And Vomiting  . Tequin Nausea Only  . Penicillins Rash    Patient Measurements: Height: 5\' 4"  (162.6 cm) Weight: 149 lb 11.1 oz (67.9 kg) IBW/kg (Calculated) : 54.7  Vital Signs: Temp: 98.9 F (37.2 C) (05/20 0800) Temp Source: Oral (05/20 0800) BP: 135/82 mmHg (05/20 0800) Pulse Rate: 78 (05/20 0800)  Labs:  Recent Labs  09/19/15 1303 09/19/15 1951  09/20/15 0322 09/21/15 0313 09/22/15 0316  HGB  --   --   < > 11.9* 11.7* 12.3  HCT  --   --   --  35.9* 33.5* 36.7  PLT  --   --   --  81* 63* 74*  LABPROT  --   --   --  39.4* 35.2* 28.0*  INR  --   --   --  4.20* 3.76* 2.77*  CREATININE  --   --   --  0.73 0.68 0.60  TROPONINI 0.48* 0.50*  --   --   --   --   < > = values in this interval not displayed.  Estimated Creatinine Clearance: 45.2 mL/min (by C-G formula based on Cr of 0.6).   Medical History: Past Medical History  Diagnosis Date  . GERD (gastroesophageal reflux disease)   . Hypertension   . Hypothyroidism   . Cancer [facility])     left breast  . Hx of radiation therapy 2001    left breast  . A-fib ([place])   . Constipation   . HLD (hyperlipidemia)   . GERD (gastroesophageal reflux disease)     Assessment: 29 yoF on warfarin PTA for atrial fibrillation, admitted with septic shock.  Warfarin initially held on admission due to AMS.  Pharmacy consulted to resume 5/17.  PTA warfarin dose documented as: 2.5 mg daily except takes 5 mg on MWF, last dose 5/16 1600 and INR was therapeutic on admission at 2.63.  Today, 09/22/2015: - INR supratherapeutic until today (INR = 2.77).  No warfarin given this admission yet. - Hgb stable, Platelets low - improved this am. - Poor appetite - No documented bleeding - No major drug  interactions   Goal of Therapy:  INR 2-3   Plan:   Warfarin 2 mg PO x 1 tonight - reinitiating at lower dose for recent INR > goal  Daily INR.  Watch platelet count closely  When appropriate to change to PO antibiotics, Suggest avoiding quinolones d/t possible interaction with warfarin (suggest cefuroxime 500mg  BID)  Doreene Eland, PharmD, BCPS.   Pager: [phone] 09/22/2015 9:06 AM

## 2015-09-23 LAB — CBC
HEMATOCRIT: 39.1 % (ref 36.0–46.0)
Hemoglobin: 13.1 g/dL (ref 12.0–15.0)
MCH: 29.9 pg (ref 26.0–34.0)
MCHC: 33.5 g/dL (ref 30.0–36.0)
MCV: 89.3 fL (ref 78.0–100.0)
Platelets: 86 10*3/uL — ABNORMAL LOW (ref 150–400)
RBC: 4.38 MIL/uL (ref 3.87–5.11)
RDW: 15 % (ref 11.5–15.5)
WBC: 8.7 10*3/uL (ref 4.0–10.5)

## 2015-09-23 LAB — BASIC METABOLIC PANEL
ANION GAP: 10 (ref 5–15)
BUN: 19 mg/dL (ref 6–20)
CALCIUM: 9.2 mg/dL (ref 8.9–10.3)
CO2: 25 mmol/L (ref 22–32)
Chloride: 105 mmol/L (ref 101–111)
Creatinine, Ser: 0.57 mg/dL (ref 0.44–1.00)
GFR calc Af Amer: >60 mL/min (ref >60)
GFR calc non Af Amer: >60 mL/min (ref >60)
GLUCOSE: 90 mg/dL (ref 65–99)
POTASSIUM: 3.1 mmol/L — AB (ref 3.5–5.1)
Sodium: 140 mmol/L (ref 135–145)

## 2015-09-23 LAB — GLUCOSE, CAPILLARY: Glucose-Capillary: 93 mg/dL (ref 65–99)

## 2015-09-23 LAB — PROTIME-INR
INR: 2.41 — ABNORMAL HIGH (ref 0.00–1.49)
Prothrombin Time: 25.2 seconds — ABNORMAL HIGH (ref 11.6–15.2)

## 2015-09-23 MED ORDER — POTASSIUM CHLORIDE CRYS ER 20 MEQ PO TBCR
40.0000 meq | EXTENDED_RELEASE_TABLET | Freq: Two times a day (BID) | ORAL | Status: AC
  Administered 2015-09-23 (×2): 40 meq via ORAL
  Filled 2015-09-23 (×2): qty 2

## 2015-09-23 MED ORDER — CEFUROXIME AXETIL 500 MG PO TABS
500.0000 mg | ORAL_TABLET | Freq: Two times a day (BID) | ORAL | Status: DC
Start: 2015-09-23 — End: 2015-09-24
  Administered 2015-09-23 – 2015-09-24 (×2): 500 mg via ORAL
  Filled 2015-09-23 (×4): qty 1

## 2015-09-23 MED ORDER — CEPHALEXIN 500 MG PO CAPS
500.0000 mg | ORAL_CAPSULE | Freq: Three times a day (TID) | ORAL | Status: DC
  Filled 2015-09-23 (×2): qty 1

## 2015-09-23 MED ORDER — WARFARIN SODIUM 2.5 MG PO TABS
2.5000 mg | ORAL_TABLET | Freq: Once | ORAL | Status: AC
  Administered 2015-09-23: 2.5 mg via ORAL
  Filled 2015-09-23: qty 1

## 2015-09-23 MED ORDER — FUROSEMIDE 10 MG/ML IJ SOLN
40.0000 mg | Freq: Once | INTRAMUSCULAR | Status: AC
  Administered 2015-09-23: 40 mg via INTRAVENOUS

## 2015-09-23 NOTE — Progress Notes (Signed)
PROGRESS NOTE    Shannon [name]  [id number] DOB: January 16, 1925 DOA: 09/18/2015 PCP: [name], MD  Brief Narrative: Shannon [name] is a 82 y.o. female from SNF with medical history significant of atrial fibrillation on Coumadin, hypertension, hyperlipidemia, GERD, hypothyroidism, depression, left breast cancer 2011 (s/p of XRT and surgery per her son), who presented with fever, nausea, vomiting and altered mental status. ED Course: pt was found to have positive urinalysis for UTI, lactate 2.07-->7.4, WBC 11.4, INR 2.63, temperature 103.5, tachycardia, tachypnea, oxygen saturation 94%, potassium 3.4, creatinine 1.01. Chest x-ray showed pulmonary vascular congestion, but no infiltration. Initially bp was 103/63, but dropped to 88/46 later. Started on pressors  Assessment & Plan:  Septic shock due to Klebsiella UTI  -s/p 4.5L NS and then started on Neo synephrine, off pressors since 11am 5/17 -Blood Cx with Klebsiella and enterobacter species on PCR, urine cx with Klebsiella -repeat Blood cx-NGTD -Stopped Vanco and cefepime 5/18 and changed to IV ceftriaxone, sensitivities noted -sepsis physiology improved -change to Oral Keflex today since repeat Blood Cx  negative  Acute encephalopathy: Most likely due to UTI and sepsis -has underlying dementia too, improved  Volume overload -iatrogenic from fluid resuscitation -continue IV lasix today, cut down dose to 20mg , improving well  Hypothyroidism: TSH was 1.110on 12/06/09. -resumed PO synthroid  Atrial Fibrillation with RVR - CHA2DS2-VASc Score is 4, needs oral anticoagulation. Patient is on Coumadin. INR is 2.63 on admission. -HR improved today -was on cardizem gtt, now off -continue PO Cardizem, coumadin  HTN:  -continue  cardizem  GERD: -PPI  Hypokalemia: -Replete  HLD: Last LDL was 73 on 12/07/15  DVT ppx: warfarin  Ambulate, PT/OT eval  Code Status: DNR Family Communication:none at bedside, d/w daughter in law   5/18 Disposition Plan:SNF in 1-2days   Consultants:   PCCM   Procedures:   Antimicrobials:  Subjective: " feels better, some dyspnea", no event overnight  Objective: Filed Vitals:   09/22/15 1500 09/22/15 1618 09/22/15 2025 09/23/15 0501  BP: 126/65 109/56 114/64 122/69  Pulse: 74 72 77 79  Temp:  97.1 F (36.2 C) 98.3 F (36.8 C) 98.9 F (37.2 C)  TempSrc:  Oral Oral Oral  Resp: 19 18 20 21   Height:  5\' 3"  (1.6 m)    Weight:  69.4 kg (153 lb)    SpO2: 96% 100% 96% 95%    Intake/Output Summary (Last 24 hours) at 09/23/15 1218 Last data filed at 09/23/15 0900  Gross per 24 hour  Intake    840 ml  Output      0 ml  Net    840 ml   Filed Weights   09/18/15 2125 09/19/15 0100 09/22/15 1618  Weight: 63.504 kg (140 lb) 67.9 kg (149 lb 11.1 oz) 69.4 kg (153 lb)    Examination:  General exam: Appears calm and comfortable, no distress Respiratory system: crackles in both lower lungs Cardiovascular system: S1 & S2 heard, RRR. No JVD, murmurs, rubs, gallops or clicks. No pedal edema. Gastrointestinal system: Abdomen is nondistended, soft and nontender. No organomegaly or masses felt. Normal bowel sounds  Central nervous system: Alert and oriented. No focal neurological deficits. Extremities: Symmetric 5 x 5 power. Skin: No rashes, lesions or ulcers Psychiatry: Judgement and insight appear normal. Mood & affect appropriate.     Data Reviewed: I have personally reviewed following labs and imaging studies  CBC:  Recent Labs Lab 09/18/15 2141  09/19/15 [id number] 09/20/15 [id number] 09/21/15 [id number] 09/22/15 0316 09/23/15 0550  WBC 11.4*  --  16.6* 23.1* 23.3* 14.9* 8.7  NEUTROABS 10.8*  --   --   --   --   --   --   HGB 12.8  < > 11.5* 11.9* 11.7* 12.3 13.1  HCT 38.4  < > 34.5* 35.9* 33.5* 36.7 39.1  MCV 91.2  --  91.3 91.3 87.5 89.7 89.3  PLT 160  --  111* 81* 63* 74* 86*  < > = values in this interval not displayed. Basic Metabolic Panel:  Recent Labs Lab  09/19/15 0321 09/19/15 [id number] 09/20/15 0322 09/21/15 0313 09/22/15 0316 09/23/15 0550  NA  --  142 141 141 139 140  K  --  3.0* 3.2* 3.1* 2.9* 3.1*  CL  --  118* 115* 112* 105 105  CO2  --  17* 20* 23 25 25   GLUCOSE  --  112* 92 101* 83 90  BUN  --  16 16 25* 22* 19  CREATININE  --  1.06* 0.73 0.68 0.60 0.57  CALCIUM  --  8.3* 8.9 9.3 9.2 9.2  MG 1.3* 1.4* 2.0 2.1 1.7  --   PHOS  --  2.0* 2.9 1.4* 1.1*  --    GFR: Estimated Creatinine Clearance: 44.6 mL/min (by C-G formula based on Cr of 0.57). Liver Function Tests:  Recent Labs Lab 09/18/15 2141 09/19/15 0619  AST 78* 53*  ALT 49 37  ALKPHOS 79 50  BILITOT 0.9 0.8  PROT 5.8* 4.8*  ALBUMIN 3.4* 2.6*   No results for input(s): LIPASE, AMYLASE in the last 168 hours. No results for input(s): AMMONIA in the last 168 hours. Coagulation Profile:  Recent Labs Lab 09/19/15 0619 09/20/15 0322 09/21/15 0313 09/22/15 0316 09/23/15 0550  INR 3.23* 4.20* 3.76* 2.77* 2.41*   Cardiac Enzymes:  Recent Labs Lab 09/19/15 0321 09/19/15 1303 09/19/15 1951  TROPONINI 0.43* 0.48* 0.50*   BNP (last 3 results) No results for input(s): PROBNP in the last 8760 hours. HbA1C: No results for input(s): HGBA1C in the last 72 hours. CBG:  Recent Labs Lab 09/19/15 0747 09/20/15 0745 09/21/15 0730 09/22/15 0754 09/23/15 0723  GLUCAP 103* 82 93 113* 93   Lipid Profile: No results for input(s): CHOL, HDL, LDLCALC, TRIG, CHOLHDL, LDLDIRECT in the last 72 hours. Thyroid Function Tests: No results for input(s): TSH, T4TOTAL, FREET4, T3FREE, THYROIDAB in the last 72 hours. Anemia Panel: No results for input(s): VITAMINB12, FOLATE, FERRITIN, TIBC, IRON, RETICCTPCT in the last 72 hours. Urine analysis:    Component Value Date/Time   COLORURINE YELLOW 09/18/2015 2202   APPEARANCEUR CLOUDY* 09/18/2015 2202   LABSPEC 1.017 09/18/2015 2202   PHURINE 7.0 09/18/2015 2202   GLUCOSEU NEGATIVE 09/18/2015 2202   HGBUR MODERATE*  09/18/2015 2202   BILIRUBINUR NEGATIVE 09/18/2015 2202   KETONESUR NEGATIVE 09/18/2015 2202   PROTEINUR NEGATIVE 09/18/2015 2202   UROBILINOGEN 0.2 09/23/2014 2201   NITRITE POSITIVE* 09/18/2015 2202   LEUKOCYTESUR MODERATE* 09/18/2015 2202   Sepsis Labs: @LABRCNTIP (procalcitonin:4,lacticidven:4)  ) Recent Results (from the past 240 hour(s))  Culture, blood (routine x 2)     Status: Abnormal   Collection Time: 09/18/15  9:41 PM  Result Value Ref Range Status   Specimen Description BLOOD RIGHT HAND  Final   Special Requests BOTTLES DRAWN AEROBIC AND ANAEROBIC 5CC  Final   Culture  Setup Time   Final    GRAM NEGATIVE RODS IN BOTH AEROBIC AND ANAEROBIC BOTTLES Organism ID to follow CRITICAL RESULT CALLED TO, READ BACK BY AND  VERIFIED WITH: Merrilee Seashore AT 1120 ON [id number] BY Ronnie Derby Performed at [place] (A)  Final   Report Status 09/21/2015 FINAL  Final   Organism ID, Bacteria KLEBSIELLA PNEUMONIAE  Final      Susceptibility   Klebsiella pneumoniae - MIC*    AMPICILLIN 16 RESISTANT Resistant     CEFAZOLIN <=4 SENSITIVE Sensitive     CEFEPIME <=1 SENSITIVE Sensitive     CEFTAZIDIME <=1 SENSITIVE Sensitive     CEFTRIAXONE <=1 SENSITIVE Sensitive     CIPROFLOXACIN <=0.25 SENSITIVE Sensitive     GENTAMICIN <=1 SENSITIVE Sensitive     IMIPENEM <=0.25 SENSITIVE Sensitive     TRIMETH/SULFA >=320 RESISTANT Resistant     AMPICILLIN/SULBACTAM 4 SENSITIVE Sensitive     PIP/TAZO <=4 SENSITIVE Sensitive     * KLEBSIELLA PNEUMONIAE  Culture, blood (routine x 2)     Status: Abnormal   Collection Time: 09/18/15  9:41 PM  Result Value Ref Range Status   Specimen Description BLOOD RIGHT ANTECUBITAL  Final   Special Requests BOTTLES DRAWN AEROBIC AND ANAEROBIC 10CC  Final   Culture  Setup Time   Final    GRAM NEGATIVE RODS IN BOTH AEROBIC AND ANAEROBIC BOTTLES CRITICAL RESULT CALLED TO, READ BACK BY AND VERIFIED WITH: E. JACKSON, PHARM D  AT 1120 ON [id number] BY M. WILSON    Culture (A)  Final    KLEBSIELLA PNEUMONIAE SUSCEPTIBILITIES PERFORMED ON PREVIOUS CULTURE WITHIN THE LAST 5 DAYS. Performed at [facility]    Report Status 09/21/2015 FINAL  Final  Blood Culture ID Panel (Reflexed)     Status: Abnormal   Collection Time: 09/18/15  9:41 PM  Result Value Ref Range Status   Enterococcus species NOT DETECTED NOT DETECTED Final   Vancomycin resistance NOT DETECTED NOT DETECTED Final   Listeria monocytogenes NOT DETECTED NOT DETECTED Final   Staphylococcus species NOT DETECTED NOT DETECTED Final   Staphylococcus aureus NOT DETECTED NOT DETECTED Final   Methicillin resistance NOT DETECTED NOT DETECTED Final   Streptococcus species NOT DETECTED NOT DETECTED Final   Streptococcus agalactiae NOT DETECTED NOT DETECTED Final   Streptococcus pneumoniae NOT DETECTED NOT DETECTED Final   Streptococcus pyogenes NOT DETECTED NOT DETECTED Final   Acinetobacter baumannii NOT DETECTED NOT DETECTED Final   Enterobacteriaceae species DETECTED (A) NOT DETECTED Final    Comment: CRITICAL RESULT CALLED TO, READ BACK BY AND VERIFIED WITH: EBurman Foster.D. 11:20 09/19/15 ([email or web address])    Enterobacter cloacae complex NOT DETECTED NOT DETECTED Final   Escherichia coli NOT DETECTED NOT DETECTED Final   Klebsiella oxytoca NOT DETECTED NOT DETECTED Final   Klebsiella pneumoniae DETECTED (A) NOT DETECTED Final    Comment: CRITICAL RESULT CALLED TO, READ BACK BY AND VERIFIED WITH: EBurman Foster.D. 11:20 09/19/15 ([email or web address])    Proteus species NOT DETECTED NOT DETECTED Final   Serratia marcescens NOT DETECTED NOT DETECTED Final   Carbapenem resistance NOT DETECTED NOT DETECTED Final   Haemophilus influenzae NOT DETECTED NOT DETECTED Final   Neisseria meningitidis NOT DETECTED NOT DETECTED Final   Pseudomonas aeruginosa NOT DETECTED NOT DETECTED Final   Candida albicans NOT DETECTED NOT DETECTED Final   Candida glabrata NOT DETECTED NOT  DETECTED Final   Candida krusei NOT DETECTED NOT DETECTED Final   Candida parapsilosis NOT DETECTED NOT DETECTED Final   Candida tropicalis NOT DETECTED NOT DETECTED Final  Urine culture     Status: Abnormal   Collection  Time: 09/18/15 10:02 PM  Result Value Ref Range Status   Specimen Description URINE, CATHETERIZED  Final   Special Requests NONE  Final   Culture >=100,000 COLONIES/mL KLEBSIELLA PNEUMONIAE (A)  Final   Report Status 09/22/2015 FINAL  Final   Organism ID, Bacteria KLEBSIELLA PNEUMONIAE (A)  Final      Susceptibility   Klebsiella pneumoniae - MIC*    AMPICILLIN 16 RESISTANT Resistant     CEFAZOLIN <=4 SENSITIVE Sensitive     CEFTRIAXONE <=1 SENSITIVE Sensitive     CIPROFLOXACIN <=0.25 SENSITIVE Sensitive     GENTAMICIN <=1 SENSITIVE Sensitive     IMIPENEM <=0.25 SENSITIVE Sensitive     NITROFURANTOIN 64 INTERMEDIATE Intermediate     TRIMETH/SULFA >=320 RESISTANT Resistant     AMPICILLIN/SULBACTAM 4 SENSITIVE Sensitive     PIP/TAZO <=4 SENSITIVE Sensitive     * >=100,000 COLONIES/mL KLEBSIELLA PNEUMONIAE  MRSA PCR Screening     Status: None   Collection Time: 09/19/15  4:59 AM  Result Value Ref Range Status   MRSA by PCR NEGATIVE NEGATIVE Final    Comment:        The GeneXpert MRSA Assay (FDA approved for NASAL specimens only), is one component of a comprehensive MRSA colonization surveillance program. It is not intended to diagnose MRSA infection nor to guide or monitor treatment for MRSA infections.   Culture, blood (routine x 2)     Status: None (Preliminary result)   Collection Time: 09/21/15  8:00 AM  Result Value Ref Range Status   Specimen Description BLOOD LEFT HAND  Final   Special Requests IN PEDIATRIC BOTTLE 3CC  Final   Culture   Final    NO GROWTH 2 DAYS Performed at [facility]    Report Status PENDING  Incomplete  Culture, blood (routine x 2)     Status: None (Preliminary result)   Collection Time: 09/21/15  8:05 AM  Result  Value Ref Range Status   Specimen Description BLOOD LEFT ANTECUBITAL  Final   Special Requests BOTTLES DRAWN AEROBIC AND ANAEROBIC 10CC  Final   Culture   Final    NO GROWTH 2 DAYS Performed at [facility]    Report Status PENDING  Incomplete         Radiology Studies: No results found.      Scheduled Meds: . antiseptic oral rinse  7 mL Mouth Rinse BID  . cefTRIAXone (ROCEPHIN)  IV  2 g Intravenous Q24H  . diltiazem  180 mg Oral Daily  . levothyroxine  125 mcg Oral QAC breakfast  . pantoprazole  40 mg Oral Q1200  . potassium chloride  40 mEq Oral BID  . sodium chloride flush  3 mL Intravenous Q12H  . Warfarin - Pharmacist Dosing Inpatient   Does not apply q1800   Continuous Infusions:     LOS: 5 days    Time spent: 13min    Domenic Polite, MD [facility] Pager [phone]  If 7PM-7AM, please contact night-coverage [email or web address] Password [facility] 09/23/2015, 12:18 PM

## 2015-09-23 NOTE — Progress Notes (Addendum)
ANTICOAGULATION CONSULT NOTE - Follow Up  Pharmacy Consult for Warfarin Indication: atrial fibrillation  Allergies  Allergen Reactions  . Demerol Nausea And Vomiting  . Keflet [Cephalexin] Nausea And Vomiting  . Sulfa Antibiotics Nausea And Vomiting  . Tequin Nausea Only  . Penicillins Rash    Patient Measurements: Height: 5\' 3"  (160 cm) Weight: 153 lb (69.4 kg) IBW/kg (Calculated) : 52.4  Vital Signs: Temp: 98.9 F (37.2 C) (05/21 0501) Temp Source: Oral (05/21 0501) BP: 122/69 mmHg (05/21 0501) Pulse Rate: 79 (05/21 0501)  Labs:  Recent Labs  09/21/15 0313 09/22/15 0316 09/23/15 0550  HGB 11.7* 12.3 13.1  HCT 33.5* 36.7 39.1  PLT 63* 74* 86*  LABPROT 35.2* 28.0* 25.2*  INR 3.76* 2.77* 2.41*  CREATININE 0.68 0.60 0.57    Estimated Creatinine Clearance: 44.6 mL/min (by C-G formula based on Cr of 0.57).   Medical History: Past Medical History  Diagnosis Date  . GERD (gastroesophageal reflux disease)   . Hypertension   . Hypothyroidism   . Cancer [facility])     left breast  . Hx of radiation therapy 2001    left breast  . A-fib ([place])   . Constipation   . HLD (hyperlipidemia)   . GERD (gastroesophageal reflux disease)     Assessment: 63 yoF on warfarin PTA for atrial fibrillation, admitted with septic shock.  Warfarin initially held on admission due to AMS.  Pharmacy consulted to resume 5/17.  PTA warfarin dose documented as: 2.5 mg daily except takes 5 mg on MWF, last dose 5/16 1600 and INR was therapeutic on admission at 2.63.  Today, 09/23/2015: - INR  Is 2.41.  Pt received dose of 2 mg last night - Hgb stable, Platelets low - improved this am. - Poor appetite, 35% meals eaten - No documented bleeding - No major drug interactions   Goal of Therapy:  INR 2-3   Plan:   Warfarin 2.5 mg PO x 1 tonight    Daily INR.  Watch platelet count closely  Royetta Asal, PharmD, BCPS Pager [phone] 09/23/2015 12:37 PM

## 2015-09-23 NOTE — NC FL2 (Signed)
[place] LEVEL OF CARE SCREENING TOOL     IDENTIFICATION  Patient Name: Shannon [name] Birthdate: 08/25/1924 Sex: female Admission Date (Current Location): 09/18/2015  [facility] and [place] Number:  Herbalist and Address:  [facility],  [place] [address] [place]      Provider Number: [id number]  Attending Physician Name and Address:  Domenic Polite, MD  Relative Name and Phone Number:       Current Level of Care: Hospital Recommended Level of Care: Nursing Facility Prior Approval Number:    Date Approved/Denied:   PASRR Number: [id number]  Discharge Plan: SNF    Current Diagnoses: Patient Active Problem List   Diagnosis Date Noted  . Sepsis ([place]) 09/19/2015  . Hypokalemia 09/19/2015  . UTI (lower urinary tract infection) 09/19/2015  . Acute encephalopathy 09/19/2015  . HLD (hyperlipidemia)   . GERD (gastroesophageal reflux disease)   . Gastroesophageal reflux disease without esophagitis   . Closed right hip fracture ([place]) 09/24/2014  . Fracture, intertrochanteric, right femur ([place]) 09/23/2014  . Long term (current) use of anticoagulants 11/30/2012  . Cancer ([place])   . Cancer of breast, intraductal 12/01/2011  . UTI (urinary tract infection) 07/04/2011  . SDH (subdural hematoma) ([place]) 06/30/2011  . Occipital fracture ([place]) 06/30/2011  . Fall 06/30/2011  . A-fib ([place]) 06/30/2011  . Warfarin-induced coagulopathy ([place]) 06/30/2011  . Hypothyroidism 06/30/2011    Orientation RESPIRATION BLADDER Height & Weight     Self, Place  Normal Incontinent Weight: 153 lb (69.4 kg) Height:  5\' 3"  (160 cm)  BEHAVIORAL SYMPTOMS/MOOD NEUROLOGICAL BOWEL NUTRITION STATUS      Incontinent Diet  AMBULATORY STATUS COMMUNICATION OF NEEDS Skin   Extensive Assist Verbally Normal                       Personal Care Assistance Level of Assistance  Bathing, Feeding, Dressing Bathing Assistance: Limited assistance Feeding  assistance: Limited assistance Dressing Assistance: Limited assistance     Functional Limitations Info  Sight, Hearing, Speech Sight Info: Impaired Hearing Info: Adequate Speech Info: Adequate    SPECIAL CARE FACTORS FREQUENCY                       Contractures Contractures Info: Not present    Additional Factors Info  Code Status, Allergies Code Status Info: DNR Allergies Info: Demerol, Keflet, Sulfa Antibiotics, Tequin, Penicillins           Current Medications (09/23/2015):  This is the current hospital active medication list Current Facility-Administered Medications  Medication Dose Route Frequency Provider Last Rate Last Dose  . acetaminophen (TYLENOL) tablet 650 mg  650 mg Oral Q6H PRN Ivor Costa, MD   650 mg at 09/20/15 [id number]   Or  . acetaminophen (TYLENOL) suppository 650 mg  650 mg Rectal Q6H PRN Ivor Costa, MD   650 mg at 09/19/15 0442  . antiseptic oral rinse (CPC / CETYLPYRIDINIUM CHLORIDE 0.05%) solution 7 mL  7 mL Mouth Rinse BID Domenic Polite, MD   7 mL at 09/23/15 1000  . cefUROXime (CEFTIN) tablet 500 mg  500 mg Oral BID WC Domenic Polite, MD      . diltiazem (CARDIZEM CD) 24 hr capsule 180 mg  180 mg Oral Daily Domenic Polite, MD   180 mg at 09/23/15 1133  . levothyroxine (SYNTHROID, LEVOTHROID) tablet 125 mcg  125 mcg Oral QAC breakfast Domenic Polite, MD   125 mcg at 09/23/15  1134  . LORazepam (ATIVAN) injection 0.5 mg  0.5 mg Intravenous Q6H PRN Domenic Polite, MD   0.5 mg at 09/19/15 2238  . metoprolol (LOPRESSOR) injection 5 mg  5 mg Intravenous Q3H PRN Domenic Polite, MD      . ondansetron [facility]) tablet 4 mg  4 mg Oral Q6H PRN Ivor Costa, MD       Or  . ondansetron [facility]) injection 4 mg  4 mg Intravenous Q6H PRN Ivor Costa, MD      . pantoprazole (PROTONIX) EC tablet 40 mg  40 mg Oral Q1200 Domenic Polite, MD   40 mg at 09/23/15 1133  . potassium chloride SA (K-DUR,KLOR-CON) CR tablet 40 mEq  40 mEq Oral BID Domenic Polite, MD   40 mEq at  09/23/15 1131  . sodium chloride flush (NS) 0.9 % injection 3 mL  3 mL Intravenous Q12H Ivor Costa, MD   3 mL at 09/23/15 1134  . warfarin (COUMADIN) tablet 2.5 mg  2.5 mg Oral ONCE-1800 Royetta Asal, RPH      . Warfarin - Pharmacist Dosing Inpatient   Does not apply Wilder, [place] at 09/19/15 1800     Discharge Medications: Please see discharge summary for a list of discharge medications.  Relevant Imaging Results:  Relevant Lab Results:   Additional Information    Lilly [place], LCSW

## 2015-09-24 LAB — CBC
HEMATOCRIT: 35.7 % — AB (ref 36.0–46.0)
Hemoglobin: 12.2 g/dL (ref 12.0–15.0)
MCH: 30.4 pg (ref 26.0–34.0)
MCHC: 34.2 g/dL (ref 30.0–36.0)
MCV: 89 fL (ref 78.0–100.0)
Platelets: 111 10*3/uL — ABNORMAL LOW (ref 150–400)
RBC: 4.01 MIL/uL (ref 3.87–5.11)
RDW: 15 % (ref 11.5–15.5)
WBC: 9 10*3/uL (ref 4.0–10.5)

## 2015-09-24 LAB — BASIC METABOLIC PANEL
Anion gap: 7 (ref 5–15)
BUN: 18 mg/dL (ref 6–20)
CALCIUM: 9.2 mg/dL (ref 8.9–10.3)
CHLORIDE: 103 mmol/L (ref 101–111)
CO2: 27 mmol/L (ref 22–32)
CREATININE: 0.59 mg/dL (ref 0.44–1.00)
GFR calc Af Amer: >60 mL/min (ref >60)
GFR calc non Af Amer: >60 mL/min (ref >60)
GLUCOSE: 108 mg/dL — AB (ref 65–99)
Potassium: 3 mmol/L — ABNORMAL LOW (ref 3.5–5.1)
Sodium: 137 mmol/L (ref 135–145)

## 2015-09-24 LAB — GLUCOSE, CAPILLARY: Glucose-Capillary: 108 mg/dL — ABNORMAL HIGH (ref 65–99)

## 2015-09-24 LAB — PROTIME-INR
INR: 2.18 — ABNORMAL HIGH (ref 0.00–1.49)
Prothrombin Time: 23.4 seconds — ABNORMAL HIGH (ref 11.6–15.2)

## 2015-09-24 MED ORDER — WARFARIN SODIUM 5 MG PO TABS
5.0000 mg | ORAL_TABLET | Freq: Once | ORAL | Status: DC
  Filled 2015-09-24: qty 1

## 2015-09-24 MED ORDER — POTASSIUM CHLORIDE CRYS ER 20 MEQ PO TBCR
40.0000 meq | EXTENDED_RELEASE_TABLET | Freq: Two times a day (BID) | ORAL | Status: DC
  Administered 2015-09-24: 40 meq via ORAL
  Filled 2015-09-24: qty 2

## 2015-09-24 MED ORDER — CEFUROXIME AXETIL 500 MG PO TABS: 500.0000 mg | ORAL_TABLET | Freq: Two times a day (BID) | ORAL | Status: DC

## 2015-09-24 NOTE — Care Management Important Message (Signed)
Important Message  Patient Details  Name: Shannon [name] MRN: [id number] Date of Birth: Aug 13, 1924   Medicare Important Message Given:  Yes    Camillo Flaming 09/24/2015, 10:41 AMImportant Message  Patient Details  Name: Shannon [name] MRN: [id number] Date of Birth: Jun 30, 1924   Medicare Important Message Given:  Yes    Camillo Flaming 09/24/2015, 10:41 AM

## 2015-09-24 NOTE — Progress Notes (Signed)
ANTICOAGULATION CONSULT NOTE - Follow Up  Pharmacy Consult for Warfarin Indication: atrial fibrillation  Allergies  Allergen Reactions  . Demerol Nausea And Vomiting  . Keflet [Cephalexin] Nausea And Vomiting  . Sulfa Antibiotics Nausea And Vomiting  . Tequin Nausea Only  . Penicillins Rash    Patient Measurements: Height: 5\' 3"  (160 cm) Weight: 153 lb (69.4 kg) IBW/kg (Calculated) : 52.4  Vital Signs: Temp: 97.7 F (36.5 C) (05/22 0615) Temp Source: Oral (05/22 0615) BP: 130/62 mmHg (05/22 0615) Pulse Rate: 78 (05/22 0615)  Labs:  Recent Labs  09/22/15 0316 09/23/15 0550 09/24/15 0529  HGB 12.3 13.1 12.2  HCT 36.7 39.1 35.7*  PLT 74* 86* 111*  LABPROT 28.0* 25.2* 23.4*  INR 2.77* 2.41* 2.18*  CREATININE 0.60 0.57 0.59    Estimated Creatinine Clearance: 44.6 mL/min (by C-G formula based on Cr of 0.59).   Medical History: Past Medical History  Diagnosis Date  . GERD (gastroesophageal reflux disease)   . Hypertension   . Hypothyroidism   . Cancer [facility])     left breast  . Hx of radiation therapy 2001    left breast  . A-fib ([place])   . Constipation   . HLD (hyperlipidemia)   . GERD (gastroesophageal reflux disease)     Assessment: 3 yoF on warfarin PTA for atrial fibrillation, admitted with septic shock.  Warfarin initially held on admission due to AMS.  Pharmacy consulted to resume 5/17.  PTA warfarin dose documented as: 2.5 mg daily except takes 5 mg on MWF, last dose 5/16 1600 and INR was therapeutic on admission at 2.63.  Today, 09/24/2015: - INR therapeutic at 2.18. - Hgb relatively stable, Platelets low but improving. - Poor appetite, 25-35% meals eaten - No documented bleeding - No major drug interactions   Goal of Therapy:  INR 2-3   Plan:   Warfarin 5 mg PO x 1 tonight    Daily INR  Hershal Coria, PharmD, BCPS Pager: [phone] 09/24/2015 9:52 AM

## 2015-09-24 NOTE — Progress Notes (Signed)
Patient returning to [facility]. Notified facility and family of DC. Sent clinicals to the facility. Placed DNR for MD. To sign. Contacted Son-Kyle informed about DC with [facility].    Kathrin Greathouse, Latanya Presser, MSW Clinical Social Worker 5E and Psychiatric Service Line [phone] 09/24/2015  1:03 PM

## 2015-09-24 NOTE — Discharge Summary (Addendum)
Physician Discharge Summary  Shannon [name] [id number] DOB: 1925-04-28 DOA: 09/18/2015  PCP: [name], MD  Admit date: 09/18/2015 Discharge date: 09/24/2015  Time spent: 45 minutes  Recommendations for Outpatient Follow-up:  1. PCP Dr.Gates in 1 week 2.  Monitor INR: goal 2-3 on Coumadin for P.Afib  Discharge Diagnoses:    Septic Shock   Klebsiella UTI (urinary tract infection)   Iatrogenic volume overload   Dementia   P. A-fibrillation   Hypothyroidism   Cancer ([place])   Sepsis ([place])   HLD (hyperlipidemia)   GERD (gastroesophageal reflux disease)   Hypokalemia   UTI (lower urinary tract infection)   Gastroesophageal reflux disease without esophagitis   Acute encephalopathy   Discharge Condition: stable  Diet recommendation:low sodium  Filed Weights   09/18/15 2125 09/19/15 0100 09/22/15 1618  Weight: 63.504 kg (140 lb) 67.9 kg (149 lb 11.1 oz) 69.4 kg (153 lb)    History of present illness:  Shannon [name] is a 82 y.o. female from SNF with medical history significant of atrial fibrillation on Coumadin, hypertension, hyperlipidemia, GERD, hypothyroidism, depression, left breast cancer 2011 (s/p of XRT and surgery per her son), who presented with fever, nausea, vomiting and altered mental status.  Hospital Course:  Septic shock due to Klebsiella UTI  -s/p 4.5L NS and then started on Neo synephrine, off pressors since 11am 5/17 -Blood Cultures and Urine Culture grew Klebsiella  -repeat Blood cx-NGTD -Stopped Vanco and cefepime 5/18 and changed to IV ceftriaxone based on sensitivities noted -sepsis physiology improved -changed Abx to Oral Ceftin yesterday, continue this for 6 more days, since clinically improved and bacteremia cleared -Being discharged to SNF for Rehab  Acute encephalopathy: Most likely due to UTI and sepsis -has underlying dementia too, improved  Volume overload -iatrogenic from fluid resuscitation -diuresed with IV lasix for 2days  after initial fluid resuscitation, now improved, continue PRN lasix as prior to admission  Hypothyroidism: TSH was 1.110on 12/06/09. -resumed PO synthroid  Atrial Fibrillation with RVR - CHA2DS2-VASc Score is 4, on Coumadin. INR is 2.63 on admission. -had AFib RVR -was on cardizem gtt, now off -resumed PO Cardizem, coumadin, now Stable and HR controlled  HTN:  -continue cardizem  GERD: -PPI  Hypokalemia: -due to diuretics, Repleted  Discharge Exam: Filed Vitals:   09/23/15 2118 09/24/15 0615  BP: 124/61 130/62  Pulse: 80 78  Temp: 98.9 F (37.2 C) 97.7 F (36.5 C)  Resp: 20 20    General: AAOx2 Cardiovascular: S1S2/RRR Respiratory: CTAB  Discharge Instructions   Discharge Instructions    Diet - low sodium heart healthy    Complete by:  As directed      Increase activity slowly    Complete by:  As directed           Current Discharge Medication List    START taking these medications   Details  cefUROXime (CEFTIN) 500 MG tablet Take 1 tablet (500 mg total) by mouth 2 (two) times daily with a meal. For 6days Qty: 12 tablet, Refills: 0      CONTINUE these medications which have NOT CHANGED   Details  atorvastatin (LIPITOR) 10 MG tablet Take 10 mg by mouth at bedtime.     Cholecalciferol (VITAMIN D) 2000 UNITS tablet Take 2,000 Units by mouth daily.    diltiazem (CARDIZEM CD) 180 MG 24 hr capsule Take 180 mg by mouth daily.     levothyroxine (SYNTHROID, LEVOTHROID) 125 MCG tablet Take 1 tablet (125 mcg total) by mouth  daily before breakfast.    MYRBETRIQ 50 MG TB24 Take 50 mg by mouth daily.     Nutritional Supplements (COMPLETE NUTRITION PO) Take 1 Can by mouth 3 (three) times daily.    pantoprazole (PROTONIX) 40 MG tablet Take 40 mg by mouth daily.    polyethylene glycol (MIRALAX / GLYCOLAX) packet Take 17 g by mouth daily. Qty: 14 each, Refills: 0    propafenone (RYTHMOL) 150 MG tablet Take 150 mg by mouth 3 (three) times daily.    sertraline  (ZOLOFT) 25 MG tablet Take 25 mg by mouth daily.     vitamin B-12 (CYANOCOBALAMIN) 1000 MCG tablet Take 1,000 mcg by mouth daily.    warfarin (COUMADIN) 5 MG tablet Take 5 mg by mouth as directed. Take 1 tablet(5 mg) on Mon, Wed, Fri    acetaminophen (TYLENOL) 500 MG tablet Take 2 tablets (1,000 mg total) by mouth 3 (three) times daily. For 7 days from 09/26/14 Qty: 30 tablet, Refills: 0    [employer] STOOL SOFTENER 100 MG capsule Take 1 capsule by mouth 2 (two) times daily as needed for moderate constipation.  Refills: 11    furosemide (LASIX) 20 MG tablet Take 20 mg by mouth daily as needed (leg swelling).     traMADol (ULTRAM) 50 MG tablet Take 1 tablet (50 mg total) by mouth every 6 (six) hours as needed for moderate pain. Qty: 30 tablet, Refills: 0    zolpidem (AMBIEN) 5 MG tablet Take 1 tablet (5 mg total) by mouth at bedtime as needed for sleep. Qty: 10 tablet, Refills: 0      STOP taking these medications     anti-nausea (EMETROL) solution      promethazine (PHENERGAN) 12.5 MG tablet      trimethoprim (TRIMPEX) 100 MG tablet      chlordiazePOXIDE (LIBRIUM) 5 MG capsule        Allergies  Allergen Reactions  . Demerol Nausea And Vomiting  . Keflet [Cephalexin] Nausea And Vomiting  . Sulfa Antibiotics Nausea And Vomiting  . Tequin Nausea Only  . Penicillins Rash   Follow-up Information    Follow up with GATES,ROBERT NEVILL, MD In 1 week.   Specialty:  Internal Medicine   Contact information:   [address] [place] [address] [phone]        The results of significant diagnostics from this hospitalization (including imaging, microbiology, ancillary and laboratory) are listed below for reference.    Significant Diagnostic Studies: Dg Chest Port 1 View  09/19/2015  CLINICAL DATA:  Hypoxia and wheezing today.  Initial encounter. EXAM: PORTABLE CHEST 1 VIEW COMPARISON:  Single-view of the chest 09/18/2015 and 09/23/2014. FINDINGS: There is a  small left pleural effusion with basilar airspace disease. The right lung is clear. Lung volumes are somewhat low. Heart size is upper normal. No pneumothorax. IMPRESSION: Small left pleural effusion and basilar airspace disease which could be atelectasis or pneumonia. Electronically Signed   By: Inge Rise M.D.   On: 09/19/2015 10:42   Dg Chest Portable 1 View  09/18/2015  CLINICAL DATA:  Fever and weakness. EXAM: PORTABLE CHEST 1 VIEW COMPARISON:  09/23/2014 FINDINGS: Shallow inspiration. Mild cardiac enlargement. Pulmonary vascularity is mildly prominent but there is no evidence of edema or consolidation. No blunting of costophrenic angles. No pneumothorax. Calcified and tortuous aorta. Degenerative changes in the spine and shoulders IMPRESSION: Cardiac enlargement with mild pulmonary vascular congestion. No consolidation or edema. Electronically Signed   By: Lucienne Capers  M.D.   On: 09/18/2015 21:59    Microbiology: Recent Results (from the past 240 hour(s))  Culture, blood (routine x 2)     Status: Abnormal   Collection Time: 09/18/15  9:41 PM  Result Value Ref Range Status   Specimen Description BLOOD RIGHT HAND  Final   Special Requests BOTTLES DRAWN AEROBIC AND ANAEROBIC 5CC  Final   Culture  Setup Time   Final    GRAM NEGATIVE RODS IN BOTH AEROBIC AND ANAEROBIC BOTTLES Organism ID to follow CRITICAL RESULT CALLED TO, READ BACK BY AND VERIFIED WITH: Moishe Spice D AT 1120 ON [id number] BY Ronnie Derby Performed at [place] (A)  Final   Report Status 09/21/2015 FINAL  Final   Organism ID, Bacteria KLEBSIELLA PNEUMONIAE  Final      Susceptibility   Klebsiella pneumoniae - MIC*    AMPICILLIN 16 RESISTANT Resistant     CEFAZOLIN <=4 SENSITIVE Sensitive     CEFEPIME <=1 SENSITIVE Sensitive     CEFTAZIDIME <=1 SENSITIVE Sensitive     CEFTRIAXONE <=1 SENSITIVE Sensitive     CIPROFLOXACIN <=0.25 SENSITIVE Sensitive     GENTAMICIN <=1  SENSITIVE Sensitive     IMIPENEM <=0.25 SENSITIVE Sensitive     TRIMETH/SULFA >=320 RESISTANT Resistant     AMPICILLIN/SULBACTAM 4 SENSITIVE Sensitive     PIP/TAZO <=4 SENSITIVE Sensitive     * KLEBSIELLA PNEUMONIAE  Culture, blood (routine x 2)     Status: Abnormal   Collection Time: 09/18/15  9:41 PM  Result Value Ref Range Status   Specimen Description BLOOD RIGHT ANTECUBITAL  Final   Special Requests BOTTLES DRAWN AEROBIC AND ANAEROBIC 10CC  Final   Culture  Setup Time   Final    GRAM NEGATIVE RODS IN BOTH AEROBIC AND ANAEROBIC BOTTLES CRITICAL RESULT CALLED TO, READ BACK BY AND VERIFIED WITH: E. JACKSON, PHARM D AT 1120 ON [id number] BY M. WILSON    Culture (A)  Final    KLEBSIELLA PNEUMONIAE SUSCEPTIBILITIES PERFORMED ON PREVIOUS CULTURE WITHIN THE LAST 5 DAYS. Performed at [facility]    Report Status 09/21/2015 FINAL  Final  Blood Culture ID Panel (Reflexed)     Status: Abnormal   Collection Time: 09/18/15  9:41 PM  Result Value Ref Range Status   Enterococcus species NOT DETECTED NOT DETECTED Final   Vancomycin resistance NOT DETECTED NOT DETECTED Final   Listeria monocytogenes NOT DETECTED NOT DETECTED Final   Staphylococcus species NOT DETECTED NOT DETECTED Final   Staphylococcus aureus NOT DETECTED NOT DETECTED Final   Methicillin resistance NOT DETECTED NOT DETECTED Final   Streptococcus species NOT DETECTED NOT DETECTED Final   Streptococcus agalactiae NOT DETECTED NOT DETECTED Final   Streptococcus pneumoniae NOT DETECTED NOT DETECTED Final   Streptococcus pyogenes NOT DETECTED NOT DETECTED Final   Acinetobacter baumannii NOT DETECTED NOT DETECTED Final   Enterobacteriaceae species DETECTED (A) NOT DETECTED Final    Comment: CRITICAL RESULT CALLED TO, READ BACK BY AND VERIFIED WITH: EBurman Foster.D. 11:20 09/19/15 ([email or web address])    Enterobacter cloacae complex NOT DETECTED NOT DETECTED Final   Escherichia coli NOT DETECTED NOT DETECTED Final   Klebsiella  oxytoca NOT DETECTED NOT DETECTED Final   Klebsiella pneumoniae DETECTED (A) NOT DETECTED Final    Comment: CRITICAL RESULT CALLED TO, READ BACK BY AND VERIFIED WITH: EBurman Foster.D. 11:20 09/19/15 ([email or web address])    Proteus species NOT DETECTED NOT DETECTED Final   Serratia marcescens NOT  DETECTED NOT DETECTED Final   Carbapenem resistance NOT DETECTED NOT DETECTED Final   Haemophilus influenzae NOT DETECTED NOT DETECTED Final   Neisseria meningitidis NOT DETECTED NOT DETECTED Final   Pseudomonas aeruginosa NOT DETECTED NOT DETECTED Final   Candida albicans NOT DETECTED NOT DETECTED Final   Candida glabrata NOT DETECTED NOT DETECTED Final   Candida krusei NOT DETECTED NOT DETECTED Final   Candida parapsilosis NOT DETECTED NOT DETECTED Final   Candida tropicalis NOT DETECTED NOT DETECTED Final  Urine culture     Status: Abnormal   Collection Time: 09/18/15 10:02 PM  Result Value Ref Range Status   Specimen Description URINE, CATHETERIZED  Final   Special Requests NONE  Final   Culture >=100,000 COLONIES/mL KLEBSIELLA PNEUMONIAE (A)  Final   Report Status 09/22/2015 FINAL  Final   Organism ID, Bacteria KLEBSIELLA PNEUMONIAE (A)  Final      Susceptibility   Klebsiella pneumoniae - MIC*    AMPICILLIN 16 RESISTANT Resistant     CEFAZOLIN <=4 SENSITIVE Sensitive     CEFTRIAXONE <=1 SENSITIVE Sensitive     CIPROFLOXACIN <=0.25 SENSITIVE Sensitive     GENTAMICIN <=1 SENSITIVE Sensitive     IMIPENEM <=0.25 SENSITIVE Sensitive     NITROFURANTOIN 64 INTERMEDIATE Intermediate     TRIMETH/SULFA >=320 RESISTANT Resistant     AMPICILLIN/SULBACTAM 4 SENSITIVE Sensitive     PIP/TAZO <=4 SENSITIVE Sensitive     * >=100,000 COLONIES/mL KLEBSIELLA PNEUMONIAE  MRSA PCR Screening     Status: None   Collection Time: 09/19/15  4:59 AM  Result Value Ref Range Status   MRSA by PCR NEGATIVE NEGATIVE Final    Comment:        The GeneXpert MRSA Assay (FDA approved for NASAL specimens only), is one  component of a comprehensive MRSA colonization surveillance program. It is not intended to diagnose MRSA infection nor to guide or monitor treatment for MRSA infections.   Culture, blood (routine x 2)     Status: None (Preliminary result)   Collection Time: 09/21/15  8:00 AM  Result Value Ref Range Status   Specimen Description BLOOD LEFT HAND  Final   Special Requests IN PEDIATRIC BOTTLE 3CC  Final   Culture   Final    NO GROWTH 2 DAYS Performed at [facility]    Report Status PENDING  Incomplete  Culture, blood (routine x 2)     Status: None (Preliminary result)   Collection Time: 09/21/15  8:05 AM  Result Value Ref Range Status   Specimen Description BLOOD LEFT ANTECUBITAL  Final   Special Requests BOTTLES DRAWN AEROBIC AND ANAEROBIC 10CC  Final   Culture   Final    NO GROWTH 2 DAYS Performed at [facility]    Report Status PENDING  Incomplete     Labs: Basic Metabolic Panel:  Recent Labs Lab 09/19/15 0321 09/19/15 [id number] 09/20/15 0322 09/21/15 0313 09/22/15 0316 09/23/15 0550 09/24/15 0529  NA  --  142 141 141 139 140 137  K  --  3.0* 3.2* 3.1* 2.9* 3.1* 3.0*  CL  --  118* 115* 112* 105 105 103  CO2  --  17* 20* 23 25 25 27   GLUCOSE  --  112* 92 101* 83 90 108*  BUN  --  16 16 25* 22* 19 18  CREATININE  --  1.06* 0.73 0.68 0.60 0.57 0.59  CALCIUM  --  8.3* 8.9 9.3 9.2 9.2 9.2  MG 1.3* 1.4* 2.0 2.1  1.7  --   --   PHOS  --  2.0* 2.9 1.4* 1.1*  --   --    Liver Function Tests:  Recent Labs Lab 09/18/15 2141 09/19/15 0619  AST 78* 53*  ALT 49 37  ALKPHOS 79 50  BILITOT 0.9 0.8  PROT 5.8* 4.8*  ALBUMIN 3.4* 2.6*   No results for input(s): LIPASE, AMYLASE in the last 168 hours. No results for input(s): AMMONIA in the last 168 hours. CBC:  Recent Labs Lab 09/18/15 2141  09/20/15 0322 09/21/15 0313 09/22/15 0316 09/23/15 0550 09/24/15 0529  WBC 11.4*  < > 23.1* 23.3* 14.9* 8.7 9.0  NEUTROABS 10.8*  --   --   --   --   --   --    HGB 12.8  < > 11.9* 11.7* 12.3 13.1 12.2  HCT 38.4  < > 35.9* 33.5* 36.7 39.1 35.7*  MCV 91.2  < > 91.3 87.5 89.7 89.3 89.0  PLT 160  < > 81* 63* 74* 86* 111*  < > = values in this interval not displayed. Cardiac Enzymes:  Recent Labs Lab 09/19/15 0321 09/19/15 1303 09/19/15 1951  TROPONINI 0.43* 0.48* 0.50*   BNP: BNP (last 3 results) No results for input(s): BNP in the last 8760 hours.  ProBNP (last 3 results) No results for input(s): PROBNP in the last 8760 hours.  CBG:  Recent Labs Lab 09/20/15 0745 09/21/15 0730 09/22/15 0754 09/23/15 0723 09/24/15 0728  GLUCAP 82 93 113* 93 108*       Signed:  Ahijah Devery MD.  [facility] 09/24/2015, 10:48 AM

## 2015-09-24 NOTE — Progress Notes (Signed)
RN called report to nurse Webb Silversmith at [facility]. Discharge instructions accompanied pt,  Pt left the unit in stable condition via ambulance to SNF.

## 2015-09-26 LAB — CULTURE, BLOOD (ROUTINE X 2)
CULTURE: NO GROWTH
CULTURE: NO GROWTH

## 2015-11-07 ENCOUNTER — Ambulatory Visit: Payer: Self-pay | Admitting: Pharmacist Clinician (PhC)/ Clinical Pharmacy Specialist

## 2015-11-07 DIAGNOSIS — Z7901 Long term (current) use of anticoagulants: Secondary | ICD-10-CM

## 2015-11-07 DIAGNOSIS — I482 Chronic atrial fibrillation, unspecified: Secondary | ICD-10-CM

## 2015-11-21 ENCOUNTER — Encounter (HOSPITAL_COMMUNITY): Payer: Self-pay | Admitting: Emergency Medicine

## 2015-11-21 ENCOUNTER — Inpatient Hospital Stay (HOSPITAL_COMMUNITY)
Admission: EM | Admit: 2015-11-21 | Discharge: 2015-11-28 | DRG: 481 | Disposition: A | Discharge status: Skilled Nursing Facility | Payer: Medicare Other | Attending: Internal Medicine | Admitting: Internal Medicine

## 2015-11-21 ENCOUNTER — Emergency Department (HOSPITAL_COMMUNITY): Payer: Medicare Other

## 2015-11-21 DIAGNOSIS — B961 Klebsiella pneumoniae [K. pneumoniae] as the cause of diseases classified elsewhere: Secondary | ICD-10-CM | POA: Diagnosis present

## 2015-11-21 DIAGNOSIS — Z7901 Long term (current) use of anticoagulants: Secondary | ICD-10-CM

## 2015-11-21 DIAGNOSIS — Z853 Personal history of malignant neoplasm of breast: Secondary | ICD-10-CM

## 2015-11-21 DIAGNOSIS — E039 Hypothyroidism, unspecified: Secondary | ICD-10-CM | POA: Diagnosis present

## 2015-11-21 DIAGNOSIS — Z87891 Personal history of nicotine dependence: Secondary | ICD-10-CM | POA: Diagnosis not present

## 2015-11-21 DIAGNOSIS — I10 Essential (primary) hypertension: Secondary | ICD-10-CM | POA: Diagnosis present

## 2015-11-21 DIAGNOSIS — I480 Paroxysmal atrial fibrillation: Secondary | ICD-10-CM | POA: Diagnosis present

## 2015-11-21 DIAGNOSIS — Z79899 Other long term (current) drug therapy: Secondary | ICD-10-CM

## 2015-11-21 DIAGNOSIS — T45515A Adverse effect of anticoagulants, initial encounter: Secondary | ICD-10-CM | POA: Diagnosis present

## 2015-11-21 DIAGNOSIS — R0602 Shortness of breath: Secondary | ICD-10-CM

## 2015-11-21 DIAGNOSIS — S72009A Fracture of unspecified part of neck of unspecified femur, initial encounter for closed fracture: Secondary | ICD-10-CM | POA: Diagnosis present

## 2015-11-21 DIAGNOSIS — K219 Gastro-esophageal reflux disease without esophagitis: Secondary | ICD-10-CM | POA: Diagnosis present

## 2015-11-21 DIAGNOSIS — S72002A Fracture of unspecified part of neck of left femur, initial encounter for closed fracture: Secondary | ICD-10-CM

## 2015-11-21 DIAGNOSIS — D62 Acute posthemorrhagic anemia: Secondary | ICD-10-CM | POA: Diagnosis not present

## 2015-11-21 DIAGNOSIS — W050XXA Fall from non-moving wheelchair, initial encounter: Secondary | ICD-10-CM | POA: Diagnosis present

## 2015-11-21 DIAGNOSIS — F0390 Unspecified dementia without behavioral disturbance: Secondary | ICD-10-CM | POA: Diagnosis present

## 2015-11-21 DIAGNOSIS — L8990 Pressure ulcer of unspecified site, unspecified stage: Secondary | ICD-10-CM | POA: Insufficient documentation

## 2015-11-21 DIAGNOSIS — Z9189 Other specified personal risk factors, not elsewhere classified: Secondary | ICD-10-CM

## 2015-11-21 DIAGNOSIS — Z419 Encounter for procedure for purposes other than remedying health state, unspecified: Secondary | ICD-10-CM

## 2015-11-21 DIAGNOSIS — R791 Abnormal coagulation profile: Secondary | ICD-10-CM | POA: Diagnosis present

## 2015-11-21 DIAGNOSIS — T45511A Poisoning by anticoagulants, accidental (unintentional), initial encounter: Secondary | ICD-10-CM | POA: Diagnosis not present

## 2015-11-21 DIAGNOSIS — R1319 Other dysphagia: Secondary | ICD-10-CM | POA: Diagnosis present

## 2015-11-21 DIAGNOSIS — E785 Hyperlipidemia, unspecified: Secondary | ICD-10-CM | POA: Diagnosis present

## 2015-11-21 DIAGNOSIS — N390 Urinary tract infection, site not specified: Secondary | ICD-10-CM | POA: Diagnosis present

## 2015-11-21 DIAGNOSIS — S72142A Displaced intertrochanteric fracture of left femur, initial encounter for closed fracture: Secondary | ICD-10-CM | POA: Diagnosis present

## 2015-11-21 DIAGNOSIS — M25552 Pain in left hip: Secondary | ICD-10-CM | POA: Diagnosis present

## 2015-11-21 DIAGNOSIS — D6832 Hemorrhagic disorder due to extrinsic circulating anticoagulants: Secondary | ICD-10-CM | POA: Diagnosis present

## 2015-11-21 DIAGNOSIS — E8770 Fluid overload, unspecified: Secondary | ICD-10-CM

## 2015-11-21 HISTORY — DX: Unspecified dementia, unspecified severity, without behavioral disturbance, psychotic disturbance, mood disturbance, and anxiety: F03.90

## 2015-11-21 LAB — CBC WITH DIFFERENTIAL/PLATELET
BASOS ABS: 0 10*3/uL (ref 0.0–0.1)
BASOS PCT: 0 %
Eosinophils Absolute: 0.1 10*3/uL (ref 0.0–0.7)
Eosinophils Relative: 0 %
HCT: 37.3 % (ref 36.0–46.0)
HEMOGLOBIN: 12.3 g/dL (ref 12.0–15.0)
LYMPHS PCT: 9 %
Lymphs Abs: 1.2 10*3/uL (ref 0.7–4.0)
MCH: 30.8 pg (ref 26.0–34.0)
MCHC: 33 g/dL (ref 30.0–36.0)
MCV: 93.3 fL (ref 78.0–100.0)
MONOS PCT: 7 %
Monocytes Absolute: 0.9 10*3/uL (ref 0.1–1.0)
NEUTROS ABS: 10.9 10*3/uL — AB (ref 1.7–7.7)
NEUTROS PCT: 84 %
Platelets: 227 10*3/uL (ref 150–400)
RBC: 4 MIL/uL (ref 3.87–5.11)
RDW: 14.8 % (ref 11.5–15.5)
WBC: 13 10*3/uL — ABNORMAL HIGH (ref 4.0–10.5)

## 2015-11-21 LAB — PROTIME-INR
INR: 2.35 — AB (ref 0.00–1.49)
Prothrombin Time: 25.5 seconds — ABNORMAL HIGH (ref 11.6–15.2)

## 2015-11-21 LAB — BASIC METABOLIC PANEL
ANION GAP: 6 (ref 5–15)
BUN: 20 mg/dL (ref 6–20)
CALCIUM: 9.8 mg/dL (ref 8.9–10.3)
CO2: 26 mmol/L (ref 22–32)
Chloride: 107 mmol/L (ref 101–111)
Creatinine, Ser: 0.77 mg/dL (ref 0.44–1.00)
GLUCOSE: 141 mg/dL — AB (ref 65–99)
POTASSIUM: 3.8 mmol/L (ref 3.5–5.1)
Sodium: 139 mmol/L (ref 135–145)

## 2015-11-21 MED ORDER — FENTANYL CITRATE (PF) 100 MCG/2ML IJ SOLN
50.0000 ug | Freq: Once | INTRAMUSCULAR | Status: AC
Start: 2015-11-21 — End: 2015-11-21
  Administered 2015-11-21: 50 ug via INTRAVENOUS
  Filled 2015-11-21: qty 2

## 2015-11-21 MED ORDER — MORPHINE SULFATE (PF) 2 MG/ML IV SOLN
0.5000 mg | INTRAVENOUS | Status: DC | PRN
  Administered 2015-11-22 (×2): 0.5 mg via INTRAVENOUS
  Filled 2015-11-21 (×2): qty 1

## 2015-11-21 MED ORDER — DILTIAZEM HCL ER COATED BEADS 180 MG PO CP24
180.0000 mg | ORAL_CAPSULE | Freq: Every day | ORAL | Status: DC
  Administered 2015-11-22 – 2015-11-28 (×5): 180 mg via ORAL
  Filled 2015-11-21 (×7): qty 1

## 2015-11-21 MED ORDER — LEVOTHYROXINE SODIUM 112 MCG PO TABS
112.0000 ug | ORAL_TABLET | Freq: Every day | ORAL | Status: DC
  Administered 2015-11-22 – 2015-11-28 (×6): 112 ug via ORAL
  Filled 2015-11-21 (×5): qty 1

## 2015-11-21 MED ORDER — SODIUM CHLORIDE 0.9 % IV SOLN
INTRAVENOUS | Status: DC
  Administered 2015-11-21: 19:00:00 via INTRAVENOUS

## 2015-11-21 MED ORDER — PHYTONADIONE 5 MG PO TABS
5.0000 mg | ORAL_TABLET | Freq: Once | ORAL | Status: AC
  Administered 2015-11-21: 5 mg via ORAL
  Filled 2015-11-21: qty 1

## 2015-11-21 MED ORDER — ONDANSETRON HCL 4 MG/2ML IJ SOLN
4.0000 mg | Freq: Once | INTRAMUSCULAR | Status: DC
  Filled 2015-11-21 (×2): qty 2

## 2015-11-21 MED ORDER — ONDANSETRON HCL 4 MG/2ML IJ SOLN
4.0000 mg | Freq: Once | INTRAMUSCULAR | Status: AC
  Administered 2015-11-21: 4 mg via INTRAVENOUS

## 2015-11-21 MED ORDER — HYDROCODONE-ACETAMINOPHEN 5-325 MG PO TABS
1.0000 | ORAL_TABLET | Freq: Four times a day (QID) | ORAL | Status: DC | PRN
  Administered 2015-11-21 – 2015-11-22 (×2): 1 via ORAL
  Filled 2015-11-21 (×2): qty 1

## 2015-11-21 MED ORDER — PANTOPRAZOLE SODIUM 40 MG PO TBEC
40.0000 mg | DELAYED_RELEASE_TABLET | Freq: Every day | ORAL | Status: DC
  Administered 2015-11-22 – 2015-11-28 (×5): 40 mg via ORAL
  Filled 2015-11-21 (×5): qty 1

## 2015-11-21 MED ORDER — POLYETHYLENE GLYCOL 3350 17 G PO PACK
17.0000 g | PACK | Freq: Every day | ORAL | Status: DC
  Administered 2015-11-24 – 2015-11-28 (×4): 17 g via ORAL
  Filled 2015-11-21 (×4): qty 1

## 2015-11-21 MED ORDER — FENTANYL CITRATE (PF) 100 MCG/2ML IJ SOLN
50.0000 ug | Freq: Once | INTRAMUSCULAR | Status: AC
  Administered 2015-11-21: 50 ug via INTRAVENOUS
  Filled 2015-11-21: qty 2

## 2015-11-21 MED ORDER — SERTRALINE HCL 50 MG PO TABS
25.0000 mg | ORAL_TABLET | Freq: Every day | ORAL | Status: DC
  Administered 2015-11-22 – 2015-11-28 (×6): 25 mg via ORAL
  Filled 2015-11-21 (×5): qty 1

## 2015-11-21 MED ORDER — ATORVASTATIN CALCIUM 10 MG PO TABS
10.0000 mg | ORAL_TABLET | Freq: Every day | ORAL | Status: DC
  Administered 2015-11-22 – 2015-11-27 (×5): 10 mg via ORAL
  Filled 2015-11-21 (×5): qty 1

## 2015-11-21 MED ORDER — MIRABEGRON ER 25 MG PO TB24
50.0000 mg | ORAL_TABLET | Freq: Every day | ORAL | Status: DC
  Administered 2015-11-22 – 2015-11-28 (×5): 50 mg via ORAL
  Filled 2015-11-21 (×7): qty 2

## 2015-11-21 MED ORDER — DOCUSATE SODIUM 100 MG PO CAPS
100.0000 mg | ORAL_CAPSULE | Freq: Two times a day (BID) | ORAL | Status: DC
  Administered 2015-11-22 – 2015-11-28 (×10): 100 mg via ORAL
  Filled 2015-11-21 (×10): qty 1

## 2015-11-21 MED ORDER — SODIUM CHLORIDE 0.9 % IV SOLN
INTRAVENOUS | Status: DC
  Administered 2015-11-21 – 2015-11-23 (×3): via INTRAVENOUS

## 2015-11-21 MED ORDER — PROPAFENONE HCL 150 MG PO TABS
150.0000 mg | ORAL_TABLET | Freq: Three times a day (TID) | ORAL | Status: DC
  Administered 2015-11-21 – 2015-11-28 (×15): 150 mg via ORAL
  Filled 2015-11-21 (×22): qty 1

## 2015-11-21 NOTE — Progress Notes (Signed)
Patient ID: Shannon [name], female   DOB: [birth year], 82 y.o.   MRN: [id number] I have reviewed Ms. Tanzi's hip x-rays.  She has a left intertrochanteric femur/hip fracture.  This is similar to the one she had on the right side in 2016.  She will need surgery with a rod/hip screw like the other side.  INR is too high for surgery tonight.  Will plan on surgery at the earliest tomorrow evening.  I will also talk with my partner Dr. Marlou Sa who did her other surgery.  Will see her tomorrow morning since I am at [facility] now on Trauma Call and in [place].

## 2015-11-21 NOTE — ED Notes (Signed)
Bed: [facility] Expected date:  Expected time:  Means of arrival:  Comments: EMS-fall 

## 2015-11-21 NOTE — ED Notes (Signed)
EKG given to EDP,Steinl,MD., for review. 

## 2015-11-21 NOTE — ED Notes (Addendum)
Patient from [facility] with complaints of fall today out of wheelchair. Left side hip pain. Given 100 of Fent.

## 2015-11-21 NOTE — ED Provider Notes (Signed)
CSN: [id number]     Arrival date & time 11/21/15  1813 History   First MD Initiated Contact with Patient 11/21/15 1833     Chief Complaint  Patient presents with  . Fall   HPI Pt is a resident of [facility].  She fell out of a wheelchair today and injured her left hip.  Pt does not have to always use a wheelchair.  She is normally able to walk some.  Pt has not been able to stand.  No LOC.  No headache.  No chest pain.  No abdominal pain. Past Medical History  Diagnosis Date  . GERD (gastroesophageal reflux disease)   . Hypertension   . Hypothyroidism   . Cancer [facility])     left breast  . Hx of radiation therapy 2001    left breast  . A-fib ([place])   . Constipation   . HLD (hyperlipidemia)   . GERD (gastroesophageal reflux disease)    Past Surgical History  Procedure Laterality Date  . Breast surgery    . Intramedullary (im) nail intertrochanteric Right 09/24/2014    Procedure: INTRAMEDULLARY (IM) NAIL INTERTROCHANTRIC;  Surgeon: Meredith Pel, MD;  Location: [facility] ORS;  Service: Orthopedics;  Laterality: Right;   No family history on file. Social History  Substance Use Topics  . Smoking status: Former Smoker    Quit date: 05/05/1968  . Smokeless tobacco: Never Used  . Alcohol Use: No   OB History    No data available     Review of Systems  All other systems reviewed and are negative.     Allergies  Demerol; Keflet; Sulfa antibiotics; Tequin; and Penicillins  Home Medications   Prior to Admission medications   Medication Sig Start Date End Date Taking? Authorizing Provider  atorvastatin (LIPITOR) 10 MG tablet Take 10 mg by mouth at bedtime.    Yes Historical Provider, MD  Cholecalciferol (VITAMIN D) 2000 UNITS tablet Take 2,000 Units by mouth daily.   Yes Historical Provider, MD  [employer]S STOOL SOFTENER 100 MG capsule Take 1 capsule by mouth 2 (two) times daily as needed for moderate constipation.  06/28/14  Yes Historical Provider, MD  diltiazem (CARDIZEM CD) 180 MG 24  hr capsule Take 180 mg by mouth daily.  11/10/12  Yes Historical Provider, MD  levothyroxine (SYNTHROID, LEVOTHROID) 112 MCG tablet Take 112 mcg by mouth daily before breakfast.   Yes Historical Provider, MD  MYRBETRIQ 50 MG TB24 Take 50 mg by mouth daily.  11/10/12  Yes Historical Provider, MD  pantoprazole (PROTONIX) 40 MG tablet Take 40 mg by mouth daily.   Yes Historical Provider, MD  polyethylene glycol (MIRALAX / GLYCOLAX) packet Take 17 g by mouth daily. 09/26/14  Yes Shanker Kristeen Mans, MD  propafenone (RYTHMOL) 150 MG tablet Take 150 mg by mouth 3 (three) times daily.   Yes Historical Provider, MD  sertraline (ZOLOFT) 25 MG tablet Take 25 mg by mouth daily.  09/17/15  Yes Historical Provider, MD  [place] Name:     Yes Historical Provider, MD  warfarin (COUMADIN) 2.5 MG tablet Take 2.5 mg by mouth 4 (four) times a week. Tues, Thursday, Sat, and Sun   Yes Historical Provider, MD  warfarin (COUMADIN) 5 MG tablet Take 5 mg by mouth as directed. Take 1 tablet(5 mg) on Mon, Wed, Fri 10/23/12  Yes Historical Provider, MD  acetaminophen (TYLENOL) 500 MG tablet Take 2 tablets (1,000 mg total) by mouth 3 (three) times daily. For 7 days  from 09/26/14 Patient not taking: Reported on 09/18/2015 09/26/14   Jonetta Osgood, MD  cefUROXime (CEFTIN) 500 MG tablet Take 1 tablet (500 mg total) by mouth 2 (two) times daily with a meal. For 6days Patient not taking: Reported on 11/21/2015 09/24/15   Domenic Polite, MD  levothyroxine (SYNTHROID, LEVOTHROID) 125 MCG tablet Take 1 tablet (125 mcg total) by mouth daily before breakfast. Patient not taking: Reported on 11/21/2015 09/26/14   Jonetta Osgood, MD  traMADol (ULTRAM) 50 MG tablet Take 1 tablet (50 mg total) by mouth every 6 (six) hours as needed for moderate pain. Patient not taking: Reported on 11/21/2015 09/26/14   Jonetta Osgood, MD  zolpidem (AMBIEN) 5 MG tablet Take 1 tablet (5 mg total) by mouth at bedtime as needed for sleep. Patient not  taking: Reported on 11/21/2015 09/26/14   Jonetta Osgood, MD   BP 174/86 mmHg  Pulse 74  Temp(Src) 98.7 F (37.1 C) (Oral)  Resp 17  SpO2 98% Physical Exam  Constitutional: No distress.  HENT:  Head: Normocephalic and atraumatic.  Right Ear: External ear normal.  Left Ear: External ear normal.  Eyes: Conjunctivae are normal. Right eye exhibits no discharge. Left eye exhibits no discharge. No scleral icterus.  Neck: Neck supple. No tracheal deviation present.  Cardiovascular: Normal rate, regular rhythm and intact distal pulses.   Pulmonary/Chest: Effort normal and breath sounds normal. No stridor. No respiratory distress. She has no wheezes. She has no rales.  Abdominal: Soft. Bowel sounds are normal. She exhibits no distension. There is no tenderness. There is no rebound and no guarding.  Musculoskeletal: She exhibits tenderness. She exhibits no edema.       Left hip: She exhibits decreased range of motion and tenderness. She exhibits no swelling.  Neurological: She is alert. She has normal strength. No cranial nerve deficit (no facial droop, extraocular movements intact, no slurred speech) or sensory deficit. She exhibits normal muscle tone. She displays no seizure activity. Coordination normal.  Skin: Skin is warm and dry. No rash noted. She is not diaphoretic.  Psychiatric: She has a normal mood and affect.  Nursing note and vitals reviewed.   ED Course  Procedures (including critical care time) Labs Review Labs Reviewed  BASIC METABOLIC PANEL - Abnormal; Notable for the following:    Glucose, Bld 141 (*)    All other components within normal limits  CBC WITH DIFFERENTIAL/PLATELET - Abnormal; Notable for the following:    WBC 13.0 (*)    Neutro Abs 10.9 (*)    All other components within normal limits  PROTIME-INR - Abnormal; Notable for the following:    Prothrombin Time 25.5 (*)    INR 2.35 (*)    All other components within normal limits  TYPE AND SCREEN     Imaging Review Dg Chest 1 View  11/21/2015  CLINICAL DATA:  82 y/o F; left hip fracture. History of hypertension and atrial fibrillation. Ex-smoker. EXAM: CHEST 1 VIEW COMPARISON:  Chest radiograph dated 09/19/2015. FINDINGS: The heart size and mediastinal contours are within normal limits. Both lungs are clear. The visualized skeletal structures are unremarkable. IMPRESSION: No active disease. Electronically Signed   By: Kristine Garbe M.D.   On: 11/21/2015 19:51   Dg Hip Unilat With Pelvis 2-3 Views Left  11/21/2015  CLINICAL DATA:  Fall today out of wheelchair. Left hip pain with external rotation. EXAM: DG HIP (WITH OR WITHOUT PELVIS) 2-3V LEFT COMPARISON:  Pelvis and right hip radiographs 09/23/2014  FINDINGS: Comminuted angulated intertrochanteric fracture left femur. There is displacement and proximal migration of the femoral shaft. Femoral head remains seated in the acetabulum. Post ORIF right hip intertrochanteric fracture. Probable healed fracture of the right inferior pubic ramus. The bones are under mineralized. IMPRESSION: Comminuted intertrochanteric left hip fracture. Electronically Signed   By: Jeb Levering M.D.   On: 11/21/2015 19:53   I have personally reviewed and evaluated these images and lab results as part of my medical decision-making.   EKG Interpretation   Date/Time:  Wednesday November 21 2015 21:16:21 EDT Ventricular Rate:  92 PR Interval:    QRS Duration: 109 QT Interval:  371 QTC Calculation: 459 R Axis:   -40 Text Interpretation:  Sinus rhythm Probable left atrial enlargement Left  axis deviation Abnormal R-wave progression, early transition ST elevation,  consider inferior injury Artifact in lead(s) I V5 Poor data quality  Confirmed by Prentiss Hammett  MD-J, Luise Yamamoto [id number]) on 11/22/2015 4:38:22 PM      MDM   Final diagnoses:  Hip fracture, left, closed, initial encounter ([place])    Xray shows a left hip fracture.  Will consult with orthopedics.  Pt has  seen Dr Marlou Sa before.    Pt is on coumadin for a fib.  That will need to be reversed before she can go to surgery.  Discussed with Dr Eulas Post.  Will arrange for medical admission.    Dorie Rank, MD 11/22/15 [phone]

## 2015-11-22 DIAGNOSIS — F0390 Unspecified dementia without behavioral disturbance: Secondary | ICD-10-CM

## 2015-11-22 DIAGNOSIS — I480 Paroxysmal atrial fibrillation: Secondary | ICD-10-CM

## 2015-11-22 DIAGNOSIS — T45511A Poisoning by anticoagulants, accidental (unintentional), initial encounter: Secondary | ICD-10-CM

## 2015-11-22 DIAGNOSIS — S72002A Fracture of unspecified part of neck of left femur, initial encounter for closed fracture: Secondary | ICD-10-CM

## 2015-11-22 DIAGNOSIS — D689 Coagulation defect, unspecified: Secondary | ICD-10-CM

## 2015-11-22 LAB — CBC
HEMATOCRIT: 31 % — AB (ref 36.0–46.0)
HEMOGLOBIN: 10.2 g/dL — AB (ref 12.0–15.0)
MCH: 31 pg (ref 26.0–34.0)
MCHC: 32.9 g/dL (ref 30.0–36.0)
MCV: 94.2 fL (ref 78.0–100.0)
Platelets: 190 10*3/uL (ref 150–400)
RBC: 3.29 MIL/uL — ABNORMAL LOW (ref 3.87–5.11)
RDW: 14.7 % (ref 11.5–15.5)
WBC: 9 10*3/uL (ref 4.0–10.5)

## 2015-11-22 LAB — URINALYSIS, ROUTINE W REFLEX MICROSCOPIC
BILIRUBIN URINE: NEGATIVE
GLUCOSE, UA: NEGATIVE mg/dL
Ketones, ur: NEGATIVE mg/dL
Nitrite: POSITIVE — AB
PROTEIN: 100 mg/dL — AB
Specific Gravity, Urine: 1.024 (ref 1.005–1.030)
pH: 6.5 (ref 5.0–8.0)

## 2015-11-22 LAB — COMPREHENSIVE METABOLIC PANEL
ALBUMIN: 3.2 g/dL — AB (ref 3.5–5.0)
ALK PHOS: 63 U/L (ref 38–126)
ALT: 16 U/L (ref 14–54)
ANION GAP: 5 (ref 5–15)
AST: 20 U/L (ref 15–41)
BILIRUBIN TOTAL: 0.8 mg/dL (ref 0.3–1.2)
BUN: 16 mg/dL (ref 6–20)
CALCIUM: 9.4 mg/dL (ref 8.9–10.3)
CO2: 26 mmol/L (ref 22–32)
Chloride: 107 mmol/L (ref 101–111)
Creatinine, Ser: 0.62 mg/dL (ref 0.44–1.00)
GFR calc non Af Amer: >60 mL/min (ref >60)
Glucose, Bld: 154 mg/dL — ABNORMAL HIGH (ref 65–99)
POTASSIUM: 4.2 mmol/L (ref 3.5–5.1)
Sodium: 138 mmol/L (ref 135–145)
TOTAL PROTEIN: 5.8 g/dL — AB (ref 6.5–8.1)

## 2015-11-22 LAB — URINE MICROSCOPIC-ADD ON

## 2015-11-22 LAB — PROTIME-INR
INR: 2.63 — AB (ref 0.00–1.49)
PROTHROMBIN TIME: 27.8 s — AB (ref 11.6–15.2)

## 2015-11-22 MED ORDER — ADULT MULTIVITAMIN W/MINERALS CH
1.0000 | ORAL_TABLET | Freq: Every day | ORAL | Status: DC
  Administered 2015-11-22 – 2015-11-28 (×5): 1 via ORAL
  Filled 2015-11-22 (×5): qty 1

## 2015-11-22 MED ORDER — ENSURE ENLIVE PO LIQD
237.0000 mL | Freq: Two times a day (BID) | ORAL | Status: DC
  Administered 2015-11-22 – 2015-11-28 (×4): 237 mL via ORAL

## 2015-11-22 MED ORDER — PHYTONADIONE 5 MG PO TABS
5.0000 mg | ORAL_TABLET | Freq: Once | ORAL | Status: AC
  Administered 2015-11-22: 5 mg via ORAL
  Filled 2015-11-22: qty 1

## 2015-11-22 NOTE — Consult Note (Signed)
Reason for Consult: Left hip pain* Referring Physician: Dr Elmyra Ricks is an 82 y.o. female.  HPI: Shannon [name] is an ambulatory 82 year old female who sustained a mechanical fall yesterday.  Typically she uses a walker but she was not using 1 yesterday.  She does not report any loss of consciousness and currently denies any other orthopedic complaints.  She is currently admitted to the medical service.  INR is elevated at this time.  She is on Coumadin for A. fib.  She is here with her husband.  Past Medical History  Diagnosis Date  . GERD (gastroesophageal reflux disease)   . Hypertension   . Hypothyroidism   . Cancer [facility])     left breast  . Hx of radiation therapy 2001    left breast  . A-fib ([place])   . Constipation   . HLD (hyperlipidemia)   . GERD (gastroesophageal reflux disease)     Past Surgical History  Procedure Laterality Date  . Breast surgery    . Intramedullary (im) nail intertrochanteric Right 09/24/2014    Procedure: INTRAMEDULLARY (IM) NAIL INTERTROCHANTRIC;  Surgeon: Meredith Pel, MD;  Location: [facility];  Service: Orthopedics;  Laterality: Right;    No family history on file.  Social History:  reports that she quit smoking about 47 years ago. She has never used smokeless tobacco. She reports that she does not drink alcohol or use illicit drugs.  Allergies:  Allergies  Allergen Reactions  . Demerol Nausea And Vomiting  . Keflet [Cephalexin] Nausea And Vomiting  . Sulfa Antibiotics Nausea And Vomiting  . Tequin Nausea Only  . Penicillins Rash    Medications: I have reviewed the patient's current medications.  Results for orders placed or performed during the hospital encounter of 11/21/15 (from the past 48 hour(s))  Basic metabolic panel     Status: Abnormal   Collection Time: 11/21/15  7:58 PM  Result Value Ref Range   Sodium 139 135 - 145 mmol/L   Potassium 3.8 3.5 - 5.1 mmol/L   Chloride 107 101 - 111 mmol/L   CO2 26 22 - 32 mmol/L    Glucose, Bld 141 (H) 65 - 99 mg/dL   BUN 20 6 - 20 mg/dL   Creatinine, Ser 0.77 0.44 - 1.00 mg/dL   Calcium 9.8 8.9 - 10.3 mg/dL   GFR calc non Af Amer >60 >60 mL/min   GFR calc Af Amer >60 >60 mL/min    Comment: (NOTE) The eGFR has been calculated using the CKD EPI equation. This calculation has not been validated in all clinical situations. eGFR's persistently <60 mL/min signify possible Chronic Kidney Disease.    Anion gap 6 5 - 15  CBC WITH DIFFERENTIAL     Status: Abnormal   Collection Time: 11/21/15  7:58 PM  Result Value Ref Range   WBC 13.0 (H) 4.0 - 10.5 K/uL   RBC 4.00 3.87 - 5.11 MIL/uL   Hemoglobin 12.3 12.0 - 15.0 g/dL   HCT 37.3 36.0 - 46.0 %   MCV 93.3 78.0 - 100.0 fL   MCH 30.8 26.0 - 34.0 pg   MCHC 33.0 30.0 - 36.0 g/dL   RDW 14.8 11.5 - 15.5 %   Platelets 227 150 - 400 K/uL   Neutrophils Relative % 84 %   Neutro Abs 10.9 (H) 1.7 - 7.7 K/uL   Lymphocytes Relative 9 %   Lymphs Abs 1.2 0.7 - 4.0 K/uL   Monocytes Relative 7 %   Monocytes  Absolute 0.9 0.1 - 1.0 K/uL   Eosinophils Relative 0 %   Eosinophils Absolute 0.1 0.0 - 0.7 K/uL   Basophils Relative 0 %   Basophils Absolute 0.0 0.0 - 0.1 K/uL  Protime-INR     Status: Abnormal   Collection Time: 11/21/15  7:58 PM  Result Value Ref Range   Prothrombin Time 25.5 (H) 11.6 - 15.2 seconds   INR 2.35 (H) 0.00 - 1.49  Type and screen [facility]     Status: None   Collection Time: 11/21/15  7:58 PM  Result Value Ref Range   ABO/RH(D) A POS    Antibody Screen NEG    Sample Expiration 11/24/2015   CBC     Status: Abnormal   Collection Time: 11/22/15  5:00 AM  Result Value Ref Range   WBC 9.0 4.0 - 10.5 K/uL   RBC 3.29 (L) 3.87 - 5.11 MIL/uL   Hemoglobin 10.2 (L) 12.0 - 15.0 g/dL   HCT 31.0 (L) 36.0 - 46.0 %   MCV 94.2 78.0 - 100.0 fL   MCH 31.0 26.0 - 34.0 pg   MCHC 32.9 30.0 - 36.0 g/dL   RDW 14.7 11.5 - 15.5 %   Platelets 190 150 - 400 K/uL  Comprehensive metabolic panel      Status: Abnormal   Collection Time: 11/22/15  5:00 AM  Result Value Ref Range   Sodium 138 135 - 145 mmol/L   Potassium 4.2 3.5 - 5.1 mmol/L   Chloride 107 101 - 111 mmol/L   CO2 26 22 - 32 mmol/L   Glucose, Bld 154 (H) 65 - 99 mg/dL   BUN 16 6 - 20 mg/dL   Creatinine, Ser 0.62 0.44 - 1.00 mg/dL   Calcium 9.4 8.9 - 10.3 mg/dL   Total Protein 5.8 (L) 6.5 - 8.1 g/dL   Albumin 3.2 (L) 3.5 - 5.0 g/dL   AST 20 15 - 41 U/L   ALT 16 14 - 54 U/L   Alkaline Phosphatase 63 38 - 126 U/L   Total Bilirubin 0.8 0.3 - 1.2 mg/dL   GFR calc non Af Amer >60 >60 mL/min   GFR calc Af Amer >60 >60 mL/min    Comment: (NOTE) The eGFR has been calculated using the CKD EPI equation. This calculation has not been validated in all clinical situations. eGFR's persistently <60 mL/min signify possible Chronic Kidney Disease.    Anion gap 5 5 - 15  Protime-INR     Status: Abnormal   Collection Time: 11/22/15  5:00 AM  Result Value Ref Range   Prothrombin Time 27.8 (H) 11.6 - 15.2 seconds   INR 2.63 (H) 0.00 - 1.49  Urinalysis, Routine w reflex microscopic (not at [facility])     Status: Abnormal   Collection Time: 11/22/15  2:33 PM  Result Value Ref Range   Color, Urine AMBER (A) YELLOW    Comment: BIOCHEMICALS MAY BE AFFECTED BY COLOR   APPearance TURBID (A) CLEAR   Specific Gravity, Urine 1.024 1.005 - 1.030   pH 6.5 5.0 - 8.0   Glucose, UA NEGATIVE NEGATIVE mg/dL   Hgb urine dipstick LARGE (A) NEGATIVE   Bilirubin Urine NEGATIVE NEGATIVE   Ketones, ur NEGATIVE NEGATIVE mg/dL   Protein, ur 100 (A) NEGATIVE mg/dL   Nitrite POSITIVE (A) NEGATIVE   Leukocytes, UA LARGE (A) NEGATIVE  Urine microscopic-add on     Status: Abnormal   Collection Time: 11/22/15  2:33 PM  Result Value Ref Range     Squamous Epithelial / LPF 6-30 (A) NONE SEEN   WBC, UA TOO NUMEROUS TO COUNT 0 - 5 WBC/hpf   RBC / HPF TOO NUMEROUS TO COUNT 0 - 5 RBC/hpf   Bacteria, UA MANY (A) NONE SEEN    Dg Chest 1 View  11/21/2015   CLINICAL DATA:  82 y/o F; left hip fracture. History of hypertension and atrial fibrillation. Ex-smoker. EXAM: CHEST 1 VIEW COMPARISON:  Chest radiograph dated 09/19/2015. FINDINGS: The heart size and mediastinal contours are within normal limits. Both lungs are clear. The visualized skeletal structures are unremarkable. IMPRESSION: No active disease. Electronically Signed   By: Lance  Furusawa-Stratton M.D.   On: 11/21/2015 19:51   Dg Hip Unilat With Pelvis 2-3 Views Left  11/21/2015  CLINICAL DATA:  Fall today out of wheelchair. Left hip pain with external rotation. EXAM: DG HIP (WITH OR WITHOUT PELVIS) 2-3V LEFT COMPARISON:  Pelvis and right hip radiographs 09/23/2014 FINDINGS: Comminuted angulated intertrochanteric fracture left femur. There is displacement and proximal migration of the femoral shaft. Femoral head remains seated in the acetabulum. Post ORIF right hip intertrochanteric fracture. Probable healed fracture of the right inferior pubic ramus. The bones are under mineralized. IMPRESSION: Comminuted intertrochanteric left hip fracture. Electronically Signed   By: Melanie  Ehinger M.D.   On: 11/21/2015 19:53    Review of Systems  Constitutional: Negative.   HENT: Negative.   Eyes: Negative.   Respiratory: Negative.   Cardiovascular: Negative.   Gastrointestinal: Negative.   Genitourinary: Negative.   Musculoskeletal: Positive for joint pain.  Skin: Negative.   Neurological: Negative.   Endo/Heme/Allergies: Negative.   Psychiatric/Behavioral: Positive for memory loss.   Blood pressure 110/55, pulse 90, temperature 98.8 F (37.1 C), temperature source Axillary, resp. rate 17, height 5' 5" (1.651 m), weight 70.308 kg (155 lb), SpO2 93 %. Physical Exam  Constitutional: She appears well-developed.  HENT:  Head: Normocephalic.  Eyes: Pupils are equal, round, and reactive to light.  Neck: Normal range of motion.  Cardiovascular: Normal rate.   Respiratory: Effort normal.   Neurological: She is alert.  Skin: Skin is warm.  Psychiatric: She has a normal mood and affect.   patient has good range of motion and function of her bilateral upper extremities.  Both hands are perfused.  Neck has good range of motion.  Lower extremities demonstrate no pain with range of motion of the right hip.  No crepitus or swelling in the right ankle knee.  On the left-hand side there is pain with any range of motion.  Pedal pulses palpable bilaterally with intact sensation on the dorsal and plantar aspect of the foot  Assessment/Plan: Impression is left hip intertrochanteric fracture plan intramedullary hip screw fixation she had similar fracture and did well with similar surgery on the right-hand side several years ago.  Currently INR is 2.6.  If it can get down below to I think surgery could be performed.  She is receiving vitamin K and we could augment that with fresh frozen plasma around the time of surgery.  If INR remains elevated we may have to perform surgery Saturday.  Tentatively the plan is for surgery tomorrow afternoon.  Patient and family understand risks and benefits of surgery  , SCOTT 11/22/2015, 8:41 PM      

## 2015-11-22 NOTE — Progress Notes (Signed)
PROGRESS NOTE  Shannon [name]  [id number] DOB: [birth year]  DOA: 11/21/2015 PCP: [name], MD   Brief Narrative:  82 year old female, PMH of dementia, paroxysmal A. fib on chronic Coumadin anticoagulation, GERD, HTN, hypothyroid, breast cancer status post XRT, HLD, presented to [facility] ED on 7/20 following accidental fall at home/? ALF and noted to have comminuted angulated intertrochanteric fracture of the left femur. Orthopedics was consulted and planned surgical fixation on 7/21 pending improvement in INR.   Assessment & Plan:   Principal Problem:   Hip fracture ([place]) Active Problems:   A-fib ([facility])   Warfarin-induced coagulopathy ([place])   Dementia   Left hip fracture - Admitted to medical floor and orthopedics was consulted. - Based on available data, patient is at low risk for perioperative CV events. May proceed with indicated surgery with close perioperative monitoring without any further cardiac workup. - INR 2.35 on 7/19 increased to 2.63 on 7/20. Repeat vitamin K and recheck INR in a.m. - As per orthopedics, considering surgery on 7/21.  Paroxysmal atrial fibrillation - Currently in sinus rhythm. Continue propafenone on and diltiazem. Coumadin held and INR being reversed for surgery. Repeat her dose of vitamin K 5 mg 1. - EKG 7/20: Normal sinus rhythm, LAD, LAFB, nonspecific T-wave abnormalities and no acute changes. QTC 459 ms - LVEF 09/20/15: 60-65 percent. - CHADS-VASC score of 4 - Resume Coumadin postop when okay with orthopedics.  Hypothyroid - Continue Synthroid  Hyperlipidemia - Continue statin  GERD -Continue PPI   Dementia - Mental status possibly at baseline. No agitation.  Anemia - Follow CBCs.   DVT prophylaxis:  SCD's Code Status: Full  Family Communication: None at bedside. Discussed with patient.  Disposition Plan: To be determined. Likely to SNF when medically stable.   Consultants:   Orthopedics   Procedures:    None  Antimicrobials:   None    Subjective: Left hip pain on movement. Patient states that she lives at home, ambulates without support and even climbs a flight of stairs. Denies any chest pain, dyspnea or palpitations, dizziness or lightheadedness. No syncopal episode reported. Denies MI, CAD or CHF.  Objective:  Filed Vitals:   11/21/15 2235 11/22/15 0018 11/22/15 0316 11/22/15 0505  BP: 114/66   125/76  Pulse: 96  97 96  Temp: 100.6 F (38.1 C)   98.8 F (37.1 C)  TempSrc: Oral   Oral  Resp: 20 18 20 19   Height: 5\' 5"  (1.651 m) 5\' 5"  (1.651 m)    Weight: 70.308 kg (155 lb)     SpO2: 98% 95% 95% 98%    Intake/Output Summary (Last 24 hours) at 11/22/15 1325 Last data filed at 11/22/15 1222  Gross per 24 hour  Intake 1327.08 ml  Output      0 ml  Net 1327.08 ml   Filed Weights   11/21/15 2235  Weight: 70.308 kg (155 lb)    Examination:  General exam: Pleasant elderly female lying comfortably supine in bed.  Respiratory system: Clear to auscultation. Respiratory effort normal. Cardiovascular system: S1 & S2 heard, RRR. No JVD, murmurs, rubs, gallops or clicks. No pedal edema. Gastrointestinal system: Abdomen is nondistended, soft and nontender. No organomegaly or masses felt. Normal bowel sounds heard. Central nervous system: Alert and oriented 2. No focal neurological deficits. Extremities: Symmetric 5 x 5 power In all limbs except left lower extremity with assessment limited by pain. Left lower extremity externally rotated  Skin: No rashes, lesions or ulcers Psychiatry: Judgement  and insight appear normal. Mood & affect appropriate.     Data Reviewed: I have personally reviewed following labs and imaging studies  CBC:  Recent Labs Lab 11/21/15 1958 11/22/15 0500  WBC 13.0* 9.0  NEUTROABS 10.9*  --   HGB 12.3 10.2*  HCT 37.3 31.0*  MCV 93.3 94.2  PLT 227 [id number]   Basic Metabolic Panel:  Recent Labs Lab 11/21/15 1958 11/22/15 0500  NA 139 138    K 3.8 4.2  CL 107 107  CO2 26 26  GLUCOSE 141* 154*  BUN 20 16  CREATININE 0.77 0.62  CALCIUM 9.8 9.4   GFR: Estimated Creatinine Clearance: 46.9 mL/min (by C-G formula based on Cr of 0.62). Liver Function Tests:  Recent Labs Lab 11/22/15 0500  AST 20  ALT 16  ALKPHOS 63  BILITOT 0.8  PROT 5.8*  ALBUMIN 3.2*   No results for input(s): LIPASE, AMYLASE in the last 168 hours. No results for input(s): AMMONIA in the last 168 hours. Coagulation Profile:  Recent Labs Lab 11/21/15 1958 11/22/15 0500  INR 2.35* 2.63*   Cardiac Enzymes: No results for input(s): CKTOTAL, CKMB, CKMBINDEX, TROPONINI in the last 168 hours. BNP (last 3 results) No results for input(s): PROBNP in the last 8760 hours. HbA1C: No results for input(s): HGBA1C in the last 72 hours. CBG: No results for input(s): GLUCAP in the last 168 hours. Lipid Profile: No results for input(s): CHOL, HDL, LDLCALC, TRIG, CHOLHDL, LDLDIRECT in the last 72 hours. Thyroid Function Tests: No results for input(s): TSH, T4TOTAL, FREET4, T3FREE, THYROIDAB in the last 72 hours. Anemia Panel: No results for input(s): VITAMINB12, FOLATE, FERRITIN, TIBC, IRON, RETICCTPCT in the last 72 hours.  Sepsis Labs: No results for input(s): PROCALCITON, LATICACIDVEN in the last 168 hours.  No results found for this or any previous visit (from the past 240 hour(s)).       Radiology Studies: Dg Chest 1 View  11/21/2015  CLINICAL DATA:  82 y/o F; left hip fracture. History of hypertension and atrial fibrillation. Ex-smoker. EXAM: CHEST 1 VIEW COMPARISON:  Chest radiograph dated 09/19/2015. FINDINGS: The heart size and mediastinal contours are within normal limits. Both lungs are clear. The visualized skeletal structures are unremarkable. IMPRESSION: No active disease. Electronically Signed   By: Kristine Garbe M.D.   On: 11/21/2015 19:51   Dg Hip Unilat With Pelvis 2-3 Views Left  11/21/2015  CLINICAL DATA:  Fall today  out of wheelchair. Left hip pain with external rotation. EXAM: DG HIP (WITH OR WITHOUT PELVIS) 2-3V LEFT COMPARISON:  Pelvis and right hip radiographs 09/23/2014 FINDINGS: Comminuted angulated intertrochanteric fracture left femur. There is displacement and proximal migration of the femoral shaft. Femoral head remains seated in the acetabulum. Post ORIF right hip intertrochanteric fracture. Probable healed fracture of the right inferior pubic ramus. The bones are under mineralized. IMPRESSION: Comminuted intertrochanteric left hip fracture. Electronically Signed   By: Jeb Levering M.D.   On: 11/21/2015 19:53        Scheduled Meds: . atorvastatin  10 mg Oral QHS  . diltiazem  180 mg Oral Daily  . docusate sodium  100 mg Oral BID  . levothyroxine  112 mcg Oral QAC breakfast  . mirabegron ER  50 mg Oral Daily  . ondansetron  4 mg Intravenous Once  . pantoprazole  40 mg Oral Daily  . polyethylene glycol  17 g Oral Daily  . propafenone  150 mg Oral TID  . sertraline  25 mg Oral Daily  Continuous Infusions: . sodium chloride 75 mL/hr at 11/22/15 0852     LOS: 1 day    Time spent: 40 minutes.    [facility], MD [facility] Pager [phone]  If 7PM-7AM, please contact night-coverage [email or web address] Password [email or web address] 11/22/2015, 1:25 PM

## 2015-11-22 NOTE — Plan of Care (Signed)
Problem: Education: Goal: Verbalization of understanding the information provided (i.e., activity precautions, restrictions, etc) will improve Outcome: Progressing Needs reinforcement.

## 2015-11-22 NOTE — Progress Notes (Signed)
CSW received consult that patient was admitted from [facility]. CSW confirmed with patient & husband, Joelyn Oms at bedside that patient is a long term resident there and plans to return at discharge. CSW also confirmed with Claiborne Billings at [facility] that they would be able to take patient back at discharge.    Raynaldo Opitz, [facility] Clinical Social Worker cell #: [phone]

## 2015-11-22 NOTE — Progress Notes (Signed)
xrays reviewed Left hip it fx needs imhs inr 2.63 - needs to be lower Looking at friday

## 2015-11-22 NOTE — H&P (Signed)
History and Physical    Shannon [name] [id number] DOB: 1925-03-31 DOA: 11/21/2015  PCP: [name], MD   Patient coming from: [place]  Chief Complaint: Left hip pain after accidental fall  HPI: Shannon [name] is a 82 y.o. woman with a history of dementia, paroxysmal atrial fibrillation (anticoagulated with warfarin, CHADS-VASC score of 4), HTN, and breast cancer who presents to the ED for evaluation of hip pain after having an accidental fall at her assisted living facility.  Her husband is at bedside and assists with her history.  Reportedly, she was in the restroom, and it is not clear if she was getting into or out of the wheelchair.  He did not hear her fall, but when he realized that it was taking her too long to come out, he discovered that she was on the ground.  Nursing personnel showed up around the same time.  There was no reported loss of consciousness.  She denies preceding aura, headache, light-headedness, chest pain, or shortness of breath.  ED Course: Unfortunately, xray of the left hip shows a comminuted angulated intertrochanteric fracture of the left femur.  Hospitalist asked to admit; orthopedic surgery consulted from the ED.  Review of Systems: As per HPI otherwise 10 point review of systems negative.    Past Medical History  Diagnosis Date  . GERD (gastroesophageal reflux disease)   . Hypertension   . Hypothyroidism   . Cancer [facility])     left breast  . Hx of radiation therapy 2001    left breast  . A-fib ([place])   . Constipation   . HLD (hyperlipidemia)   . GERD (gastroesophageal reflux disease)     Past Surgical History  Procedure Laterality Date  . Breast surgery    . Intramedullary (im) nail intertrochanteric Right 09/24/2014    Procedure: INTRAMEDULLARY (IM) NAIL INTERTROCHANTRIC;  Surgeon: Meredith Pel, MD;  Location: [facility];  Service: Orthopedics;  Laterality: Right;     reports that she quit smoking about 47  years ago. She has never used smokeless tobacco. She reports that she does not drink alcohol or use illicit drugs.  She is married.  She has two children.  Her son Marylyn Ishihara is her healthcare POA.  Allergies  Allergen Reactions  . Demerol Nausea And Vomiting  . Keflet [Cephalexin] Nausea And Vomiting  . Sulfa Antibiotics Nausea And Vomiting  . Tequin Nausea Only  . Penicillins Rash   Family History: She denies any known family history of heart disease or malignancy.  Prior to Admission medications   Medication Sig Start Date End Date Taking? Authorizing Provider  atorvastatin (LIPITOR) 10 MG tablet Take 10 mg by mouth at bedtime.    Yes Historical Provider, MD  Cholecalciferol (VITAMIN D) 2000 UNITS tablet Take 2,000 Units by mouth daily.   Yes Historical Provider, MD  [employer] STOOL SOFTENER 100 MG capsule Take 1 capsule by mouth 2 (two) times daily as needed for moderate constipation.  06/28/14  Yes Historical Provider, MD  diltiazem (CARDIZEM CD) 180 MG 24 hr capsule Take 180 mg by mouth daily.  11/10/12  Yes Historical Provider, MD  levothyroxine (SYNTHROID, LEVOTHROID) 112 MCG tablet Take 112 mcg by mouth daily before breakfast.   Yes Historical Provider, MD  MYRBETRIQ 50 MG TB24 Take 50 mg by mouth daily.  11/10/12  Yes Historical Provider, MD  pantoprazole (PROTONIX) 40 MG tablet Take 40 mg by mouth daily.   Yes Historical Provider, MD  polyethylene glycol (  MIRALAX / GLYCOLAX) packet Take 17 g by mouth daily. 09/26/14  Yes Shanker Kristeen Mans, MD  propafenone (RYTHMOL) 150 MG tablet Take 150 mg by mouth 3 (three) times daily.   Yes Historical Provider, MD  sertraline (ZOLOFT) 25 MG tablet Take 25 mg by mouth daily.  09/17/15  Yes Historical Provider, MD  UNABLE TO FIND Take 60 mLs by mouth 3 (three) times daily. Med Name:MedPass 2.0   Yes Historical Provider, MD  warfarin (COUMADIN) 2.5 MG tablet Take 2.5 mg by mouth 4 (four) times a week. Tues, Thursday, Sat, and Sun   Yes Historical Provider, MD    warfarin (COUMADIN) 5 MG tablet Take 5 mg by mouth as directed. Take 1 tablet(5 mg) on Mon, Wed, Fri 10/23/12  Yes Historical Provider, MD    Physical Exam: Filed Vitals:   11/21/15 1832 11/21/15 2216 11/21/15 2235  BP: 174/86 135/71 114/66  Pulse: 74 93 96  Temp: 98.7 F (37.1 C)  100.6 F (38.1 C)  TempSrc: Oral  Oral  Resp: 17 22 20   Height:   5\' 5"  (1.651 m)  SpO2: 98% 95% 98%      Constitutional: NAD, calm, comfortable Filed Vitals:   11/21/15 1832 11/21/15 2216 11/21/15 2235  BP: 174/86 135/71 114/66  Pulse: 74 93 96  Temp: 98.7 F (37.1 C)  100.6 F (38.1 C)  TempSrc: Oral  Oral  Resp: 17 22 20   Height:   5\' 5"  (1.651 m)  SpO2: 98% 95% 98%   Eyes: PERRL, lids and conjunctivae normal ENMT: Mucous membranes are DRY. Posterior pharynx clear of any exudate or lesions. Neck: normal appearance, supple Respiratory: clear to auscultation listening anteriorly.  Normal respiratory effort. No accessory muscle use.  Cardiovascular: Normal rate, regular rhythm.  No extremity edema. 2+ pedal pulses. GI: abdomen is soft and compressible.  No distention.  No tenderness.  Bowel sounds are present. Musculoskeletal:  No joint deformity in upper extremities.  LLE ROM limited by left hip pain.  No contractures. Normal muscle tone.  Skin: no rashes, warm and dry Neurologic: No focal deficits. Psychiatric: Awake and alert but disoriented.  Normal mood.  Affect appropriate.    Labs on Admission: I have personally reviewed following labs and imaging studies  CBC:  Recent Labs Lab 11/21/15 1958  WBC 13.0*  NEUTROABS 10.9*  HGB 12.3  HCT 37.3  MCV 93.3  PLT [id number]   Basic Metabolic Panel:  Recent Labs Lab 11/21/15 1958  NA 139  K 3.8  CL 107  CO2 26  GLUCOSE 141*  BUN 20  CREATININE 0.77  CALCIUM 9.8   Coagulation Profile:  Recent Labs Lab 11/21/15 1958  INR 2.35*   Radiological Exams on Admission: Dg Chest 1 View  11/21/2015  CLINICAL DATA:  82 y/o F; left  hip fracture. History of hypertension and atrial fibrillation. Ex-smoker. EXAM: CHEST 1 VIEW COMPARISON:  Chest radiograph dated 09/19/2015. FINDINGS: The heart size and mediastinal contours are within normal limits. Both lungs are clear. The visualized skeletal structures are unremarkable. IMPRESSION: No active disease. Electronically Signed   By: Kristine Garbe M.D.   On: 11/21/2015 19:51   Dg Hip Unilat With Pelvis 2-3 Views Left  11/21/2015  CLINICAL DATA:  Fall today out of wheelchair. Left hip pain with external rotation. EXAM: DG HIP (WITH OR WITHOUT PELVIS) 2-3V LEFT COMPARISON:  Pelvis and right hip radiographs 09/23/2014 FINDINGS: Comminuted angulated intertrochanteric fracture left femur. There is displacement and proximal migration of the femoral shaft.  Femoral head remains seated in the acetabulum. Post ORIF right hip intertrochanteric fracture. Probable healed fracture of the right inferior pubic ramus. The bones are under mineralized. IMPRESSION: Comminuted intertrochanteric left hip fracture. Electronically Signed   By: Jeb Levering M.D.   On: 11/21/2015 19:53    EKG: Pending  Assessment/Plan Principal Problem:   Hip fracture (HCC) Active Problems:   A-fib (HCC)   Warfarin-induced coagulopathy (HCC)   Dementia      Left hip fracture --Admit to med surg bed --Per ED physician, orthopedic surgeon may not see her until AM.  However, would not expect her to go to OR until INR is reversed.  Vitamin K 5 mg po one time now.  PT/INR in the AM --Analgesics as needed --Bed rest for now --SCDs for now, defer further DVT prophylaxis order to orthopedic surgeon (post-operatively)  Paroxysmal atrial fibrillation --Continue home dose of propafenone and cardizem. --Warfarin anticoagulation being reversed because she will need surgical intervention for her hip fracture --Baseline EKG pending  Hypothyroidism --Continue home dose of synthroid  HLD --Continue  statin  GERD --Continue PPI   DVT prophylaxis: SCDs for now, reversing chronic anticoagulation Code Status: FULL Family Communication: Husband, son at bedside in the ED at time of admission Disposition Plan: Expect she will need SNF short term after surgery Consults called: Orthopedic surgery Admission status: Inpatient, med surg   TIME SPENT:  60 minutes   Eber Jones MD [facility] Pager [phone]  If 7PM-7AM, please contact night-coverage [email or web address] Password [facility]  11/22/2015, 12:27 AM

## 2015-11-22 NOTE — Care Management Note (Signed)
Case Management Note  Patient Details  Name: Shannon [name] MRN: [id number] Date of Birth: [birth year]  Subjective/Objective:                  comminuted angulated intertrochanteric fracture of the left femur Action/Plan: Discharge planning Expected Discharge Date:   (unknown)               Expected Discharge Plan:  [place]  In-House Referral:     Discharge planning Services  CM Consult  Post Acute Care Choice:    Choice offered to:  Adult Children, Patient  DME Arranged:    DME Agency:     HH Arranged:    HH Agency:     Status of Service:  In process, will continue to follow  If discussed at Long Length of Stay Meetings, dates discussed:    Additional Comments: Cm met with pt and daughter in law Brita Jurgensen to clarify residence.  Greig Right states pt lives at [facility].  CSW made aware and consult placed to please follow for return after surgery.  CM will continue to follow if disposition changes. Dellie Catholic, RN 11/22/2015, 12:41 PM

## 2015-11-22 NOTE — Progress Notes (Signed)
Initial Nutrition Assessment  INTERVENTION:   -Provide Ensure Enlive po BID, each supplement provides 350 kcal and 20 grams of protein -Multivitamin with minerals daily -Encourage PO intake -RD to continue to monitor  NUTRITION DIAGNOSIS:   Increased nutrient needs related to other (see comment) (hip fracture) as evidenced by estimated needs.  GOAL:   Patient will meet greater than or equal to 90% of their needs  MONITOR:   PO intake, Supplement acceptance, Labs, Weight trends, I & O's  REASON FOR ASSESSMENT:   Consult Hip fracture protocol  ASSESSMENT:   82 year old female, PMH of dementia, paroxysmal A. fib on chronic Coumadin anticoagulation, GERD, HTN, hypothyroid, breast cancer status post XRT, HLD, presented to [facility] ED on 7/20 following accidental fall at home/? ALF and noted to have comminuted angulated intertrochanteric fracture of the left femur. Orthopedics was consulted and planned surgical fixation on 7/21 pending improvement in INR.  Patient in room with husband at bedside. Pt pleasantly confused when questioned. Pt with history of dementia. Pt had her lunch tray in front of her, observed no PO intake yet. Encouraged pt to at least eat the chicken and cheese on her salad. Pt may have surgery 7/21. Discussed increased needs with pt and husband, they agreed to having Ensure supplements provided. RD to order. Pt's weight has remained stable. Nutrition-Focused physical exam completed. Findings are mild-moderate fat depletion, mild-moderate muscle depletion, and no edema.   Labs reviewed. Medications: Vitamin K tablet once  Diet Order:  Diet Heart Room service appropriate?: Yes; Fluid consistency:: Thin  Skin:  Wound (see comment) (R arm laceration)  Last BM:  7/17  Height:   Ht Readings from Last 1 Encounters:  11/22/15 5\' 5"  (1.651 m)    Weight:   Wt Readings from Last 1 Encounters:  11/21/15 155 lb (70.308 kg)    Ideal Body Weight:  56.8  kg  BMI:  Body mass index is 25.79 kg/(m^2).  Estimated Nutritional Needs:   Kcal:  1600-1800  Protein:  75-85g  Fluid:  1.8L/day  EDUCATION NEEDS:   Education needs addressed  Clayton Bibles, MS, RD, LDN Pager: [phone] After Hours Pager: [phone]

## 2015-11-22 NOTE — NC FL2 (Signed)
[place] LEVEL OF CARE SCREENING TOOL     IDENTIFICATION  Patient Name: Shannon [name] Birthdate: 04-Mar-1925 Sex: female Admission Date (Current Location): 11/21/2015  [facility] and [place] Number:  Herbalist and Address:  [facility],  [place] [address] [place]      Provider Number: [id number]  Attending Physician Name and Address:  Modena Jansky, MD  Relative Name and Phone Number:       Current Level of Care: Hospital Recommended Level of Care: [place] Prior Approval Number:    Date Approved/Denied:   PASRR Number: [id number]  Discharge Plan: SNF    Current Diagnoses: Patient Active Problem List   Diagnosis Date Noted  . Hip fracture ([place]) 11/21/2015  . Dementia 11/21/2015  . Sepsis ([place]) 09/19/2015  . Hypokalemia 09/19/2015  . UTI (lower urinary tract infection) 09/19/2015  . Acute encephalopathy 09/19/2015  . HLD (hyperlipidemia)   . GERD (gastroesophageal reflux disease)   . Gastroesophageal reflux disease without esophagitis   . Closed right hip fracture ([place]) 09/24/2014  . Fracture, intertrochanteric, right femur ([place]) 09/23/2014  . Long term (current) use of anticoagulants 11/30/2012  . Cancer ([place])   . Cancer of breast, intraductal 12/01/2011  . UTI (urinary tract infection) 07/04/2011  . SDH (subdural hematoma) ([place]) 06/30/2011  . Occipital fracture ([place]) 06/30/2011  . Fall 06/30/2011  . A-fib ([place]) 06/30/2011  . Warfarin-induced coagulopathy ([place]) 06/30/2011  . Hypothyroidism 06/30/2011    Orientation RESPIRATION BLADDER Height & Weight     Self, Place  Normal Continent Weight: 155 lb (70.308 kg) Height:  5\' 5"  (165.1 cm)  BEHAVIORAL SYMPTOMS/MOOD NEUROLOGICAL BOWEL NUTRITION STATUS      Continent Diet (Heart)  AMBULATORY STATUS COMMUNICATION OF NEEDS Skin   Extensive Assist Verbally Surgical wounds  (/Incision(OpenorDehisced)07/19/17LacerationArmRight;Left;Posterior;Lowerskintearstobilateralforearms)                       Personal Care Assistance Level of Assistance  Bathing, Feeding, Dressing Bathing Assistance: Limited assistance Feeding assistance: Limited assistance Dressing Assistance: Limited assistance     Functional Limitations Info  Sight, Hearing, Speech Sight Info: Adequate Hearing Info: Adequate      SPECIAL CARE FACTORS FREQUENCY  PT (By licensed PT), OT (By licensed OT)     PT Frequency: 5 OT Frequency: 5            Contractures      Additional Factors Info  Code Status, Allergies Code Status Info: Fullcode Allergies Info: Allergies:  Demerol, Keflet, Sulfa Antibiotics, Tequin, Penicillins           Current Medications (11/22/2015):  This is the current hospital active medication list Current Facility-Administered Medications  Medication Dose Route Frequency Provider Last Rate Last Dose  . 0.9 %  sodium chloride infusion   Intravenous Continuous Modena Jansky, MD 75 mL/hr at 11/22/15 [id number]    . atorvastatin (LIPITOR) tablet 10 mg  10 mg Oral QHS Lily Kocher, MD   10 mg at 11/21/15 2338  . diltiazem (CARDIZEM CD) 24 hr capsule 180 mg  180 mg Oral Daily Lily Kocher, MD   180 mg at 11/22/15 1029  . docusate sodium (COLACE) capsule 100 mg  100 mg Oral BID Lily Kocher, MD   100 mg at 11/22/15 1028  . feeding supplement (ENSURE ENLIVE) (ENSURE ENLIVE) liquid 237 mL  237 mL Oral BID BM Modena Jansky, MD      . HYDROcodone-acetaminophen (NORCO/VICODIN) 5-325 MG  per tablet 1-2 tablet  1-2 tablet Oral Q6H PRN Lily Kocher, MD   1 tablet at 11/22/15 1029  . levothyroxine (SYNTHROID, LEVOTHROID) tablet 112 mcg  112 mcg Oral QAC breakfast Lily Kocher, MD   112 mcg at 11/22/15 [phone]  . mirabegron ER (MYRBETRIQ) tablet 50 mg  50 mg Oral Daily Lily Kocher, MD   50 mg at 11/22/15 1029  . morphine 2 MG/ML injection 0.5 mg  0.5 mg Intravenous  Q2H PRN Lily Kocher, MD   0.5 mg at 11/22/15 1152  . multivitamin with minerals tablet 1 tablet  1 tablet Oral Daily Modena Jansky, MD      . ondansetron [facility]) injection 4 mg  4 mg Intravenous Once Dorie Rank, MD      . pantoprazole (PROTONIX) EC tablet 40 mg  40 mg Oral Daily Lily Kocher, MD   40 mg at 11/22/15 1028  . phytonadione (VITAMIN K) tablet 5 mg  5 mg Oral Once Modena Jansky, MD      . polyethylene glycol (MIRALAX / GLYCOLAX) packet 17 g  17 g Oral Daily Lily Kocher, MD   17 g at 11/22/15 1000  . propafenone (RYTHMOL) tablet 150 mg  150 mg Oral TID Lily Kocher, MD   150 mg at 11/22/15 1028  . sertraline (ZOLOFT) tablet 25 mg  25 mg Oral Daily Lily Kocher, MD   25 mg at 11/22/15 1028     Discharge Medications: Please see discharge summary for a list of discharge medications.  Relevant Imaging Results:  Relevant Lab Results:   Additional Information SSN: [id number]  Standley Brooking, LCSW

## 2015-11-23 ENCOUNTER — Encounter (HOSPITAL_COMMUNITY)
Admission: EM | Disposition: A | Discharge status: Skilled Nursing Facility | Payer: Self-pay | Source: Home / Self Care | Attending: Internal Medicine

## 2015-11-23 ENCOUNTER — Inpatient Hospital Stay (HOSPITAL_COMMUNITY): Payer: Medicare Other

## 2015-11-23 ENCOUNTER — Inpatient Hospital Stay (HOSPITAL_COMMUNITY): Payer: Medicare Other | Admitting: Anesthesiology

## 2015-11-23 HISTORY — PX: FEMUR IM NAIL: SHX1597

## 2015-11-23 LAB — CBC
HCT: 23.9 % — ABNORMAL LOW (ref 36.0–46.0)
HEMOGLOBIN: 7.8 g/dL — AB (ref 12.0–15.0)
MCH: 31.1 pg (ref 26.0–34.0)
MCHC: 32.6 g/dL (ref 30.0–36.0)
MCV: 95.2 fL (ref 78.0–100.0)
Platelets: 142 10*3/uL — ABNORMAL LOW (ref 150–400)
RBC: 2.51 MIL/uL — AB (ref 3.87–5.11)
RDW: 14.7 % (ref 11.5–15.5)
WBC: 9.4 10*3/uL (ref 4.0–10.5)

## 2015-11-23 LAB — PROTIME-INR
INR: 2.14 — AB (ref 0.00–1.49)
PROTHROMBIN TIME: 23.8 s — AB (ref 11.6–15.2)

## 2015-11-23 LAB — PREPARE RBC (CROSSMATCH)

## 2015-11-23 LAB — SURGICAL PCR SCREEN
MRSA, PCR: POSITIVE — AB
STAPHYLOCOCCUS AUREUS: POSITIVE — AB

## 2015-11-23 SURGERY — INSERTION, INTRAMEDULLARY ROD, FEMUR
Anesthesia: General | Site: Hip | Laterality: Left

## 2015-11-23 MED ORDER — MENTHOL 3 MG MT LOZG: 1.0000 | LOZENGE | OROMUCOSAL | Status: DC | PRN

## 2015-11-23 MED ORDER — FENTANYL CITRATE (PF) 100 MCG/2ML IJ SOLN
INTRAMUSCULAR | Status: DC | PRN
  Administered 2015-11-23 (×5): 50 ug via INTRAVENOUS

## 2015-11-23 MED ORDER — VANCOMYCIN HCL IN DEXTROSE 1-5 GM/200ML-% IV SOLN
1000.0000 mg | Freq: Once | INTRAVENOUS | Status: AC
  Administered 2015-11-24: 1000 mg via INTRAVENOUS
  Filled 2015-11-23: qty 200

## 2015-11-23 MED ORDER — PROPOFOL 10 MG/ML IV BOLUS
INTRAVENOUS | Status: DC | PRN
  Administered 2015-11-23: 60 mg via INTRAVENOUS

## 2015-11-23 MED ORDER — ROCURONIUM BROMIDE 100 MG/10ML IV SOLN
INTRAVENOUS | Status: DC | PRN
  Administered 2015-11-23: 20 mg via INTRAVENOUS

## 2015-11-23 MED ORDER — ACETAMINOPHEN 650 MG RE SUPP: 650.0000 mg | Freq: Four times a day (QID) | RECTAL | Status: DC | PRN

## 2015-11-23 MED ORDER — MUPIROCIN 2 % EX OINT
1.0000 "application " | TOPICAL_OINTMENT | Freq: Two times a day (BID) | CUTANEOUS | Status: AC
  Administered 2015-11-23 – 2015-11-27 (×10): 1 via NASAL
  Filled 2015-11-23 (×2): qty 22

## 2015-11-23 MED ORDER — PROMETHAZINE HCL 25 MG/ML IJ SOLN: 6.2500 mg | INTRAMUSCULAR | Status: DC | PRN

## 2015-11-23 MED ORDER — VANCOMYCIN HCL IN DEXTROSE 1-5 GM/200ML-% IV SOLN
1000.0000 mg | Freq: Two times a day (BID) | INTRAVENOUS | Status: DC
  Filled 2015-11-23: qty 200

## 2015-11-23 MED ORDER — ONDANSETRON HCL 4 MG/2ML IJ SOLN: 4.0000 mg | Freq: Four times a day (QID) | INTRAMUSCULAR | Status: DC | PRN

## 2015-11-23 MED ORDER — PROPOFOL 10 MG/ML IV BOLUS
INTRAVENOUS | Status: AC
  Filled 2015-11-23: qty 20

## 2015-11-23 MED ORDER — METOCLOPRAMIDE HCL 5 MG PO TABS
5.0000 mg | ORAL_TABLET | Freq: Three times a day (TID) | ORAL | Status: DC | PRN
Start: 2015-11-23 — End: 2015-11-25

## 2015-11-23 MED ORDER — LACTATED RINGERS IV SOLN: INTRAVENOUS | Status: DC

## 2015-11-23 MED ORDER — HYDROCODONE-ACETAMINOPHEN 5-325 MG PO TABS
1.0000 | ORAL_TABLET | Freq: Four times a day (QID) | ORAL | Status: DC | PRN
  Administered 2015-11-24 – 2015-11-27 (×3): 1 via ORAL
  Administered 2015-11-28: 2 via ORAL
  Filled 2015-11-23 (×2): qty 1
  Filled 2015-11-23: qty 2
  Filled 2015-11-23: qty 1

## 2015-11-23 MED ORDER — ONDANSETRON HCL 4 MG PO TABS: 4.0000 mg | ORAL_TABLET | Freq: Four times a day (QID) | ORAL | Status: DC | PRN

## 2015-11-23 MED ORDER — POTASSIUM CHLORIDE IN NACL 20-0.9 MEQ/L-% IV SOLN
INTRAVENOUS | Status: DC
  Administered 2015-11-23 – 2015-11-25 (×3): via INTRAVENOUS
  Filled 2015-11-23 (×4): qty 1000

## 2015-11-23 MED ORDER — SODIUM CHLORIDE 0.9 % IV SOLN: Freq: Once | INTRAVENOUS | Status: DC

## 2015-11-23 MED ORDER — VANCOMYCIN HCL 1000 MG IV SOLR
1000.0000 mg | INTRAVENOUS | Status: DC | PRN
  Administered 2015-11-23: 1000 mg via INTRAVENOUS

## 2015-11-23 MED ORDER — MORPHINE SULFATE (PF) 2 MG/ML IV SOLN: 0.5000 mg | INTRAVENOUS | Status: DC | PRN

## 2015-11-23 MED ORDER — HYDROMORPHONE HCL 2 MG/ML IJ SOLN
INTRAMUSCULAR | Status: AC
  Filled 2015-11-23: qty 1

## 2015-11-23 MED ORDER — CHLORHEXIDINE GLUCONATE CLOTH 2 % EX PADS
6.0000 | MEDICATED_PAD | Freq: Every day | CUTANEOUS | Status: AC
  Administered 2015-11-23 – 2015-11-27 (×3): 6 via TOPICAL

## 2015-11-23 MED ORDER — PHENOL 1.4 % MT LIQD
1.0000 | OROMUCOSAL | Status: DC | PRN
  Filled 2015-11-23: qty 177

## 2015-11-23 MED ORDER — VANCOMYCIN HCL IN DEXTROSE 1-5 GM/200ML-% IV SOLN
INTRAVENOUS | Status: AC
  Filled 2015-11-23: qty 200

## 2015-11-23 MED ORDER — SODIUM CHLORIDE 0.9 % IV SOLN
Freq: Once | INTRAVENOUS | Status: DC
Start: 2015-11-23 — End: 2015-11-23

## 2015-11-23 MED ORDER — SUGAMMADEX SODIUM 200 MG/2ML IV SOLN
INTRAVENOUS | Status: DC | PRN
  Administered 2015-11-23: 140 mg via INTRAVENOUS

## 2015-11-23 MED ORDER — SUCCINYLCHOLINE CHLORIDE 20 MG/ML IJ SOLN
INTRAMUSCULAR | Status: DC | PRN
  Administered 2015-11-23: 100 mg via INTRAVENOUS

## 2015-11-23 MED ORDER — TRANEXAMIC ACID 1000 MG/10ML IV SOLN
2000.0000 mg | Freq: Once | INTRAVENOUS | Status: AC
  Administered 2015-11-23: 2000 mg via TOPICAL
  Filled 2015-11-23: qty 20

## 2015-11-23 MED ORDER — HYDROMORPHONE HCL 1 MG/ML IJ SOLN: 0.2500 mg | INTRAMUSCULAR | Status: DC | PRN

## 2015-11-23 MED ORDER — 0.9 % SODIUM CHLORIDE (POUR BTL) OPTIME
TOPICAL | Status: DC | PRN
  Administered 2015-11-23: 1000 mL

## 2015-11-23 MED ORDER — ENOXAPARIN SODIUM 30 MG/0.3ML ~~LOC~~ SOLN
30.0000 mg | SUBCUTANEOUS | Status: DC
  Administered 2015-11-24 – 2015-11-26 (×3): 30 mg via SUBCUTANEOUS
  Filled 2015-11-23 (×3): qty 0.3

## 2015-11-23 MED ORDER — ACETAMINOPHEN 325 MG PO TABS
650.0000 mg | ORAL_TABLET | Freq: Four times a day (QID) | ORAL | Status: DC | PRN
Start: 2015-11-23 — End: 2015-11-28
  Administered 2015-11-26: 650 mg via ORAL
  Filled 2015-11-23 (×2): qty 2

## 2015-11-23 MED ORDER — SUGAMMADEX SODIUM 200 MG/2ML IV SOLN
INTRAVENOUS | Status: AC
  Filled 2015-11-23: qty 2

## 2015-11-23 MED ORDER — LIDOCAINE HCL (CARDIAC) 20 MG/ML IV SOLN
INTRAVENOUS | Status: AC
  Filled 2015-11-23: qty 5

## 2015-11-23 MED ORDER — METOCLOPRAMIDE HCL 5 MG/ML IJ SOLN: 5.0000 mg | Freq: Three times a day (TID) | INTRAMUSCULAR | Status: DC | PRN

## 2015-11-23 MED ORDER — ONDANSETRON HCL 4 MG/2ML IJ SOLN
INTRAMUSCULAR | Status: DC | PRN
  Administered 2015-11-23: 4 mg via INTRAVENOUS

## 2015-11-23 MED ORDER — ONDANSETRON HCL 4 MG/2ML IJ SOLN
INTRAMUSCULAR | Status: AC
Start: 2015-11-23 — End: 2015-11-23
  Filled 2015-11-23: qty 2

## 2015-11-23 MED ORDER — FENTANYL CITRATE (PF) 250 MCG/5ML IJ SOLN
INTRAMUSCULAR | Status: AC
  Filled 2015-11-23: qty 5

## 2015-11-23 MED ORDER — ISOPROPYL ALCOHOL 70 % SOLN
Status: AC
  Filled 2015-11-23: qty 480

## 2015-11-23 MED ORDER — SODIUM CHLORIDE 0.9 % IV SOLN
Freq: Once | INTRAVENOUS | Status: AC
  Administered 2015-11-23: 14:00:00 via INTRAVENOUS

## 2015-11-23 MED ORDER — LACTATED RINGERS IV SOLN
INTRAVENOUS | Status: DC | PRN
  Administered 2015-11-23: 17:00:00 via INTRAVENOUS

## 2015-11-23 MED ORDER — HYDROMORPHONE HCL 1 MG/ML IJ SOLN
INTRAMUSCULAR | Status: DC | PRN
  Administered 2015-11-23: 0.5 mg via INTRAVENOUS
  Administered 2015-11-23: .5 mg via INTRAVENOUS

## 2015-11-23 MED ORDER — LIDOCAINE HCL (CARDIAC) 20 MG/ML IV SOLN
INTRAVENOUS | Status: DC | PRN
  Administered 2015-11-23: 60 mg via INTRAVENOUS

## 2015-11-23 MED ORDER — LACTATED RINGERS IV SOLN
INTRAVENOUS | Status: DC | PRN
  Administered 2015-11-23: 18:00:00 via INTRAVENOUS

## 2015-11-23 SURGICAL SUPPLY — 38 items
BAG SPEC THK2 15X12 ZIP CLS (MISCELLANEOUS)
BAG ZIPLOCK 12X15 (MISCELLANEOUS) IMPLANT
BIT DRILL SHORT 4.0 (BIT) ×2 IMPLANT
BNDG GAUZE ELAST 4 BULKY (GAUZE/BANDAGES/DRESSINGS) ×3 IMPLANT
COVER MAYO STAND STRL (DRAPES) ×3 IMPLANT
COVER PERINEAL POST (MISCELLANEOUS) ×3 IMPLANT
DRAPE INCISE IOBAN 66X45 STRL (DRAPES) ×3 IMPLANT
DRAPE STERI IOBAN 125X83 (DRAPES) ×3 IMPLANT
DRESSING AQUACEL AG SP 3.5X4 (GAUZE/BANDAGES/DRESSINGS) ×2 IMPLANT
DRESSING AQUACEL AG SP 3.5X6 (GAUZE/BANDAGES/DRESSINGS) ×1 IMPLANT
DRILL BIT SHORT 4.0 (BIT) ×6
DRSG AQUACEL AG SP 3.5X4 (GAUZE/BANDAGES/DRESSINGS) ×6
DRSG AQUACEL AG SP 3.5X6 (GAUZE/BANDAGES/DRESSINGS) ×3
DRSG PAD ABDOMINAL 8X10 ST (GAUZE/BANDAGES/DRESSINGS) ×3 IMPLANT
DRSG TEGADERM 4X4.75 (GAUZE/BANDAGES/DRESSINGS) ×3 IMPLANT
DURAPREP 26ML APPLICATOR (WOUND CARE) ×3 IMPLANT
ELECT REM PT RETURN 9FT ADLT (ELECTROSURGICAL) ×3
ELECTRODE REM PT RTRN 9FT ADLT (ELECTROSURGICAL) ×1 IMPLANT
GAUZE SPONGE 4X4 12PLY STRL (GAUZE/BANDAGES/DRESSINGS) ×3 IMPLANT
GAUZE XEROFORM 1X8 LF (GAUZE/BANDAGES/DRESSINGS) ×3 IMPLANT
GLOVE SURG ORTHO 8.0 STRL STRW (GLOVE) ×3 IMPLANT
GOWN STRL REUS W/TWL LRG LVL3 (GOWN DISPOSABLE) ×3 IMPLANT
GUIDE PIN 3.2X343 (PIN) ×3
GUIDE PIN 3.2X343MM (PIN) ×9
KIT BASIN OR (CUSTOM PROCEDURE TRAY) ×3 IMPLANT
NAIL LEFT 10X36 (Nail) ×3 IMPLANT
PACK GENERAL/GYN (CUSTOM PROCEDURE TRAY) ×3 IMPLANT
PIN GUIDE 3.2X343MM (PIN) ×3 IMPLANT
POSITIONER SURGICAL ARM (MISCELLANEOUS) ×3 IMPLANT
SCREW LAG COMPR KIT 95/90 (Screw) ×3 IMPLANT
SCREW TRIGEN LOW PROF 5.0X35 (Screw) ×3 IMPLANT
SCREW TRIGEN LOW PROF 5.0X40 (Screw) ×3 IMPLANT
SPONGE LAP 4X18 X RAY DECT (DISPOSABLE) ×3 IMPLANT
STAPLER VISISTAT (STAPLE) ×3 IMPLANT
SUT VIC AB 2-0 CT1 27 (SUTURE) ×6
SUT VIC AB 2-0 CT1 TAPERPNT 27 (SUTURE) ×2 IMPLANT
SUT VICRYL 0 UR6 27IN ABS (SUTURE) ×9 IMPLANT
TOWEL OR 17X26 10 PK STRL BLUE (TOWEL DISPOSABLE) ×3 IMPLANT

## 2015-11-23 NOTE — Anesthesia Procedure Notes (Signed)
Procedure Name: Intubation Date/Time: 11/23/2015 5:53 PM Performed by: Carleene Cooper A Pre-anesthesia Checklist: Patient identified, Emergency Drugs available, Patient being monitored, Suction available and Timeout performed Patient Re-evaluated:Patient Re-evaluated prior to inductionOxygen Delivery Method: Circle system utilized Preoxygenation: Pre-oxygenation with 100% oxygen Intubation Type: IV induction Ventilation: Mask ventilation without difficulty Laryngoscope Size: Mac and 4 Grade View: Grade I Tube type: Oral Tube size: 7.5 mm Number of attempts: 1 Airway Equipment and Method: Stylet Placement Confirmation: ETT inserted through vocal cords under direct vision,  positive ETCO2 and breath sounds checked- equal and bilateral Secured at: 21 cm Tube secured with: Tape Dental Injury: Teeth and Oropharynx as per pre-operative assessment  Comments: Intubation by Dorthea Cove, CRNA

## 2015-11-23 NOTE — Brief Op Note (Signed)
11/21/2015 - 11/23/2015  8:14 PM  PATIENT:  Shannon [name]  82 y.o. female  PRE-OPERATIVE DIAGNOSIS:  left hip intertrochanteric femur fracture  POST-OPERATIVE DIAGNOSIS:  left hip intertrochanteric femur fracture  PROCEDURE:  Procedure(s): INTRAMEDULLARY (IM) NAIL INTERTROCHANTRIC LEFT HIP  SURGEON:  Surgeon(s): Meredith Pel, MD  ASSISTANT: none  ANESTHESIA:   general  EBL: 200 ml    Total I/O In: 335 [Blood:335] Out: 350 [Urine:200; Blood:150]  BLOOD ADMINISTERED: none  DRAINS: none   LOCAL MEDICATIONS USED:  none  SPECIMEN:  No Specimen  COUNTS:  YES  TOURNIQUET:  * No tourniquets in log *  DICTATION: .Other Dictation: Dictation Number [id number]  PLAN OF CARE: Admit to inpatient   PATIENT DISPOSITION:  PACU - hemodynamically stable              11/21/2015 - 11/23/2015  8:14 PM  PATIENT:  Shannon [name]  82 y.o. female  PRE-OPERATIVE DIAGNOSIS:  left hip intertrochanteric femur fracture  POST-OPERATIVE DIAGNOSIS:  left hip intertrochanteric femur fracture  PROCEDURE:  Procedure(s): INTRAMEDULLARY (IM) NAIL INTERTROCHANTRIC LEFT HIP  SURGEON:  Surgeon(s): Meredith Pel, MD  ASSISTANT: none  ANESTHESIA:   general  EBL: 200 ml    Total I/O In: 335 [Blood:335] Out: 350 [Urine:200; Blood:150]  BLOOD ADMINISTERED: none  DRAINS: none   LOCAL MEDICATIONS USED:  none  SPECIMEN:  No Specimen  COUNTS:  YES  TOURNIQUET:  * No tourniquets in log *  DICTATION: .Other Dictation: Dictation Number done  PLAN OF CARE: Admit to inpatient   PATIENT DISPOSITION:  PACU - hemodynamically stable

## 2015-11-23 NOTE — Anesthesia Preprocedure Evaluation (Addendum)
Anesthesia Evaluation  Patient identified by MRN, date of birth, ID band Patient confused  General Assessment Comment:HOH, mildly confused  Reviewed: Allergy & Precautions, NPO status , Patient's Chart, lab work & pertinent test results  History of Anesthesia Complications Negative for: history of anesthetic complications  Airway Mallampati: I  TM Distance: >3 FB Neck ROM: Full    Dental no notable dental hx. (+) Dental Advisory Given   Pulmonary former smoker,    Pulmonary exam normal        Cardiovascular hypertension, Normal cardiovascular exam+ dysrhythmias Atrial Fibrillation  Rhythm:Regular Rate:Normal  Study Conclusions  - Left ventricle: The cavity size was normal. Systolic function was normal. The estimated ejection fraction was in the range of 60% to 65%. Regional wall motion abnormalities cannot be excluded. - Left atrium: The atrium was mildly dilated.    Neuro/Psych dementia negative psych ROS   GI/Hepatic GERD  ,  Endo/Other  Hypothyroidism   Renal/GU      Musculoskeletal   Abdominal   Peds  Hematology On Warfarin   Anesthesia Other Findings   Reproductive/Obstetrics                         Anesthesia Physical  Anesthesia Plan  ASA: III  Anesthesia Plan: General   Post-op Pain Management:    Induction: Intravenous  Airway Management Planned: Oral ETT  Additional Equipment:   Intra-op Plan:   Post-operative Plan: Extubation in OR  Informed Consent: I have reviewed the patients History and Physical, chart, labs and discussed the procedure including the risks, benefits and alternatives for the proposed anesthesia with the patient or authorized representative who has indicated his/her understanding and acceptance.   Dental advisory given and Consent reviewed with POA  Plan Discussed with: CRNA and Surgeon  Anesthesia Plan Comments:        Anesthesia Quick  Evaluation

## 2015-11-23 NOTE — Progress Notes (Signed)
Pharmacy Antibiotic Note  Shannon [name] is a 82 y.o. female admitted on 11/21/2015 s/p mechanical fall, found to have left hip intertrochanteric fracture. Patient to undergo surgical fixation today. Pharmacy has been consulted for Vancomycin dosing for surgical prophylaxis. Confirmed with Dr. Marlou Sa that patient to be ordered 1 dose pre-op and dose post-op to cover 24 hour period.  Plan: Vancomycin 1g IV x 1 pre-op (already ordered by CRNA). Give additional 1g IV x 1 post-op on 7/22 at 1300.  Height: 5\' 5"  (165.1 cm) Weight: 155 lb (70.308 kg) IBW/kg (Calculated) : 57  Temp (24hrs), Avg:99.8 F (37.7 C), Min:98.9 F (37.2 C), Max:100.7 F (38.2 C)   Recent Labs Lab 11/21/15 1958 11/22/15 0500 11/23/15 0426  WBC 13.0* 9.0 9.4  CREATININE 0.77 0.62  --     Estimated Creatinine Clearance: 46.9 mL/min (by C-G formula based on Cr of 0.62).    Allergies  Allergen Reactions  . Demerol Nausea And Vomiting  . Keflet [Cephalexin] Nausea And Vomiting  . Sulfa Antibiotics Nausea And Vomiting  . Tequin Nausea Only  . Penicillins Rash    Antimicrobials this admission: 7/21 >> Vancomycin peri-operatively >> 7/22  Dose adjustments this admission: --  Microbiology results: 7/21 MRSA PCR: positive   Thank you for allowing pharmacy to be a part of this patient's care. Pharmacy will sign off at this time. Please re-consult if needed.   Lindell Spar, PharmD, BCPS Pager: [phone] 11/23/2015 6:14 PM

## 2015-11-23 NOTE — Transfer of Care (Signed)
Immediate Anesthesia Transfer of Care Note  Patient: Shannon [name]  Procedure(s) Performed: Procedure(s): INTRAMEDULLARY (IM) NAIL INTERTROCHANTRIC LEFT HIP (Left)  Patient Location: PACU  Anesthesia Type:General  Level of Consciousness:  sedated, patient cooperative and responds to stimulation  Airway & Oxygen Therapy:Patient Spontanous Breathing and Patient connected to face mask oxgen  Post-op Assessment:  Report given to PACU RN and Post -op Vital signs reviewed and stable  Post vital signs:  Reviewed and stable  Last Vitals:  Filed Vitals:   11/23/15 1434 11/23/15 1556  BP: 119/54 119/46  Pulse: 98 95  Temp: 37.7 C 37.6 C  Resp: 17 16    Complications: No apparent anesthesia complications

## 2015-11-23 NOTE — Progress Notes (Signed)
PROGRESS NOTE  Shannon [name]  [id number] DOB: 1924/05/10  DOA: 11/21/2015 PCP: [name], MD   Brief Narrative:  82 year old female, PMH of dementia, paroxysmal A. fib on chronic Coumadin anticoagulation, GERD, HTN, hypothyroid, breast cancer status post XRT, HLD, presented to [facility] ED on 7/20 following accidental fall at home/? ALF and noted to have comminuted angulated intertrochanteric fracture of the left femur. Orthopedics was consulted and planned surgical fixation on 7/21 pending improvement in INR.   Assessment & Plan:   Principal Problem:   Hip fracture ([place]) Active Problems:   A-fib (HCC)   Warfarin-induced coagulopathy (HCC)   Dementia   Left hip fracture - For likely surgery later today. INR is on downward trend. Patient is NPO. - Admitted to medical floor and orthopedics was consulted. - Based on available data, patient is at low risk for perioperative CV events. May proceed with indicated surgery with close perioperative monitoring without any further cardiac workup. - INR 2.35 on 7/19 increased to 2.63 on 7/20. Repeat vitamin K and recheck INR in a.m. - As per orthopedics, considering surgery on 7/21.  Paroxysmal atrial fibrillation - Surgery to advise when it is okay to resume anticoagulation. - Currently in sinus rhythm. Continue propafenone on and diltiazem. Coumadin held and INR being reversed for surgery. Repeat her dose of vitamin K 5 mg 1. - EKG 7/20: Normal sinus rhythm, LAD, LAFB, nonspecific T-wave abnormalities and no acute changes. QTC 459 ms - LVEF 09/20/15: 60-65 percent. - CHADS-VASC score of 4 - Resume Coumadin postop when okay with orthopedics.  Hypothyroid - Continue Synthroid  Hyperlipidemia - Continue statin  GERD -Continue PPI   Dementia - Mental status possibly at baseline. No agitation.  Anemia - Follow CBCs.   DVT prophylaxis:  SCD's Code Status: Full  Family Communication: None at bedside. Discussed  with patient.  Disposition Plan: To be determined. Likely to SNF when medically stable.   Consultants:   Orthopedics   Procedures:   None  Antimicrobials:   None    Subjective: Left hip pain on movement. Patient states that she lives at home, ambulates without support and even climbs a flight of stairs. Denies any chest pain, dyspnea or palpitations, dizziness or lightheadedness. No syncopal episode reported. Denies MI, CAD or CHF.  Objective:  Filed Vitals:   11/22/15 0505 11/22/15 1412 11/22/15 2144 11/23/15 0525  BP: 125/76 110/55 134/67 108/51  Pulse: 96 90 99 91  Temp: 98.8 F (37.1 C) 98.8 F (37.1 C) 99.9 F (37.7 C) 100.7 F (38.2 C)  TempSrc: Oral Axillary Oral Oral  Resp: 19 17 19 19   Height:      Weight:      SpO2: 98% 93% 96% 94%    Intake/Output Summary (Last 24 hours) at 11/23/15 1030 Last data filed at 11/23/15 0600  Gross per 24 hour  Intake    920 ml  Output    560 ml  Net    360 ml   Filed Weights   11/21/15 2235  Weight: 70.308 kg (155 lb)    Examination:  General exam: Pleasant elderly female lying comfortably supine in bed.  Respiratory system: Clear to auscultation. Respiratory effort normal. Cardiovascular system: S1 & S2 heard, RRR. No JVD, murmurs, rubs, gallops or clicks. No pedal edema. Gastrointestinal system: Abdomen is nondistended, soft and nontender. No organomegaly or masses felt. Normal bowel sounds heard. Central nervous system: Alert and oriented 2. No focal neurological deficits. Extremities: Symmetric 5 x 5 power  In all limbs except left lower extremity with assessment limited by pain. Left lower extremity externally rotated  Skin: No rashes, lesions or ulcers Psychiatry: Judgement and insight appear normal. Mood & affect appropriate.     Data Reviewed: I have personally reviewed following labs and imaging studies  CBC:  Recent Labs Lab 11/21/15 1958 11/22/15 0500 11/23/15 0426  WBC 13.0* 9.0 9.4    NEUTROABS 10.9*  --   --   HGB 12.3 10.2* 7.8*  HCT 37.3 31.0* 23.9*  MCV 93.3 94.2 95.2  PLT 227 190 [id number]*   Basic Metabolic Panel:  Recent Labs Lab 11/21/15 1958 11/22/15 0500  NA 139 138  K 3.8 4.2  CL 107 107  CO2 26 26  GLUCOSE 141* 154*  BUN 20 16  CREATININE 0.77 0.62  CALCIUM 9.8 9.4   GFR: Estimated Creatinine Clearance: 46.9 mL/min (by C-G formula based on Cr of 0.62). Liver Function Tests:  Recent Labs Lab 11/22/15 0500  AST 20  ALT 16  ALKPHOS 63  BILITOT 0.8  PROT 5.8*  ALBUMIN 3.2*   No results for input(s): LIPASE, AMYLASE in the last 168 hours. No results for input(s): AMMONIA in the last 168 hours. Coagulation Profile:  Recent Labs Lab 11/21/15 1958 11/22/15 0500 11/23/15 0426  INR 2.35* 2.63* 2.14*   Cardiac Enzymes: No results for input(s): CKTOTAL, CKMB, CKMBINDEX, TROPONINI in the last 168 hours. BNP (last 3 results) No results for input(s): PROBNP in the last 8760 hours. HbA1C: No results for input(s): HGBA1C in the last 72 hours. CBG: No results for input(s): GLUCAP in the last 168 hours. Lipid Profile: No results for input(s): CHOL, HDL, LDLCALC, TRIG, CHOLHDL, LDLDIRECT in the last 72 hours. Thyroid Function Tests: No results for input(s): TSH, T4TOTAL, FREET4, T3FREE, THYROIDAB in the last 72 hours. Anemia Panel: No results for input(s): VITAMINB12, FOLATE, FERRITIN, TIBC, IRON, RETICCTPCT in the last 72 hours.  Sepsis Labs: No results for input(s): PROCALCITON, LATICACIDVEN in the last 168 hours.  Recent Results (from the past 240 hour(s))  Surgical PCR screen     Status: Abnormal   Collection Time: 11/23/15  4:35 AM  Result Value Ref Range Status   MRSA, PCR POSITIVE (A) NEGATIVE Final    Comment: RESULT CALLED TO, READ BACK BY AND VERIFIED WITH: K. SILK RN AT 0945 ON 07.21.17 BY SHUEA    Staphylococcus aureus POSITIVE (A) NEGATIVE Final    Comment:        The Xpert SA Assay (FDA approved for NASAL specimens in  patients over 3 years of age), is one component of a comprehensive surveillance program.  Test performance has been validated by [facility] for patients greater than or equal to 22 year old. It is not intended to diagnose infection nor to guide or monitor treatment.          Radiology Studies: Dg Chest 1 View  11/21/2015  CLINICAL DATA:  82 y/o F; left hip fracture. History of hypertension and atrial fibrillation. Ex-smoker. EXAM: CHEST 1 VIEW COMPARISON:  Chest radiograph dated 09/19/2015. FINDINGS: The heart size and mediastinal contours are within normal limits. Both lungs are clear. The visualized skeletal structures are unremarkable. IMPRESSION: No active disease. Electronically Signed   By: Kristine Garbe M.D.   On: 11/21/2015 19:51   Dg Hip Unilat With Pelvis 2-3 Views Left  11/21/2015  CLINICAL DATA:  Fall today out of wheelchair. Left hip pain with external rotation. EXAM: DG HIP (WITH OR WITHOUT PELVIS) 2-3V  LEFT COMPARISON:  Pelvis and right hip radiographs 09/23/2014 FINDINGS: Comminuted angulated intertrochanteric fracture left femur. There is displacement and proximal migration of the femoral shaft. Femoral head remains seated in the acetabulum. Post ORIF right hip intertrochanteric fracture. Probable healed fracture of the right inferior pubic ramus. The bones are under mineralized. IMPRESSION: Comminuted intertrochanteric left hip fracture. Electronically Signed   By: Jeb Levering M.D.   On: 11/21/2015 19:53        Scheduled Meds: . atorvastatin  10 mg Oral QHS  . Chlorhexidine Gluconate Cloth  6 each Topical Q0600  . diltiazem  180 mg Oral Daily  . docusate sodium  100 mg Oral BID  . feeding supplement (ENSURE ENLIVE)  237 mL Oral BID BM  . levothyroxine  112 mcg Oral QAC breakfast  . mirabegron ER  50 mg Oral Daily  . multivitamin with minerals  1 tablet Oral Daily  . mupirocin ointment  1 application Nasal BID  . ondansetron  4 mg Intravenous  Once  . pantoprazole  40 mg Oral Daily  . polyethylene glycol  17 g Oral Daily  . propafenone  150 mg Oral TID  . sertraline  25 mg Oral Daily   Continuous Infusions: . sodium chloride 50 mL/hr at 11/23/15 0600     LOS: 2 days    Time spent: 40 minutes.    Bonnell Public, MD [facility] Pager [phone].  If 7PM-7AM, please contact night-coverage [email or web address] Password [email or web address] 11/23/2015, 10:30 AM

## 2015-11-24 ENCOUNTER — Inpatient Hospital Stay (HOSPITAL_COMMUNITY): Payer: Medicare Other

## 2015-11-24 LAB — CBC
HEMATOCRIT: 29.2 % — AB (ref 36.0–46.0)
HEMOGLOBIN: 9.8 g/dL — AB (ref 12.0–15.0)
MCH: 30.5 pg (ref 26.0–34.0)
MCHC: 33.6 g/dL (ref 30.0–36.0)
MCV: 91 fL (ref 78.0–100.0)
Platelets: 136 10*3/uL — ABNORMAL LOW (ref 150–400)
RBC: 3.21 MIL/uL — ABNORMAL LOW (ref 3.87–5.11)
RDW: 16.1 % — ABNORMAL HIGH (ref 11.5–15.5)
WBC: 10.5 10*3/uL (ref 4.0–10.5)

## 2015-11-24 LAB — BASIC METABOLIC PANEL
ANION GAP: 4 — AB (ref 5–15)
BUN: 10 mg/dL (ref 6–20)
CALCIUM: 8.7 mg/dL — AB (ref 8.9–10.3)
CHLORIDE: 105 mmol/L (ref 101–111)
CO2: 29 mmol/L (ref 22–32)
Creatinine, Ser: 0.48 mg/dL (ref 0.44–1.00)
GFR calc non Af Amer: >60 mL/min (ref >60)
Glucose, Bld: 122 mg/dL — ABNORMAL HIGH (ref 65–99)
Potassium: 3.6 mmol/L (ref 3.5–5.1)
SODIUM: 138 mmol/L (ref 135–145)

## 2015-11-24 LAB — PROTIME-INR
INR: 1.39 (ref 0.00–1.49)
Prothrombin Time: 17.1 seconds — ABNORMAL HIGH (ref 11.6–15.2)

## 2015-11-24 MED ORDER — LIP MEDEX EX OINT
TOPICAL_OINTMENT | CUTANEOUS | Status: AC
  Filled 2015-11-24: qty 7

## 2015-11-24 MED ORDER — FUROSEMIDE 10 MG/ML IJ SOLN
20.0000 mg | Freq: Once | INTRAMUSCULAR | Status: AC
  Administered 2015-11-24: 20 mg via INTRAVENOUS
  Filled 2015-11-24: qty 2

## 2015-11-24 MED ORDER — DEXTROSE 5 % IV SOLN
1.0000 g | INTRAVENOUS | Status: DC
  Administered 2015-11-24 – 2015-11-27 (×4): 1 g via INTRAVENOUS
  Filled 2015-11-24 (×5): qty 10

## 2015-11-24 NOTE — Progress Notes (Signed)
Subjective: Pt stablr - pain ok   Objective: Vital signs in last 24 hours: Temp:  [98.2 F (36.8 C)-100.3 F (37.9 C)] 100.3 F (37.9 C) (07/22 0546) Pulse Rate:  [89-106] 100 (07/22 0546) Resp:  [14-19] 16 (07/22 0546) BP: (110-157)/(46-88) 110/72 mmHg (07/22 0546) SpO2:  [95 %-100 %] 98 % (07/22 0546)  Intake/Output from previous day: 07/21 0701 - 07/22 0700 In: 3305.8 [I.V.:1915.8; Blood:1140; IV Piggyback:250] Out: 1600 [Urine:1400; Blood:200] Intake/Output this shift:    Exam:  Dorsiflexion/Plantar flexion intact  Labs:  Recent Labs  11/21/15 1958 11/22/15 0500 11/23/15 0426 11/24/15 0442  HGB 12.3 10.2* 7.8* 9.8*    Recent Labs  11/23/15 0426 11/24/15 0442  WBC 9.4 10.5  RBC 2.51* 3.21*  HCT 23.9* 29.2*  PLT 142* 136*    Recent Labs  11/22/15 0500 11/24/15 0442  NA 138 138  K 4.2 3.6  CL 107 105  CO2 26 29  BUN 16 10  CREATININE 0.62 0.48  GLUCOSE 154* 122*  CALCIUM 9.4 8.7*    Recent Labs  11/23/15 0426 11/24/15 0442  INR 2.14* 1.39    Assessment/Plan: Plan oob to chair - snf next week - on lovenox   Shannon [name] 11/24/2015, 8:55 AM

## 2015-11-24 NOTE — Evaluation (Signed)
Physical Therapy Evaluation Patient Details Name: Shannon [name] MRN: [id number] DOB: 07-30-1924 Today's Date: 11/24/2015   History of Present Illness  Shannon [name] is a 82 y.o. woman with a history of dementia, paroxysmal atrial fibrillation , HTN, and breast cancer who presents to the ED 11/21/15 for evaluation of hip pain after having an accidental fall out of [place] at  [facility]. sustained R intertrochanteric femur/hip fracture. s/P ORIF with IM nail.  Clinical Impression  The patient was assisted to sitting at the edge of the bed. Noted listing to The right heavily. Also coughing and suctioned  Ensure from back of mouth that she had just  Taken in. RN aware. The patient currently may need  A mechanical lift for OOB due to NWB status.Pt admitted with above diagnosis. Pt currently with functional limitations due to the deficits listed below (see PT Problem List).  Pt will benefit from skilled PT to increase their independence and safety with mobility to allow discharge to the venue listed below.       Follow Up Recommendations SNF;Supervision/Assistance - 24 hour    Equipment Recommendations  None recommended by PT    Recommendations for Other Services       Precautions / Restrictions Precautions Precautions: Fall Restrictions LLE Weight Bearing: Non weight bearing      Mobility  Bed Mobility Overal bed mobility: Needs Assistance;+2 for physical assistance;+ 2 for safety/equipment Bed Mobility: Rolling;Supine to Sit;Sit to Supine Rolling: Total assist;+2 for physical assistance;+2 for safety/equipment   Supine to sit: Total assist;+2 for physical assistance;+2 for safety/equipment Sit to supine: Total assist;+2 for physical assistance;+2 for safety/equipment   General bed mobility comments: requires assistance to  rll, used bed pad and roll under the feet to slide patient around to sitting up to bed edge and then total to return to supine  Transfers                  General transfer comment: NT-needs a lift for OOB  Ambulation/Gait                Stairs            Wheelchair Mobility    Modified Rankin (Stroke Patients Only)       Balance Overall balance assessment: Needs assistance;History of Falls Sitting-balance support: Feet supported;Bilateral upper extremity supported Sitting balance-Leahy Scale: Poor   Postural control: Right lateral lean                                   Pertinent Vitals/Pain Pain Assessment: Faces Faces Pain Scale: Hurts even more Pain Location: R hip Pain Descriptors / Indicators: Aching;Discomfort;Grimacing Pain Intervention(s): Repositioned;Premedicated before session;Ice applied    Home Living Family/patient expects to be discharged to:: Skilled nursing facility                      Prior Function Level of Independence: Needs assistance   Gait / Transfers Assistance Needed: WC dependent           Hand Dominance        Extremity/Trunk Assessment   Upper Extremity Assessment: Generalized weakness           Lower Extremity Assessment: RLE deficits/detail;LLE deficits/detail RLE Deficits / Details: edema of thigh, needs assistance to move the leg LLE Deficits / Details: moves the leg in the bed     Communication  Cognition Arousal/Alertness: Awake/alert Behavior During Therapy: WFL for tasks assessed/performed Overall Cognitive Status: History of cognitive impairments - at baseline                      General Comments      Exercises        Assessment/Plan    PT Assessment Patient needs continued PT services  PT Diagnosis Difficulty walking;Acute pain;Generalized weakness;Altered mental status   PT Problem List Decreased strength;Decreased range of motion;Decreased knowledge of precautions;Decreased activity tolerance;Decreased balance;Decreased mobility;Decreased cognition;Decreased safety awareness;Decreased knowledge of use  of DME;Pain  PT Treatment Interventions Functional mobility training;Therapeutic exercise;Therapeutic activities;Patient/family education   PT Goals (Current goals can be found in the Care Plan section) Acute Rehab PT Goals Patient Stated Goal: unable PT Goal Formulation: Patient unable to participate in goal setting Time For Goal Achievement: 12/08/15 Potential to Achieve Goals: Fair    Frequency Min 2X/week   Barriers to discharge Decreased caregiver support      Co-evaluation               End of Session   Activity Tolerance: Patient limited by fatigue;Patient limited by pain Patient left: in bed;with call bell/phone within reach;with bed alarm set Nurse Communication: Mobility status;Need for lift equipment         Time: 1209-1228 PT Time Calculation (min) (ACUTE ONLY): 19 min   Charges:   PT Evaluation $PT Eval Moderate Complexity: 1 Procedure     PT G Codes:        Claretha Cooper 11/24/2015, 3:57 PM Tresa Endo PT [phone]

## 2015-11-24 NOTE — Progress Notes (Signed)
Pt presents with coarse bilateral lung crackles after breakfast. O2 sat 77 on RA and 3L O2 via [place] applied. O2 sat now 94. MD paged. New order for IV lasix 20mg  IV x1. Will continue to monitor.

## 2015-11-24 NOTE — Progress Notes (Signed)
Pt continues to choke on fluids causing need for bedside suction. MD notified. New order for CXR and speech eval obtained. CXR clear. Will continue to monitor.

## 2015-11-24 NOTE — Anesthesia Postprocedure Evaluation (Signed)
Anesthesia Post Note  Patient: Shannon [name]  Procedure(s) Performed: Procedure(s) (LRB): INTRAMEDULLARY (IM) NAIL INTERTROCHANTRIC LEFT HIP (Left)  Patient location during evaluation: PACU Anesthesia Type: General Level of consciousness: sedated Pain management: pain level controlled Vital Signs Assessment: post-procedure vital signs reviewed and stable Respiratory status: spontaneous breathing and respiratory function stable Cardiovascular status: stable Anesthetic complications: no    Last Vitals:  Filed Vitals:   11/24/15 0340 11/24/15 0546  BP: 153/72 110/72  Pulse: 106 100  Temp: 37.6 C 37.9 C  Resp: 15 16    Last Pain:  Filed Vitals:   11/24/15 0803  PainSc: [place]

## 2015-11-24 NOTE — Progress Notes (Signed)
Son told RN that pt is usually wheelchair bound and not to "expect much during therapy". Will report to oncoming nurse.

## 2015-11-24 NOTE — Op Note (Signed)
NAMEMarland Kitchen  Shannon [name], Shannon [name] NO.:  [id number]  MEDICAL RECORD NO.:  [id number]  LOCATION:  [id number]                         FACILITY:  [facility]  PHYSICIAN:  Anderson Malta, M.D.    DATE OF BIRTH:  [birth year]  DATE OF PROCEDURE: DATE OF DISCHARGE:                              OPERATIVE REPORT   PREOPERATIVE DIAGNOSIS:  Left hip intertrochanteric, subtrochanteric femur fracture.  POSTOPERATIVE DIAGNOSIS:  Left hip intertrochanteric, subtrochanteric femur fracture.  PROCEDURE:  Left hip intertrochanteric, subtrochanteric femur fracture open reduction and internal fixation with intramedullary hip screw, Bland Span and Nephew 10 x 36 with two distal interlocks.  SURGEON:  Anderson Malta, M.D.  ASSISTANT:  None.  ANESTHESIA:  General.  INDICATIONS:  Shannon [name] is an ambulatory female, left hip fracture, presents for operative management after explanation of risks and benefits.  PROCEDURE IN DETAIL:  The patient was brought to the operating room where general anesthetic was induced.  Preoperative antibiotics were administered.  Time-out was called.  Left leg was prescrubbed with alcohol and Betadine, allowed to air dry, prepped with DuraPrep solution and draped in a sterile manner.  [name] was used to cover the operative field.  Under fluoroscopic guidance, the leg was reduced with traction and some leg flexion.  Once the good reduction was achieved, an incision was made 4 fingerbreadths proximal to the trochanteric tip.  Skin and subcutaneous tissue were sharply divided.  Guidepin placed into the proximal femur.  Fracture was reduced.  Proximal reaming performed. Nail placed.  Proximal compression screws placed x2 in standard fashion. Tip apex distance looked good.  At this time, two distal interlocks were placed using a perfect circle technique with good fixation achieved. The reason for this was the potential instability in the subtrochanteric region.  Good compression was  achieved at the fracture site.  All three incisions irrigated, closed using 0 Vicryl, 2-0 Vicryl and staples. Aquacel dressing was placed.  The patient tolerated the procedure well without immediate complications and transferred to the recovery room in stable condition.     Anderson Malta, M.D.    GSD/MEDQ  D:  11/23/2015  T:  11/24/2015  Job:  [id number]

## 2015-11-24 NOTE — Progress Notes (Signed)
PROGRESS NOTE  Shannon [name]  [id number] DOB: 16-Jan-1925  DOA: 11/21/2015 PCP: [name], MD   Brief Narrative:  82 year old female, PMH of dementia, paroxysmal A. fib on chronic Coumadin anticoagulation, GERD, HTN, hypothyroid, breast cancer status post XRT, HLD, presented to [facility] ED on 7/20 following accidental fall at home/? ALF and noted to have comminuted angulated intertrochanteric fracture of the left femur. Orthopedics was consulted and planned surgical fixation on 7/21 pending improvement in INR.   Assessment & Plan:   Principal Problem:   Hip fracture ([place]) Active Problems:   A-fib (HCC)   Warfarin-induced coagulopathy (HCC)   Dementia   Left hip fracture - S/P surgery. Ortho is directing care. On S/C lovenox. Paroxysmal atrial fibrillation - Surgery to advise when it is okay to resume full anticoagulation. - Currently in sinus rhythm. Continue propafenone on and diltiazem. Coumadin held and INR being reversed for surgery. Repeat her dose of vitamin K 5 mg 1. - EKG 7/20: Normal sinus rhythm, LAD, LAFB, nonspecific T-wave abnormalities and no acute changes. QTC 459 ms - LVEF 09/20/15: 60-65 percent. - CHADS-VASC score of 4 - Resume Coumadin postop when okay with orthopedics.  Hypothyroid - Continue Synthroid  Hyperlipidemia - Continue statin  GERD -Continue PPI   Dementia - Mental status possibly at baseline. No agitation.  Anemia - Follow CBCs.   DVT prophylaxis:  SCD's Code Status: Full  Family Communication: None at bedside. Discussed with patient.  Disposition Plan: To be determined. Likely to SNF when medically stable.   Consultants:   Orthopedics   Procedures:   None  Antimicrobials:   None    Subjective: Left hip pain on movement. Patient states that she lives at home, ambulates without support and even climbs a flight of stairs. Denies any chest pain, dyspnea or palpitations, dizziness or lightheadedness. No  syncopal episode reported. Denies MI, CAD or CHF.  Objective:  Filed Vitals:   11/24/15 0025 11/24/15 0340 11/24/15 0546 11/24/15 0900  BP: 145/73 153/72 110/72 124/65  Pulse: 99 106 100 100  Temp: 98.2 F (36.8 C) 99.6 F (37.6 C) 100.3 F (37.9 C) 100.8 F (38.2 C)  TempSrc: Oral Oral Oral Oral  Resp: 14 15 16 18   Height:      Weight:      SpO2: 100% 100% 98% 92%    Intake/Output Summary (Last 24 hours) at 11/24/15 1011 Last data filed at 11/24/15 0914  Gross per 24 hour  Intake 3305.83 ml  Output   1750 ml  Net 1555.83 ml   Filed Weights   11/21/15 2235  Weight: 70.308 kg (155 lb)    Examination:  General exam: Pleasant elderly female lying comfortably supine in bed.  Respiratory system: Clear to auscultation. Respiratory effort normal. Cardiovascular system: S1 & S2 heard, RRR. No JVD, murmurs, rubs, gallops or clicks. No pedal edema. Gastrointestinal system: Abdomen is nondistended, soft and nontender. No organomegaly or masses felt. Normal bowel sounds heard. Central nervous system: Alert and oriented 2. No focal neurological deficits. Extremities: Symmetric 5 x 5 power In all limbs except left lower extremity with assessment limited by pain. Left lower extremity externally rotated  Skin: No rashes, lesions or ulcers Psychiatry: Judgement and insight appear normal. Mood & affect appropriate.     Data Reviewed: I have personally reviewed following labs and imaging studies  CBC:  Recent Labs Lab 11/21/15 1958 11/22/15 0500 11/23/15 0426 11/24/15 0442  WBC 13.0* 9.0 9.4 10.5  NEUTROABS 10.9*  --   --   --  HGB 12.3 10.2* 7.8* 9.8*  HCT 37.3 31.0* 23.9* 29.2*  MCV 93.3 94.2 95.2 91.0  PLT 227 190 142* [id number]*   Basic Metabolic Panel:  Recent Labs Lab 11/21/15 1958 11/22/15 0500 11/24/15 0442  NA 139 138 138  K 3.8 4.2 3.6  CL 107 107 105  CO2 26 26 29   GLUCOSE 141* 154* 122*  BUN 20 16 10   CREATININE 0.77 0.62 0.48  CALCIUM 9.8 9.4 8.7*    GFR: Estimated Creatinine Clearance: 46.9 mL/min (by C-G formula based on Cr of 0.48). Liver Function Tests:  Recent Labs Lab 11/22/15 0500  AST 20  ALT 16  ALKPHOS 63  BILITOT 0.8  PROT 5.8*  ALBUMIN 3.2*   No results for input(s): LIPASE, AMYLASE in the last 168 hours. No results for input(s): AMMONIA in the last 168 hours. Coagulation Profile:  Recent Labs Lab 11/21/15 1958 11/22/15 0500 11/23/15 0426 11/24/15 0442  INR 2.35* 2.63* 2.14* 1.39   Cardiac Enzymes: No results for input(s): CKTOTAL, CKMB, CKMBINDEX, TROPONINI in the last 168 hours. BNP (last 3 results) No results for input(s): PROBNP in the last 8760 hours. HbA1C: No results for input(s): HGBA1C in the last 72 hours. CBG: No results for input(s): GLUCAP in the last 168 hours. Lipid Profile: No results for input(s): CHOL, HDL, LDLCALC, TRIG, CHOLHDL, LDLDIRECT in the last 72 hours. Thyroid Function Tests: No results for input(s): TSH, T4TOTAL, FREET4, T3FREE, THYROIDAB in the last 72 hours. Anemia Panel: No results for input(s): VITAMINB12, FOLATE, FERRITIN, TIBC, IRON, RETICCTPCT in the last 72 hours.  Sepsis Labs: No results for input(s): PROCALCITON, LATICACIDVEN in the last 168 hours.  Recent Results (from the past 240 hour(s))  Surgical PCR screen     Status: Abnormal   Collection Time: 11/23/15  4:35 AM  Result Value Ref Range Status   MRSA, PCR POSITIVE (A) NEGATIVE Final    Comment: RESULT CALLED TO, READ BACK BY AND VERIFIED WITH: K. SILK RN AT 0945 ON 07.21.17 BY SHUEA    Staphylococcus aureus POSITIVE (A) NEGATIVE Final    Comment:        The Xpert SA Assay (FDA approved for NASAL specimens in patients over 29 years of age), is one component of a comprehensive surveillance program.  Test performance has been validated by [facility] for patients greater than or equal to 84 year old. It is not intended to diagnose infection nor to guide or monitor treatment.           Radiology Studies: Pelvis Portable  11/23/2015  CLINICAL DATA:  Hip fracture.  Initial encounter. EXAM: PORTABLE PELVIS 1-2 VIEWS COMPARISON:  11/21/2015 FINDINGS: There has been interval ORIF of the previously described intertrochanteric fracture of the left femur. An antegrade intramedullary nail is partially visualized with 2 proximal screws which terminate in the femoral head. Fracture alignment appears near anatomic on this single AP image. The femoral heads are approximated with the acetabula on this single projection. An old right inferior pubic ramus fracture and prior internal fixation of the right femur are again noted. IMPRESSION: Interval internal fixation of intertrochanteric left femur fracture as above. Electronically Signed   By: Logan Bores M.D.   On: 11/23/2015 21:46   Dg Chest Port 1 View  11/24/2015  CLINICAL DATA:  Fluid overload, left breast cancer. EXAM: PORTABLE CHEST 1 VIEW COMPARISON:  11/21/2015. FINDINGS: Patient is slightly rotated. Trachea is midline. Heart size stable. Thoracic aorta is calcified. Lungs are clear. No pleural fluid. IMPRESSION:  1. No acute findings. 2. Aortic atherosclerosis. Electronically Signed   By: Lorin Picket M.D.   On: 11/24/2015 07:35   Dg C-arm 1-60 Min-no Report  11/23/2015  CLINICAL DATA: surgery C-ARM 1-60 MINUTES Fluoroscopy was utilized by the requesting physician.  No radiographic interpretation.   Dg Femur Min 2 Views Left  11/23/2015  CLINICAL DATA:  Patient status post ORIF left femur fracture. EXAM: LEFT FEMUR 2 VIEWS; DG C-ARM 1-60 MIN-NO REPORT COMPARISON:  Left hip radiograph 11/21/2015. FINDINGS: 4 intraoperative fluoroscopic images were submitted. Patient status post intra medullary rod and screw fixation of comminuted intertrochanteric left hip fracture. Improved anatomic alignment. Hardware appears intact. IMPRESSION: Patient status post ORIF left femur fracture with intra medullary rod and screw fixation.  Electronically Signed   By: Lovey Newcomer M.D.   On: 11/23/2015 20:01        Scheduled Meds: . sodium chloride   Intravenous Once  . atorvastatin  10 mg Oral QHS  . cefTRIAXone (ROCEPHIN)  IV  1 g Intravenous Q24H  . Chlorhexidine Gluconate Cloth  6 each Topical Q0600  . diltiazem  180 mg Oral Daily  . docusate sodium  100 mg Oral BID  . enoxaparin (LOVENOX) injection  30 mg Subcutaneous Q24H  . feeding supplement (ENSURE ENLIVE)  237 mL Oral BID BM  . levothyroxine  112 mcg Oral QAC breakfast  . mirabegron ER  50 mg Oral Daily  . multivitamin with minerals  1 tablet Oral Daily  . mupirocin ointment  1 application Nasal BID  . ondansetron  4 mg Intravenous Once  . pantoprazole  40 mg Oral Daily  . polyethylene glycol  17 g Oral Daily  . propafenone  150 mg Oral TID  . sertraline  25 mg Oral Daily  . vancomycin  1,000 mg Intravenous Once   Continuous Infusions: . sodium chloride Stopped (11/23/15 1419)  . 0.9 % NaCl with KCl 20 mEq / L 10 mL/hr at 11/24/15 0400     LOS: 3 days    Time spent: 40 minutes.    Bonnell Public, MD [facility] Pager [phone].  If 7PM-7AM, please contact night-coverage [email or web address] Password [facility] 11/24/2015, 10:11 AM

## 2015-11-24 NOTE — Progress Notes (Signed)
OT Cancellation Note  Patient Details Name: RABIAH SUMMERVILLE MRN: [id number] DOB: [birth year]   Cancelled Treatment:    Reason Eval/Treat Not Completed: OT screened, no needs identified, will sign off. Note pt is a long term resident of SNF. Will defer OT eval to SNF. Signing off for OT.  Pauline Aus Paris 11/24/2015, 3:58 PM

## 2015-11-24 NOTE — Evaluation (Signed)
Clinical/Bedside Swallow Evaluation Patient Details  Name: Shannon [name] MRN: [id number] Date of Birth: 1924-06-08  Today's Date: 11/24/2015 Time: SLP Start Time (ACUTE ONLY): 1602 SLP Stop Time (ACUTE ONLY): 1624 SLP Time Calculation (min) (ACUTE ONLY): 22 min  Past Medical History:  Past Medical History  Diagnosis Date  . GERD (gastroesophageal reflux disease)   . Hypertension   . Hypothyroidism   . Cancer [facility])     left breast  . Hx of radiation therapy 2001    left breast  . A-fib ([place])   . Constipation   . HLD (hyperlipidemia)   . GERD (gastroesophageal reflux disease)    Past Surgical History:  Past Surgical History  Procedure Laterality Date  . Breast surgery    . Intramedullary (im) nail intertrochanteric Right 09/24/2014    Procedure: INTRAMEDULLARY (IM) NAIL INTERTROCHANTRIC;  Surgeon: Meredith Pel, MD;  Location: [facility]S;  Service: Orthopedics;  Laterality: Right;   HPI:  82 year old female, PMH of dementia, paroxysmal A. fib on chronic Coumadin anticoagulation, GERD, HTN, hypothyroid, breast cancer status post XRT, HLD, presented to [facility] ED on 7/20 following accidental fall at home/? ALF and noted to have comminuted angulated intertrochanteric fracture of the left femur. Underwent surgical repair 7/22.  S/p surgery pt noted to be coughing with fluids, requiring oral suctioning, which removed fluids, per PT and RN. CXR clear.  Lungs with course crackles and febrile to 100 degrees.     Assessment / Plan / Recommendation Clinical Impression  Pt presents with acute changes in swallow with current admission.  Demonstrates coughing, throat-clearing with consumption of thin and nectar-thick liquids; multiple sub-swallows necessary with all consistencies; mild, persistent throat-clearing accompanies all swallows.  Pt is pleasant, but confused.  Denies prior swallowing deficits.  Question chronic issues (e.g, cervical osteophytes or esophageal narrowing) for  which pt usually compensates.  Given staff observations today (overt struggling to swallow liquids) and clinical signs present during evaluation, recommend NPO except ice chips and meds crushed in puree. SLP will f/u next dates for improvements or necessity of instrumental swallow study.  D/W RN.      Aspiration Risk  Moderate aspiration risk    Diet Recommendation     Medication Administration: Crushed with puree    Other  Recommendations Oral Care Recommendations: Oral care QID   Follow up Recommendations   (tba)    Frequency and Duration min 3x week  2 weeks       Prognosis Prognosis for Safe Diet Advancement: Good      Swallow Study   General Date of Onset: 11/22/15 HPI: 82 year old female, PMH of dementia, paroxysmal A. fib on chronic Coumadin anticoagulation, GERD, HTN, hypothyroid, breast cancer status post XRT, HLD, presented to [facility] ED on 7/20 following accidental fall at home/? ALF and noted to have comminuted angulated intertrochanteric fracture of the left femur. Underwent surgical repair 7/22.  S/p surgery pt noted to be coughing with fluids; CXR clear.  Lungs with course crackles.  Type of Study: Bedside Swallow Evaluation Previous Swallow Assessment: no Diet Prior to this Study: Regular;Thin liquids Temperature Spikes Noted: Yes Respiratory Status: Nasal cannula History of Recent Intubation:  (for surgical procedure) Behavior/Cognition: Alert;Cooperative;Confused Oral Cavity Assessment: Within Functional Limits (abrasion on lip) Oral Care Completed by SLP: Recent completion by staff Oral Cavity - Dentition: Adequate natural dentition Vision: Functional for self-feeding Self-Feeding Abilities: Able to feed self Patient Positioning: Upright in bed Baseline Vocal Quality: Normal Volitional Cough: Strong Volitional Swallow:  Able to elicit    Oral/Motor/Sensory Function Overall Oral Motor/Sensory Function: Within functional limits   Ice Chips Ice  chips: Impaired Presentation: Spoon Pharyngeal Phase Impairments: Multiple swallows   Thin Liquid Thin Liquid: Impaired Presentation: Cup Pharyngeal  Phase Impairments: Multiple swallows;Wet Vocal Quality;Cough - Delayed    Nectar Thick Nectar Thick Liquid: Impaired Presentation: Cup;Spoon Pharyngeal Phase Impairments: Multiple swallows;Wet Vocal Quality;Throat Clearing - Delayed   Honey Thick Honey Thick Liquid: Not tested   Puree Puree: Impaired Presentation: Spoon Pharyngeal Phase Impairments: Multiple swallows;Throat Clearing - Delayed   Solid   GO   Solid: Not tested       Fredia Chittenden L. Tivis Ringer, [place] CCC/SLP Pager [phone]  Juan Quam Laurice 11/24/2015,4:34 PM

## 2015-11-25 DIAGNOSIS — Z9189 Other specified personal risk factors, not elsewhere classified: Secondary | ICD-10-CM

## 2015-11-25 LAB — TYPE AND SCREEN
ABO/RH(D): A POS
Antibody Screen: NEGATIVE
UNIT DIVISION: 0
Unit division: 0

## 2015-11-25 LAB — PROTIME-INR
INR: 1.34 (ref 0.00–1.49)
PROTHROMBIN TIME: 16.7 s — AB (ref 11.6–15.2)

## 2015-11-25 LAB — BASIC METABOLIC PANEL
Anion gap: 4 — ABNORMAL LOW (ref 5–15)
BUN: 15 mg/dL (ref 6–20)
CALCIUM: 8.4 mg/dL — AB (ref 8.9–10.3)
CO2: 29 mmol/L (ref 22–32)
Chloride: 106 mmol/L (ref 101–111)
Creatinine, Ser: 0.58 mg/dL (ref 0.44–1.00)
GFR calc Af Amer: >60 mL/min (ref >60)
GLUCOSE: 101 mg/dL — AB (ref 65–99)
POTASSIUM: 3.3 mmol/L — AB (ref 3.5–5.1)
SODIUM: 139 mmol/L (ref 135–145)

## 2015-11-25 LAB — CBC
HCT: 23 % — ABNORMAL LOW (ref 36.0–46.0)
Hemoglobin: 7.5 g/dL — ABNORMAL LOW (ref 12.0–15.0)
MCH: 30.6 pg (ref 26.0–34.0)
MCHC: 32.6 g/dL (ref 30.0–36.0)
MCV: 93.9 fL (ref 78.0–100.0)
PLATELETS: 142 10*3/uL — AB (ref 150–400)
RBC: 2.45 MIL/uL — AB (ref 3.87–5.11)
RDW: 15.9 % — AB (ref 11.5–15.5)
WBC: 8.3 10*3/uL (ref 4.0–10.5)

## 2015-11-25 LAB — PREPARE FRESH FROZEN PLASMA
UNIT DIVISION: 0
Unit division: 0

## 2015-11-25 LAB — PREPARE RBC (CROSSMATCH)

## 2015-11-25 MED ORDER — ENOXAPARIN SODIUM 30 MG/0.3ML ~~LOC~~ SOLN: 30.0000 mg | SUBCUTANEOUS | 0 refills | Status: DC

## 2015-11-25 MED ORDER — SODIUM CHLORIDE 0.9 % IV SOLN: Freq: Once | INTRAVENOUS | Status: DC

## 2015-11-25 MED ORDER — HYDROCODONE-ACETAMINOPHEN 5-325 MG PO TABS: 1.0000 | ORAL_TABLET | Freq: Four times a day (QID) | ORAL | 0 refills | Status: DC | PRN

## 2015-11-25 MED ORDER — WARFARIN SODIUM 5 MG PO TABS
5.0000 mg | ORAL_TABLET | Freq: Once | ORAL | Status: AC
  Administered 2015-11-25: 5 mg via ORAL
  Filled 2015-11-25: qty 1

## 2015-11-25 MED ORDER — SODIUM CHLORIDE 0.9 % IV SOLN
INTRAVENOUS | Status: DC
  Administered 2015-11-26 (×2): via INTRAVENOUS

## 2015-11-25 MED ORDER — WARFARIN - PHARMACIST DOSING INPATIENT: Freq: Every day | Status: DC

## 2015-11-25 NOTE — Progress Notes (Signed)
PROGRESS NOTE  Shannon [name]  [id number] DOB: [birth year]  DOA: 11/21/2015 PCP: [name], MD   Brief Narrative:  82 year old female, PMH of dementia, paroxysmal A. fib on chronic Coumadin anticoagulation, GERD, HTN, hypothyroid, breast cancer status post XRT, HLD, presented to [facility] ED on 7/20 following accidental fall at home/? ALF and noted to have comminuted angulated intertrochanteric fracture of the left femur. Orthopedics was consulted and planned surgical fixation on 7/21 pending improvement in INR.   Assessment & Plan:   Principal Problem:   Hip fracture ([place]) Active Problems:   A-fib (HCC)   Warfarin-induced coagulopathy (HCC)   Dementia   Left hip fracture - S/P surgery. Ortho is directing care. On S/C lovenox.  Paroxysmal atrial fibrillation - Warfarin re-started. - Currently in sinus rhythm. Continue propafenone on and diltiazem. Coumadin held and INR being reversed for surgery. Repeat her dose of vitamin K 5 mg 1. - EKG 7/20: Normal sinus rhythm, LAD, LAFB, nonspecific T-wave abnormalities and no acute changes. QTC 459 ms - LVEF 09/20/15: 60-65 percent. - CHADS-VASC score of 4  ASPIRATION RISK - Repeat swallowing evaluation. IVF Normal Saline 50cc/hr (Monitor respiratory and cardiac status closely). AM labs.  Hypothyroid - Continue Synthroid  Hyperlipidemia - Continue statin  GERD -Continue PPI   Dementia - Mental status possibly at baseline. No agitation.  Anemia - Follow CBCs.   DVT prophylaxis:  SCD's Code Status: Full  Family Communication: None at bedside. Discussed with patient.  Disposition Plan: To be determined. Likely to SNF when medically stable.   Consultants:   Orthopedics   Procedures:   None  Antimicrobials:   None    Subjective: Left hip pain on movement. Patient states that she lives at home, ambulates without support and even climbs a flight of stairs. Denies any chest pain, dyspnea or  palpitations, dizziness or lightheadedness. No syncopal episode reported. Denies MI, CAD or CHF.  Objective:  Vitals:   11/25/15 0533 11/25/15 1335 11/25/15 1618 11/25/15 1655  BP: (!) 108/49 (!) 117/56 (!) 115/54 (!) 112/54  Pulse: 72 85 83 82  Resp: 18 18 20 20   Temp: 98.5 F (36.9 C) 98.4 F (36.9 C) 99.6 F (37.6 C) 99.4 F (37.4 C)  TempSrc: Oral Oral Oral Oral  SpO2: 100% 97% 98% 99%  Weight:      Height:        Intake/Output Summary (Last 24 hours) at 11/25/15 1737 Last data filed at 11/25/15 1049  Gross per 24 hour  Intake           830.08 ml  Output              975 ml  Net          -144.92 ml   Filed Weights   11/21/15 2235  Weight: 70.3 kg (155 lb)    Examination:  General exam: Pleasant elderly female lying comfortably supine in bed.  Respiratory system: Clear to auscultation. Respiratory effort normal. Cardiovascular system: S1 & S2 heard, RRR. No JVD, murmurs, rubs, gallops or clicks. No pedal edema. Gastrointestinal system: Abdomen is nondistended, soft and nontender. No organomegaly or masses felt. Normal bowel sounds heard. Central nervous system: Alert and oriented 2. No focal neurological deficits. Extremities: Symmetric 5 x 5 power In all limbs except left lower extremity with assessment limited by pain. Left lower extremity externally rotated  Skin: No rashes, lesions or ulcers Psychiatry: Judgement and insight appear normal. Mood & affect appropriate.  Data Reviewed: I have personally reviewed following labs and imaging studies  CBC:  Recent Labs Lab 11/21/15 1958 11/22/15 0500 11/23/15 0426 11/24/15 0442 11/25/15 0431  WBC 13.0* 9.0 9.4 10.5 8.3  NEUTROABS 10.9*  --   --   --   --   HGB 12.3 10.2* 7.8* 9.8* 7.5*  HCT 37.3 31.0* 23.9* 29.2* 23.0*  MCV 93.3 94.2 95.2 91.0 93.9  PLT 227 190 142* 136* [id number]*   Basic Metabolic Panel:  Recent Labs Lab 11/21/15 1958 11/22/15 0500 11/24/15 0442 11/25/15 0431  NA 139 138 138 139    K 3.8 4.2 3.6 3.3*  CL 107 107 105 106  CO2 26 26 29 29   GLUCOSE 141* 154* 122* 101*  BUN 20 16 10 15   CREATININE 0.77 0.62 0.48 0.58  CALCIUM 9.8 9.4 8.7* 8.4*   GFR: Estimated Creatinine Clearance: 46.9 mL/min (by C-G formula based on SCr of 0.8 mg/dL). Liver Function Tests:  Recent Labs Lab 11/22/15 0500  AST 20  ALT 16  ALKPHOS 63  BILITOT 0.8  PROT 5.8*  ALBUMIN 3.2*   No results for input(s): LIPASE, AMYLASE in the last 168 hours. No results for input(s): AMMONIA in the last 168 hours. Coagulation Profile:  Recent Labs Lab 11/21/15 1958 11/22/15 0500 11/23/15 0426 11/24/15 0442 11/25/15 0431  INR 2.35* 2.63* 2.14* 1.39 1.34   Cardiac Enzymes: No results for input(s): CKTOTAL, CKMB, CKMBINDEX, TROPONINI in the last 168 hours. BNP (last 3 results) No results for input(s): PROBNP in the last 8760 hours. HbA1C: No results for input(s): HGBA1C in the last 72 hours. CBG: No results for input(s): GLUCAP in the last 168 hours. Lipid Profile: No results for input(s): CHOL, HDL, LDLCALC, TRIG, CHOLHDL, LDLDIRECT in the last 72 hours. Thyroid Function Tests: No results for input(s): TSH, T4TOTAL, FREET4, T3FREE, THYROIDAB in the last 72 hours. Anemia Panel: No results for input(s): VITAMINB12, FOLATE, FERRITIN, TIBC, IRON, RETICCTPCT in the last 72 hours.  Sepsis Labs: No results for input(s): PROCALCITON, LATICACIDVEN in the last 168 hours.  Recent Results (from the past 240 hour(s))  Surgical PCR screen     Status: Abnormal   Collection Time: 11/23/15  4:35 AM  Result Value Ref Range Status   MRSA, PCR POSITIVE (A) NEGATIVE Final    Comment: RESULT CALLED TO, READ BACK BY AND VERIFIED WITH: K. SILK RN AT 0945 ON 07.21.17 BY SHUEA    Staphylococcus aureus POSITIVE (A) NEGATIVE Final    Comment:        The Xpert SA Assay (FDA approved for NASAL specimens in patients over 31 years of age), is one component of a comprehensive surveillance program.  Test  performance has been validated by [facility] for patients greater than or equal to 43 year old. It is not intended to diagnose infection nor to guide or monitor treatment.   Culture, Urine     Status: None (Preliminary result)   Collection Time: 11/24/15 10:53 AM  Result Value Ref Range Status   Specimen Description URINE, CATHETERIZED  Final   Special Requests NONE  Final   Culture   Final    CULTURE REINCUBATED FOR BETTER GROWTH Performed at [facility]    Report Status PENDING  Incomplete         Radiology Studies: Dg Chest 1 View  Result Date: 11/24/2015 CLINICAL DATA:  Shortness of breath, ORIF yesterday. EXAM: CHEST 1 VIEW COMPARISON:  11/24/2015. FINDINGS: Patient is rotated. Heart size is stable. Thoracic  aorta is calcified. Lungs are clear. No pleural fluid. IMPRESSION: 1. No acute findings. 2. Aortic atherosclerosis. Electronically Signed   By: Lorin Picket M.D.   On: 11/24/2015 13:38   Pelvis Portable  Result Date: 11/23/2015 CLINICAL DATA:  Hip fracture.  Initial encounter. EXAM: PORTABLE PELVIS 1-2 VIEWS COMPARISON:  11/21/2015 FINDINGS: There has been interval ORIF of the previously described intertrochanteric fracture of the left femur. An antegrade intramedullary nail is partially visualized with 2 proximal screws which terminate in the femoral head. Fracture alignment appears near anatomic on this single AP image. The femoral heads are approximated with the acetabula on this single projection. An old right inferior pubic ramus fracture and prior internal fixation of the right femur are again noted. IMPRESSION: Interval internal fixation of intertrochanteric left femur fracture as above. Electronically Signed   By: Logan Bores M.D.   On: 11/23/2015 21:46   Dg Chest Port 1 View  Result Date: 11/24/2015 CLINICAL DATA:  Fluid overload, left breast cancer. EXAM: PORTABLE CHEST 1 VIEW COMPARISON:  11/21/2015. FINDINGS: Patient is slightly rotated. Trachea is  midline. Heart size stable. Thoracic aorta is calcified. Lungs are clear. No pleural fluid. IMPRESSION: 1. No acute findings. 2. Aortic atherosclerosis. Electronically Signed   By: Lorin Picket M.D.   On: 11/24/2015 07:35   Dg C-arm 1-60 Min-no Report  Result Date: 11/23/2015 CLINICAL DATA: surgery C-ARM 1-60 MINUTES Fluoroscopy was utilized by the requesting physician.  No radiographic interpretation.   Dg Femur Min 2 Views Left  Result Date: 11/23/2015 CLINICAL DATA:  Patient status post ORIF left femur fracture. EXAM: LEFT FEMUR 2 VIEWS; DG C-ARM 1-60 MIN-NO REPORT COMPARISON:  Left hip radiograph 11/21/2015. FINDINGS: 4 intraoperative fluoroscopic images were submitted. Patient status post intra medullary rod and screw fixation of comminuted intertrochanteric left hip fracture. Improved anatomic alignment. Hardware appears intact. IMPRESSION: Patient status post ORIF left femur fracture with intra medullary rod and screw fixation. Electronically Signed   By: Lovey Newcomer M.D.   On: 11/23/2015 20:01        Scheduled Meds: . sodium chloride   Intravenous Once  . sodium chloride   Intravenous Once  . atorvastatin  10 mg Oral QHS  . cefTRIAXone (ROCEPHIN)  IV  1 g Intravenous Q24H  . Chlorhexidine Gluconate Cloth  6 each Topical Q0600  . diltiazem  180 mg Oral Daily  . docusate sodium  100 mg Oral BID  . enoxaparin (LOVENOX) injection  30 mg Subcutaneous Q24H  . feeding supplement (ENSURE ENLIVE)  237 mL Oral BID BM  . levothyroxine  112 mcg Oral QAC breakfast  . mirabegron ER  50 mg Oral Daily  . multivitamin with minerals  1 tablet Oral Daily  . mupirocin ointment  1 application Nasal BID  . pantoprazole  40 mg Oral Daily  . polyethylene glycol  17 g Oral Daily  . propafenone  150 mg Oral TID  . sertraline  25 mg Oral Daily  . Warfarin - Pharmacist Dosing Inpatient   Does not apply q1800   Continuous Infusions: . sodium chloride       LOS: 4 days    Time spent: 40  minutes.    Bonnell Public, MD [facility] Pager [phone].  If 7PM-7AM, please contact night-coverage [email or web address] Password [facility] 11/25/2015, 5:37 PM

## 2015-11-25 NOTE — Progress Notes (Addendum)
ANTICOAGULATION CONSULT NOTE - Initial Consult  Pharmacy Consult for warfarin Indication: atrial fibrillation  Allergies  Allergen Reactions  . Demerol Nausea And Vomiting  . Keflet [Cephalexin] Nausea And Vomiting  . Sulfa Antibiotics Nausea And Vomiting  . Tequin Nausea Only  . Penicillins Rash    Patient Measurements: Height: 5\' 5"  (165.1 cm) Weight: 155 lb (70.3 kg) IBW/kg (Calculated) : 57  Vital Signs: Temp: 98.5 F (36.9 C) (07/23 0533) Temp Source: Oral (07/23 0533) BP: 108/49 (07/23 0533) Pulse Rate: 72 (07/23 0533)  Labs:  Recent Labs  11/23/15 0426 11/24/15 0442 11/25/15 0431  HGB 7.8* 9.8* 7.5*  HCT 23.9* 29.2* 23.0*  PLT 142* 136* 142*  LABPROT 23.8* 17.1* 16.7*  INR 2.14* 1.39 1.34  CREATININE  --  0.48 0.58    Estimated Creatinine Clearance: 46.9 mL/min (by C-G formula based on SCr of 0.8 mg/dL).   Medical History: Past Medical History:  Diagnosis Date  . A-fib ([place])   . Cancer ([place])    left breast  . Constipation   . GERD (gastroesophageal reflux disease)   . GERD (gastroesophageal reflux disease)   . HLD (hyperlipidemia)   . Hx of radiation therapy 2001   left breast  . Hypertension   . Hypothyroidism     Medications:  Prescriptions Prior to Admission  Medication Sig Dispense Refill Last Dose  . atorvastatin (LIPITOR) 10 MG tablet Take 10 mg by mouth at bedtime.    11/20/2015 at Unknown time  . Cholecalciferol (VITAMIN D) 2000 UNITS tablet Take 2,000 Units by mouth daily.   11/21/2015 at Unknown time  . CVS STOOL SOFTENER 100 MG capsule Take 1 capsule by mouth 2 (two) times daily as needed for moderate constipation.   11 unknown  . diltiazem (CARDIZEM CD) 180 MG 24 hr capsule Take 180 mg by mouth daily.    11/21/2015 at Unknown time  . levothyroxine (SYNTHROID, LEVOTHROID) 112 MCG tablet Take 112 mcg by mouth daily before breakfast.   11/21/2015 at Unknown time  . MYRBETRIQ 50 MG TB24 Take 50 mg by mouth daily.    11/21/2015 at Unknown  time  . pantoprazole (PROTONIX) 40 MG tablet Take 40 mg by mouth daily.   11/21/2015 at Unknown time  . polyethylene glycol (MIRALAX / GLYCOLAX) packet Take 17 g by mouth daily. 14 each 0 11/20/2015 at Unknown time  . propafenone (RYTHMOL) 150 MG tablet Take 150 mg by mouth 3 (three) times daily.   11/21/2015 at Unknown time  . sertraline (ZOLOFT) 25 MG tablet Take 25 mg by mouth daily.    11/21/2015 at Unknown time  . UNABLE TO FIND Take 60 mLs by mouth 3 (three) times daily. Med Name:MedPass 2.0   11/21/2015 at Unknown time  . warfarin (COUMADIN) 2.5 MG tablet Take 2.5 mg by mouth 4 (four) times a week. Tues, Thursday, Sat, and Sun   11/20/2015 at Unknown time  . warfarin (COUMADIN) 5 MG tablet Take 5 mg by mouth as directed. Take 1 tablet(5 mg) on Mon, Wed, Fri   11/21/2015 at 1700    Assessment: 82yo F on warfarin for Afib., admitted with L hip fracture. Warfarin held for surgery and received Vit K 5mg  PO x 1 on 7/19 PM and 7/20. Pharmacy is asked to resume warfarin dosing. Also planning discharge to SNF today.  Today, 11/25/15:  INR 1.34  On Lovenox 30mg  q24h (conservative dose presumably for advanced age and post-op) until INR therapeutic.  H/H trended down from yesterday. No  bleeding reported/documented. Planning to transfuse 1 units PRBCs.  Meal intake is poor.  Drug-interactions: Propafenone can increase response to warfarin but she has been on it with warfarin at home.   Goal of Therapy:  INR 2-3 Monitor platelets by anticoagulation protocol: Yes   Plan:   Give warfarin 5mg  PO x 1 this am.  Daily INR.  If discharged today, recommend resuming home regimen and checking INR daily at the SNF with instructions to stop Lovenox once the INR is 2 or greater.   Romeo Rabon, PharmD, pager [phone]. 11/25/2015,9:29 AM.

## 2015-11-25 NOTE — Care Management (Signed)
CM attempted to discuss needs with patient at the bedside. She was asleep. Venita Sheffield RN CCM

## 2015-11-25 NOTE — Progress Notes (Signed)
Speech Language Pathology Treatment: Dysphagia  Patient Details Name: Shannon [name] MRN: [id number] DOB: [birth year] Today's Date: 11/25/2015 Time: [id number] SLP Time Calculation (min) (ACUTE ONLY): 15 min  Assessment / Plan / Recommendation Clinical Impression  Pt upright in bed upon SLP arrival.  Denies h/o dysphagia except for occasional difficulties with pills.  SLP noted pt also with left facial asymmetry= ? If baseline.    Voice is clear and strong.  However upon po intake, pt continues to present with multiple swallows concerning for possible pharyngo-cervical esophageal dysphagia.  Also ? Esophageal component given pt h/o XRT for breast cancer.  Pt with productive cough  - observed only post=intake during our slp session.  Mild dyspnea after just a few boluses observed.  Poor awareness to these issues noted causing SLP to suspect some low grade chronic deficits.    Fortunately cough is strong and productive, however pt will benefit from [facility] in am to definitively determine if pt is appropriate for po intake.    Recommend npo except ice chips, medicine with applesauce only as tolerated.  Informed pt to plan of MBS in am.     HPI HPI: 82 year old female, PMH of dementia, paroxysmal A. fib on chronic Coumadin anticoagulation, GERD, HTN, hypothyroid, breast cancer status post XRT, HLD, presented to [facility] ED on 7/20 following accidental fall at home/? ALF and noted to have comminuted angulated intertrochanteric fracture of the left femur. Underwent surgical repair 7/22.  S/p surgery pt noted to be coughing with fluids; CXR clear.  Lungs with course crackles.       SLP Plan  MBS (next am)     Recommendations  Diet recommendations:  (ice chips) Medication Administration: Crushed with puree (small whole with puree) Supervision: Patient able to self feed Compensations: Multiple dry swallows after each bite/sip;Other (Comment) (cough and expectorate prn)             Oral  Care Recommendations: Oral care QID Follow up Recommendations:  (tba) Plan: MBS (next am)     [place], [facility] SLP [phone]

## 2015-11-25 NOTE — Progress Notes (Signed)
SLP received call from RN re:pt's swallowing ability.  Per RN, pt required suctioning over night and is mildly gurgly today - concerning for aspiration of secretions.  Pt also with weak cough per RN.  If pt demonstrates any s/s of aspiration with medications crushed with applesauce, would recommend stop- Pt may benefit from short term alternative means of nutrition for medications to allow time for continued recovery s/p surgery.  SLP to follow up.  Thanks.   Luanna Salk, [facility] SLP  [phone]

## 2015-11-25 NOTE — Progress Notes (Addendum)
Subjective: Patient stable resting pain controlled   Objective: Vital signs in last 24 hours: Temp:  [98.5 F (36.9 C)-100.8 F (38.2 C)] 98.5 F (36.9 C) (07/23 0533) Pulse Rate:  [72-103] 72 (07/23 0533) Resp:  [16-18] 18 (07/23 0533) BP: (108-124)/(49-80) 108/49 (07/23 0533) SpO2:  [92 %-100 %] 100 % (07/23 0533)  Intake/Output from previous day: 07/22 0701 - 07/23 0700 In: 950.1 [P.O.:120; I.V.:830.1] Out: 1075 [Urine:1075] Intake/Output this shift: No intake/output data recorded.  Exam:  No cellulitis present Compartment soft  Labs:  Recent Labs  11/23/15 0426 11/24/15 0442 11/25/15 0431  HGB 7.8* 9.8* 7.5*    Recent Labs  11/24/15 0442 11/25/15 0431  WBC 10.5 8.3  RBC 3.21* 2.45*  HCT 29.2* 23.0*  PLT 136* 142*    Recent Labs  11/24/15 0442 11/25/15 0431  NA 138 139  K 3.6 3.3*  CL 105 106  CO2 29 29  BUN 10 15  CREATININE 0.48 0.58  GLUCOSE 122* 101*  CALCIUM 8.7* 8.4*    Recent Labs  11/24/15 0442 11/25/15 0431  INR 1.39 1.34    Assessment/Plan: Patient stable and ready for discharge to skilled nursing -  Her hemoglobin did drop today transfuse 1 unit - I have placed the patient on Lovenox for 7 days at the time of discharge.  Her Coumadin will also be restarted.  The Lovenox can be a bridge until she is anticoagulated again [facility] SCOTT 11/25/2015, 8:35 AM

## 2015-11-26 ENCOUNTER — Encounter (HOSPITAL_COMMUNITY): Payer: Self-pay | Admitting: Orthopedic Surgery

## 2015-11-26 ENCOUNTER — Inpatient Hospital Stay (HOSPITAL_COMMUNITY): Payer: Medicare Other

## 2015-11-26 LAB — CBC WITH DIFFERENTIAL/PLATELET
Basophils Absolute: 0 10*3/uL (ref 0.0–0.1)
Basophils Relative: 0 %
Eosinophils Absolute: 0 10*3/uL (ref 0.0–0.7)
Eosinophils Relative: 1 %
HCT: 24.6 % — ABNORMAL LOW (ref 36.0–46.0)
Hemoglobin: 8.3 g/dL — ABNORMAL LOW (ref 12.0–15.0)
Lymphocytes Relative: 14 %
Lymphs Abs: 1.1 10*3/uL (ref 0.7–4.0)
MCH: 30.9 pg (ref 26.0–34.0)
MCHC: 33.7 g/dL (ref 30.0–36.0)
MCV: 91.4 fL (ref 78.0–100.0)
Monocytes Absolute: 1 10*3/uL (ref 0.1–1.0)
Monocytes Relative: 13 %
Neutro Abs: 5.7 10*3/uL (ref 1.7–7.7)
Neutrophils Relative %: 72 %
Platelets: 176 10*3/uL (ref 150–400)
RBC: 2.69 MIL/uL — ABNORMAL LOW (ref 3.87–5.11)
RDW: 16.2 % — ABNORMAL HIGH (ref 11.5–15.5)
WBC: 7.8 10*3/uL (ref 4.0–10.5)

## 2015-11-26 LAB — TYPE AND SCREEN
ABO/RH(D): A POS
ANTIBODY SCREEN: NEGATIVE
UNIT DIVISION: 0

## 2015-11-26 LAB — RENAL FUNCTION PANEL
Albumin: 2.5 g/dL — ABNORMAL LOW (ref 3.5–5.0)
Anion gap: 6 (ref 5–15)
BUN: 18 mg/dL (ref 6–20)
CO2: 26 mmol/L (ref 22–32)
Calcium: 8.9 mg/dL (ref 8.9–10.3)
Chloride: 109 mmol/L (ref 101–111)
Creatinine, Ser: 0.45 mg/dL (ref 0.44–1.00)
GFR calc Af Amer: >60 mL/min (ref >60)
GFR calc non Af Amer: >60 mL/min (ref >60)
Glucose, Bld: 88 mg/dL (ref 65–99)
Phosphorus: 2.2 mg/dL — ABNORMAL LOW (ref 2.5–4.6)
Potassium: 3.3 mmol/L — ABNORMAL LOW (ref 3.5–5.1)
Sodium: 141 mmol/L (ref 135–145)

## 2015-11-26 LAB — PROTIME-INR
INR: 1.6 — AB (ref 0.00–1.49)
PROTHROMBIN TIME: 19 s — AB (ref 11.6–15.2)

## 2015-11-26 MED ORDER — POTASSIUM CHLORIDE 10 MEQ/50ML IV SOLN: 10.0000 meq | INTRAVENOUS | Status: DC

## 2015-11-26 MED ORDER — POTASSIUM CHLORIDE 20 MEQ/15ML (10%) PO SOLN
40.0000 meq | Freq: Once | ORAL | Status: AC
  Administered 2015-11-26: 40 meq via ORAL
  Filled 2015-11-26: qty 30

## 2015-11-26 MED ORDER — WARFARIN SODIUM 5 MG PO TABS
5.0000 mg | ORAL_TABLET | Freq: Once | ORAL | Status: AC
  Administered 2015-11-26: 5 mg via ORAL
  Filled 2015-11-26: qty 1

## 2015-11-26 NOTE — Progress Notes (Signed)
PROGRESS NOTE  Shannon [name]  [id number] DOB: May 14, 1924  DOA: 11/21/2015 PCP: [name], MD   Brief Narrative:  82 year old female, PMH of dementia, paroxysmal A. fib on chronic Coumadin anticoagulation, GERD, HTN, hypothyroid, breast cancer status post XRT, HLD, presented to [facility] ED on 7/20 following accidental fall at home/? ALF and noted to have comminuted angulated intertrochanteric fracture of the left femur. Orthopedics was consulted and planned surgical fixation on 7/21 pending improvement in INR.   Assessment & Plan:   Principal Problem:   Hip fracture ([place]) Active Problems:   A-fib (HCC)   Warfarin-induced coagulopathy (HCC)   Dementia   At high risk for aspiration   Left hip fracture - S/P surgery. Ortho is directing care. On S/C lovenox.  Paroxysmal atrial fibrillation - Warfarin re-started. - Currently in sinus rhythm. Continue propafenone on and diltiazem. Coumadin held and INR being reversed for surgery. Repeat her dose of vitamin K 5 mg 1. - EKG 7/20: Normal sinus rhythm, LAD, LAFB, nonspecific T-wave abnormalities and no acute changes. QTC 459 ms - LVEF 09/20/15: 60-65 percent. - CHADS-VASC score of 4  ASPIRATION RISK - Repeat speech evaluation result is noted. Aspiration risk, but slowly improving. IVF Normal Saline 50cc/hr (Monitor respiratory and cardiac status closely).    Hypothyroid - Continue Synthroid  Hyperlipidemia - Continue statin  GERD -Continue PPI   Dementia - Mental status possibly at baseline. No agitation.  Anemia - Follow CBCs.   DVT prophylaxis:  SCD's Code Status: Full  Family Communication: None at bedside. Discussed with patient.  Disposition Plan: To be determined. Likely to SNF when medically stable.   Consultants:   Orthopedics   Procedures:   None  Antimicrobials:   None    Subjective: Left hip pain on movement. Patient states that she lives at home, ambulates without support and  even climbs a flight of stairs. Denies any chest pain, dyspnea or palpitations, dizziness or lightheadedness. No syncopal episode reported. Denies MI, CAD or CHF.  Objective:  Vitals:   11/25/15 1910 11/25/15 2130 11/26/15 0600 11/26/15 1441  BP: (!) 116/53 (!) 114/51 133/64 (!) 119/51  Pulse: 78 80 80 72  Resp: 18 16 16 18   Temp: 100 F (37.8 C) 99.9 F (37.7 C) 99.5 F (37.5 C) 98.7 F (37.1 C)  TempSrc: Oral Oral Oral Oral  SpO2: 97% 98% 97% 98%  Weight:      Height:        Intake/Output Summary (Last 24 hours) at 11/26/15 1908 Last data filed at 11/26/15 1700  Gross per 24 hour  Intake              720 ml  Output              800 ml  Net              -80 ml   Filed Weights   11/21/15 2235  Weight: 70.3 kg (155 lb)    Examination:  General exam: Pleasant elderly female lying comfortably supine in bed.  Respiratory system: Clear to auscultation. Respiratory effort normal. Cardiovascular system: S1 & S2 heard, RRR. No JVD, murmurs, rubs, gallops or clicks. No pedal edema. Gastrointestinal system: Abdomen is nondistended, soft and nontender. No organomegaly or masses felt. Normal bowel sounds heard. Central nervous system: Alert and oriented 2. No focal neurological deficits. Extremities: Symmetric 5 x 5 power In all limbs except left lower extremity with assessment limited by pain. Left lower extremity externally rotated  Skin: No rashes, lesions or ulcers Psychiatry: Judgement and insight appear normal. Mood & affect appropriate.     Data Reviewed: I have personally reviewed following labs and imaging studies  CBC:  Recent Labs Lab 11/21/15 1958 11/22/15 0500 11/23/15 0426 11/24/15 0442 11/25/15 0431 11/26/15 0410  WBC 13.0* 9.0 9.4 10.5 8.3 7.8  NEUTROABS 10.9*  --   --   --   --  5.7  HGB 12.3 10.2* 7.8* 9.8* 7.5* 8.3*  HCT 37.3 31.0* 23.9* 29.2* 23.0* 24.6*  MCV 93.3 94.2 95.2 91.0 93.9 91.4  PLT 227 190 142* 136* 142* 0000000   Basic Metabolic  Panel:  Recent Labs Lab 11/21/15 1958 11/22/15 0500 11/24/15 0442 11/25/15 0431 11/26/15 0410  NA 139 138 138 139 141  K 3.8 4.2 3.6 3.3* 3.3*  CL 107 107 105 106 109  CO2 26 26 29 29 26   GLUCOSE 141* 154* 122* 101* 88  BUN 20 16 10 15 18   CREATININE 0.77 0.62 0.48 0.58 0.45  CALCIUM 9.8 9.4 8.7* 8.4* 8.9  PHOS  --   --   --   --  2.2*   GFR: Estimated Creatinine Clearance: 46.9 mL/min (by C-G formula based on SCr of 0.8 mg/dL). Liver Function Tests:  Recent Labs Lab 11/22/15 0500 11/26/15 0410  AST 20  --   ALT 16  --   ALKPHOS 63  --   BILITOT 0.8  --   PROT 5.8*  --   ALBUMIN 3.2* 2.5*   No results for input(s): LIPASE, AMYLASE in the last 168 hours. No results for input(s): AMMONIA in the last 168 hours. Coagulation Profile:  Recent Labs Lab 11/22/15 0500 11/23/15 0426 11/24/15 0442 11/25/15 0431 11/26/15 0410  INR 2.63* 2.14* 1.39 1.34 1.60*   Cardiac Enzymes: No results for input(s): CKTOTAL, CKMB, CKMBINDEX, TROPONINI in the last 168 hours. BNP (last 3 results) No results for input(s): PROBNP in the last 8760 hours. HbA1C: No results for input(s): HGBA1C in the last 72 hours. CBG: No results for input(s): GLUCAP in the last 168 hours. Lipid Profile: No results for input(s): CHOL, HDL, LDLCALC, TRIG, CHOLHDL, LDLDIRECT in the last 72 hours. Thyroid Function Tests: No results for input(s): TSH, T4TOTAL, FREET4, T3FREE, THYROIDAB in the last 72 hours. Anemia Panel: No results for input(s): VITAMINB12, FOLATE, FERRITIN, TIBC, IRON, RETICCTPCT in the last 72 hours.  Sepsis Labs: No results for input(s): PROCALCITON, LATICACIDVEN in the last 168 hours.  Recent Results (from the past 240 hour(s))  Surgical PCR screen     Status: Abnormal   Collection Time: 11/23/15  4:35 AM  Result Value Ref Range Status   MRSA, PCR POSITIVE (A) NEGATIVE Final    Comment: RESULT CALLED TO, READ BACK BY AND VERIFIED WITH: K. SILK RN AT 0945 ON 07.21.17 BY SHUEA     Staphylococcus aureus POSITIVE (A) NEGATIVE Final    Comment:        The Xpert SA Assay (FDA approved for NASAL specimens in patients over 14 years of age), is one component of a comprehensive surveillance program.  Test performance has been validated by [facility] for patients greater than or equal to 58 year old. It is not intended to diagnose infection nor to guide or monitor treatment.   Culture, Urine     Status: None (Preliminary result)   Collection Time: 11/24/15 10:53 AM  Result Value Ref Range Status   Specimen Description URINE, CATHETERIZED  Final   Special Requests NONE  Final   Culture   Final    CULTURE REINCUBATED FOR BETTER GROWTH Performed at [facility]    Report Status PENDING  Incomplete         Radiology Studies: Dg Swallowing Func-speech Pathology  Result Date: 11/26/2015 Objective Swallowing Evaluation: Type of Study: MBS-Modified Barium Swallow Study Patient Details Name: Shannon [name] MRN: [id number] Date of Birth: Jan 27, 1925 Today's Date: 11/26/2015 Time: SLP Start Time (ACUTE ONLY): 0815-SLP Stop Time (ACUTE ONLY): 0850 SLP Time Calculation (min) (ACUTE ONLY): 35 min Past Medical History: Past Medical History: Diagnosis Date . A-fib ([place])  . Cancer ([place])   left breast . Constipation  . GERD (gastroesophageal reflux disease)  . GERD (gastroesophageal reflux disease)  . HLD (hyperlipidemia)  . Hx of radiation therapy 2001  left breast . Hypertension  . Hypothyroidism  Past Surgical History: Past Surgical History: Procedure Laterality Date . BREAST SURGERY   . INTRAMEDULLARY (IM) NAIL INTERTROCHANTERIC Right 09/24/2014  Procedure: INTRAMEDULLARY (IM) NAIL INTERTROCHANTRIC;  Surgeon: Meredith Pel, MD;  Location: [facility] ORS;  Service: Orthopedics;  Laterality: Right; HPI: 82 year old female, PMH of dementia, paroxysmal A. fib on chronic Coumadin anticoagulation, GERD, HTN, hypothyroid, breast cancer status post XRT, HLD, presented to [facility] ED on 7/20 following accidental fall at home/? ALF and noted to have comminuted angulated intertrochanteric fracture of the left femur. Underwent surgical repair 7/22.  S/p surgery pt noted to be coughing with fluids; CXR clear.  Lungs with course crackles.  Subjective: pt awake in chair Assessment / Plan / Recommendation CHL IP CLINICAL IMPRESSIONS 11/26/2015 Therapy Diagnosis Severe cervical esophageal phase dysphagia;Moderate cervical esophageal phase dysphagia Clinical Impression Pt presents with moderately severe cervical esophageal dysphagia with suspected Zenker's diverticulum (please see images). Radiologist not present to confirm.   Pt with poor clearance of barium through cervical esophagus due stasis from diverticulum with resultant backflow into pharynx/pyriform sinus WITHOUT pt sensation.  Cued dry swallows faciliate clearance but are marginally difficult for pt to perform.   Pt likely with clear liquids better although aspiration risk will be chronic.  Of note, pt did NOT cough or aspirate during MBS but precautions were taken to facilltate clearance.  Oropharyngeal swallow was largely intact.  This deficit is likely not new but pt largely denies dysphagia symptoms indicating poor sensation.  Using live video and teach back, educated pt to findings and precautions.   Impact on safety and function -moderately severe risk   CHL IP TREATMENT RECOMMENDATION 11/24/2015 Treatment Recommendations Therapy as outlined in treatment plan below   Prognosis 11/26/2015 Prognosis for Safe Diet Advancement Guarded Barriers to Reach Goals Time post onset Barriers/Prognosis Comment -- CHL IP DIET RECOMMENDATION 11/26/2015 SLP Diet Recommendations Thin liquid Liquid Administration via Cup;Straw Medication Administration Crushed with puree Compensations Slow rate;Small sips/bites;Multiple dry swallows after each bite/sip Postural Changes Seated upright at 90 degrees;Remain semi-upright after after feeds/meals  (Comment)   CHL IP OTHER RECOMMENDATIONS 11/26/2015 Recommended Consults Other (Comment) Oral Care Recommendations Oral care BID Other Recommendations Have oral suction available   CHL IP FOLLOW UP RECOMMENDATIONS 11/26/2015 Follow up Recommendations None   CHL IP FREQUENCY AND DURATION 11/26/2015 Speech Therapy Frequency (ACUTE ONLY) min 1 x/week Treatment Duration 1 week      CHL IP ORAL PHASE 11/26/2015 Oral Phase WFL Oral - Pudding Teaspoon -- Oral - Pudding Cup -- Oral - Honey Teaspoon -- Oral - Honey Cup -- Oral - Nectar Teaspoon -- Oral - Nectar Cup WFL Oral - Nectar Straw --  Oral - Thin Teaspoon -- Oral - Thin Cup WFL Oral - Thin Straw WFL Oral - Puree WFL Oral - Mech Soft WFL Oral - Regular -- Oral - Multi-Consistency -- Oral - Pill -- Oral Phase - Comment --  CHL IP PHARYNGEAL PHASE 11/26/2015 Pharyngeal Phase Impaired Pharyngeal- Pudding Teaspoon -- Pharyngeal -- Pharyngeal- Pudding Cup -- Pharyngeal -- Pharyngeal- Honey Teaspoon -- Pharyngeal -- Pharyngeal- Honey Cup -- Pharyngeal -- Pharyngeal- Nectar Teaspoon -- Pharyngeal -- Pharyngeal- Nectar Cup WFL Pharyngeal -- Pharyngeal- Nectar Straw -- Pharyngeal -- Pharyngeal- Thin Teaspoon -- Pharyngeal -- Pharyngeal- Thin Cup WFL Pharyngeal -- Pharyngeal- Thin Straw WFL Pharyngeal -- Pharyngeal- Puree WFL Pharyngeal -- Pharyngeal- Mechanical Soft -- Pharyngeal -- Pharyngeal- Regular WFL Pharyngeal -- Pharyngeal- Multi-consistency -- Pharyngeal -- Pharyngeal- Pill -- Pharyngeal -- Pharyngeal Comment --  CHL IP CERVICAL ESOPHAGEAL PHASE 11/26/2015 Cervical Esophageal Phase Impaired Pudding Teaspoon -- Pudding Cup -- Honey Teaspoon -- Honey Cup -- Nectar Teaspoon -- Nectar Cup Reduced cricopharyngeal relaxation;Esophageal backflow into the pharynx Nectar Straw -- Thin Teaspoon -- Thin Cup Reduced cricopharyngeal relaxation;Esophageal backflow into the pharynx Thin Straw Reduced cricopharyngeal relaxation;Esophageal backflow into the pharynx Puree Reduced  cricopharyngeal relaxation Mechanical Soft -- Regular Reduced cricopharyngeal relaxation Multi-consistency -- Pill -- Cervical Esophageal Comment appearance of Zenker's diverticulum resulting in backflow of liquids from cervical esophagus into pharynx WITHOUT pt awareness, cued dry swallows helpful to decrease residuals but were difficult for pt to conduct Luanna Salk, MS [facility] SLP [phone]                   Scheduled Meds: . sodium chloride   Intravenous Once  . sodium chloride   Intravenous Once  . atorvastatin  10 mg Oral QHS  . cefTRIAXone (ROCEPHIN)  IV  1 g Intravenous Q24H  . Chlorhexidine Gluconate Cloth  6 each Topical Q0600  . diltiazem  180 mg Oral Daily  . docusate sodium  100 mg Oral BID  . enoxaparin (LOVENOX) injection  30 mg Subcutaneous Q24H  . feeding supplement (ENSURE ENLIVE)  237 mL Oral BID BM  . levothyroxine  112 mcg Oral QAC breakfast  . mirabegron ER  50 mg Oral Daily  . multivitamin with minerals  1 tablet Oral Daily  . mupirocin ointment  1 application Nasal BID  . pantoprazole  40 mg Oral Daily  . polyethylene glycol  17 g Oral Daily  . propafenone  150 mg Oral TID  . sertraline  25 mg Oral Daily  . Warfarin - Pharmacist Dosing Inpatient   Does not apply q1800   Continuous Infusions: . sodium chloride 50 mL/hr at 11/26/15 0150     LOS: 5 days    Time spent: 40 minutes.    Bonnell Public, MD [facility] Pager [phone].  If 7PM-7AM, please contact night-coverage [email or web address] Password [facility] 11/26/2015, 7:08 PM

## 2015-11-26 NOTE — Progress Notes (Signed)
CSW following for return to [facility] when medically ready. CSW has completed FL2 & will continue to follow and assist with return.    Raynaldo Opitz, [facility] Clinical Social Worker cell #: [phone]

## 2015-11-26 NOTE — Progress Notes (Signed)
ANTICOAGULATION CONSULT NOTE - Initial Consult  Pharmacy Consult for warfarin Indication: atrial fibrillation  Allergies  Allergen Reactions  . Demerol Nausea And Vomiting  . Keflet [Cephalexin] Nausea And Vomiting  . Sulfa Antibiotics Nausea And Vomiting  . Tequin Nausea Only  . Penicillins Rash    Patient Measurements: Height: 5\' 5"  (165.1 cm) Weight: 155 lb (70.3 kg) IBW/kg (Calculated) : 57  Vital Signs: Temp: 99.5 F (37.5 C) (07/24 0600) Temp Source: Oral (07/24 0600) BP: 133/64 (07/24 0600) Pulse Rate: 80 (07/24 0600)  Labs:  Recent Labs  11/24/15 0442 11/25/15 0431 11/26/15 0410  HGB 9.8* 7.5* 8.3*  HCT 29.2* 23.0* 24.6*  PLT 136* 142* 176  LABPROT 17.1* 16.7* 19.0*  INR 1.39 1.34 1.60*  CREATININE 0.48 0.58 0.45    Estimated Creatinine Clearance: 46.9 mL/min (by C-G formula based on SCr of 0.8 mg/dL).   Medical History: Past Medical History:  Diagnosis Date  . A-fib ([place])   . Cancer ([place])    left breast  . Constipation   . GERD (gastroesophageal reflux disease)   . GERD (gastroesophageal reflux disease)   . HLD (hyperlipidemia)   . Hx of radiation therapy 2001   left breast  . Hypertension   . Hypothyroidism     Medications:  Prescriptions Prior to Admission  Medication Sig Dispense Refill Last Dose  . atorvastatin (LIPITOR) 10 MG tablet Take 10 mg by mouth at bedtime.    11/20/2015 at Unknown time  . Cholecalciferol (VITAMIN D) 2000 UNITS tablet Take 2,000 Units by mouth daily.   11/21/2015 at Unknown time  . [employer] STOOL SOFTENER 100 MG capsule Take 1 capsule by mouth 2 (two) times daily as needed for moderate constipation.   11 unknown  . diltiazem (CARDIZEM CD) 180 MG 24 hr capsule Take 180 mg by mouth daily.    11/21/2015 at Unknown time  . levothyroxine (SYNTHROID, LEVOTHROID) 112 MCG tablet Take 112 mcg by mouth daily before breakfast.   11/21/2015 at Unknown time  . MYRBETRIQ 50 MG TB24 Take 50 mg by mouth daily.    11/21/2015 at Unknown  time  . pantoprazole (PROTONIX) 40 MG tablet Take 40 mg by mouth daily.   11/21/2015 at Unknown time  . polyethylene glycol (MIRALAX / GLYCOLAX) packet Take 17 g by mouth daily. 14 each 0 11/20/2015 at Unknown time  . propafenone (RYTHMOL) 150 MG tablet Take 150 mg by mouth 3 (three) times daily.   11/21/2015 at Unknown time  . sertraline (ZOLOFT) 25 MG tablet Take 25 mg by mouth daily.    11/21/2015 at Unknown time  . UNABLE TO FIND Take 60 mLs by mouth 3 (three) times daily. Med Name:MedPass 2.0   11/21/2015 at Unknown time  . warfarin (COUMADIN) 2.5 MG tablet Take 2.5 mg by mouth 4 (four) times a week. Tues, Thursday, Sat, and Sun   11/20/2015 at Unknown time  . warfarin (COUMADIN) 5 MG tablet Take 5 mg by mouth as directed. Take 1 tablet(5 mg) on Mon, Wed, Fri   11/21/2015 at 1700    Assessment: 82yo F on warfarin PTA for Afib., admitted with L hip fracture. Warfarin held for surgery and received Vit K 5mg  PO x 1 on 7/19 PM and 7/20. S/p L hip repair on 7/21. Pharmacy is asked to resume warfarin dosing.   Home warfarin dose: 2.5 mg daily except 5 mg on MWF  Today, 11/26/15:  INR is sub-therapeutic but increased from 1.34 to 1.60 today after one dose  of warfarin 5 mg given on 7/23  On Lovenox 30mg  q24h (conservative dose presumably for advanced age and post-op) until INR therapeutic.  hgb and plt low but somewhat stable  NPO  Drug-interactions: Propafenone can increase response to warfarin but she has been on it with warfarin at home. IV abx   Goal of Therapy:  INR 2-3 Monitor platelets by anticoagulation protocol: Yes   Plan:   warfarin 5mg  PO x 1 (home dose)  Daily INR.  Continue lovenox 30 mg SQ q24h until INR > 2  If discharged today, recommend resuming home regimen and checking INR daily at the SNF with instructions to stop Lovenox once the INR is 2 or greater.   Dia Sitter, PharmD, BCPS 11/26/2015 9:32 AM

## 2015-11-26 NOTE — Procedures (Addendum)
Objective Swallowing Evaluation: Type of Study: MBS-Modified Barium Swallow Study  Patient Details  Name: Shannon [name] MRN: [id number] Date of Birth: Mar 20, 1925  Today's Date: 11/26/2015 Time: SLP Start Time (ACUTE ONLY): 0815-SLP Stop Time (ACUTE ONLY): 0850 SLP Time Calculation (min) (ACUTE ONLY): 35 min  Past Medical History:  Past Medical History:  Diagnosis Date  . A-fib ([place])   . Cancer ([place])    left breast  . Constipation   . GERD (gastroesophageal reflux disease)   . GERD (gastroesophageal reflux disease)   . HLD (hyperlipidemia)   . Hx of radiation therapy 2001   left breast  . Hypertension   . Hypothyroidism    Past Surgical History:  Past Surgical History:  Procedure Laterality Date  . BREAST SURGERY    . INTRAMEDULLARY (IM) NAIL INTERTROCHANTERIC Right 09/24/2014   Procedure: INTRAMEDULLARY (IM) NAIL INTERTROCHANTRIC;  Surgeon: Meredith Pel, MD;  Location: [facility] ORS;  Service: Orthopedics;  Laterality: Right;   HPI: 82 year old female, PMH of dementia, paroxysmal A. fib on chronic Coumadin anticoagulation, GERD, HTN, hypothyroid, breast cancer status post XRT, HLD, presented to [facility] ED on 7/20 following accidental fall at home/? ALF and noted to have comminuted angulated intertrochanteric fracture of the left femur. Underwent surgical repair 7/22.  S/p surgery pt noted to be coughing with fluids; CXR clear.  Lungs with course crackles.   Subjective: pt awake in chair  Assessment / Plan / Recommendation  CHL IP CLINICAL IMPRESSIONS 11/26/2015  Therapy Diagnosis Severe cervical esophageal phase dysphagia;Moderate cervical esophageal phase dysphagia  Clinical Impression Pt presents with moderately severe cervical esophageal dysphagia with suspected Zenker's diverticulum (please see images). Radiologist not present to confirm.   Pt with poor clearance of barium through cervical esophagus due stasis from diverticulum with resultant backflow into  pharynx/pyriform sinus WITHOUT pt sensation.  Cued dry swallows faciliate clearance but are marginally difficult for pt to perform.   Pt likely with clear liquids better although aspiration risk will be chronic.  Of note, pt did NOT cough or aspirate during MBS but precautions were taken to facilltate clearance.  Oropharyngeal swallow was largely intact.  This deficit is likely not new but pt largely denies dysphagia symptoms indicating poor sensation.  Using live video and teach back, educated pt to findings and precautions.    Impact on safety and function Moderate aspiration risk      CHL IP TREATMENT RECOMMENDATION 11/24/2015  Treatment Recommendations Therapy as outlined in treatment plan below     Prognosis 11/26/2015  Prognosis for Safe Diet Advancement Guarded  Barriers to Reach Goals Time post onset  Barriers/Prognosis Comment --    CHL IP DIET RECOMMENDATION 11/26/2015  SLP Diet Recommendations Thin liquid  Liquid Administration via Cup;Straw  Medication Administration Crushed with puree  Compensations Slow rate;Small sips/bites;Multiple dry swallows after each bite/sip  Postural Changes Seated upright at 90 degrees;Remain semi-upright after after feeds/meals (Comment)      CHL IP OTHER RECOMMENDATIONS 11/26/2015  Recommended Consults Other (Comment)  Oral Care Recommendations Oral care BID  Other Recommendations Have oral suction available      CHL IP FOLLOW UP RECOMMENDATIONS 11/26/2015  Follow up Recommendations None      CHL IP FREQUENCY AND DURATION 11/26/2015  Speech Therapy Frequency (ACUTE ONLY) min 1 x/week  Treatment Duration 1 week           CHL IP ORAL PHASE 11/26/2015  Oral Phase WFL  Oral - Pudding Teaspoon --  Oral - Pudding Cup --  Oral - Honey Teaspoon --  Oral - Honey Cup --  Oral - Nectar Teaspoon --  Oral - Nectar Cup WFL  Oral - Nectar Straw --  Oral - Thin Teaspoon --  Oral - Thin Cup WFL  Oral - Thin Straw WFL  Oral - Puree WFL  Oral - Mech  Soft WFL  Oral - Regular --  Oral - Multi-Consistency --  Oral - Pill --  Oral Phase - Comment --    CHL IP PHARYNGEAL PHASE 11/26/2015  Pharyngeal Phase Impaired  Pharyngeal- Pudding Teaspoon --  Pharyngeal --  Pharyngeal- Pudding Cup --  Pharyngeal --  Pharyngeal- Honey Teaspoon --  Pharyngeal --  Pharyngeal- Honey Cup --  Pharyngeal --  Pharyngeal- Nectar Teaspoon --  Pharyngeal --  Pharyngeal- Nectar Cup WFL  Pharyngeal --  Pharyngeal- Nectar Straw --  Pharyngeal --  Pharyngeal- Thin Teaspoon --  Pharyngeal --  Pharyngeal- Thin Cup WFL  Pharyngeal --  Pharyngeal- Thin Straw WFL  Pharyngeal --  Pharyngeal- Puree WFL  Pharyngeal --  Pharyngeal- Mechanical Soft --  Pharyngeal --  Pharyngeal- Regular WFL  Pharyngeal --  Pharyngeal- Multi-consistency --  Pharyngeal --  Pharyngeal- Pill --  Pharyngeal --  Pharyngeal Comment --     CHL IP CERVICAL ESOPHAGEAL PHASE 11/26/2015  Cervical Esophageal Phase Impaired  Pudding Teaspoon --  Pudding Cup --  Honey Teaspoon --  Honey Cup --  Nectar Teaspoon --  Nectar Cup Reduced cricopharyngeal relaxation;Esophageal backflow into the pharynx  Nectar Straw --  Thin Teaspoon --  Thin Cup Reduced cricopharyngeal relaxation;Esophageal backflow into the pharynx  Thin Straw Reduced cricopharyngeal relaxation;Esophageal backflow into the pharynx  Puree Reduced cricopharyngeal relaxation  Mechanical Soft --  Regular Reduced cricopharyngeal relaxation  Multi-consistency --  Pill --  Cervical Esophageal Comment appearance of Zenker's diverticulum resulting in backflow of puree and liquids from cervical esophagus into pharynx WITHOUT pt awareness, cued dry swallows helpful to decrease residuals but were difficult for pt to conduct    No flowsheet data found. Luanna Salk, [place] [facility] SLP [phone]

## 2015-11-26 NOTE — Progress Notes (Signed)
Speech Language Pathology Treatment: Dysphagia  Patient Details Name: Shannon [name] MRN: [id number] DOB: 12-13-1924 Today's Date: 11/26/2015 Time: [id number] SLP Time Calculation (min) (ACUTE ONLY): 20 min  Assessment / Plan / Recommendation Clinical Impression  Educated pt and spouse to results of MBS = showing video of today's study- and reinforced effective compensation strategies.  Using teach back, pt able to verbalize results of study.    Provided mitigation strategies in writing and verbally.   Provided pt with water per her request and reinforced effective strategies.    Also advised family/patient to chronicity of dysphagia suspected with tolerance until this fall.  SLP will follow up for tolerance, family education and reinforcement of strategies. Spoke to RN and NT to inform this of plans.      HPI HPI: 82 year old female, PMH of dementia, paroxysmal A. fib on chronic Coumadin anticoagulation, GERD, HTN, hypothyroid, breast cancer status post XRT, HLD, presented to [facility] ED on 7/20 following accidental fall at home/? ALF and noted to have comminuted angulated intertrochanteric fracture of the left femur. Underwent surgical repair 7/22.  S/p surgery pt noted to be coughing with fluids; CXR clear.  Lungs with course crackles.       SLP Plan  Continue with current plan of care     Recommendations  Diet recommendations: Nectar-thick liquid;Thin liquid (full liquids only at this time) Liquids provided via: Cup;Straw Medication Administration: Crushed with puree Supervision: Full supervision/cueing for compensatory strategies Compensations: Slow rate;Small sips/bites;Multiple dry swallows after each bite/sip;Follow solids with liquid Postural Changes and/or Swallow Maneuvers: Seated upright 90 degrees;Upright 30-60 min after meal             Oral Care Recommendations: Oral care BID Follow up Recommendations: Skilled Nursing facility Plan: Continue with current  plan of care     [place], [place] [facility] SLP [phone]

## 2015-11-26 NOTE — Progress Notes (Signed)
Physical Therapy Treatment Patient Details Name: Shannon [name] MRN: [id number] DOB: 05-Feb-1925 Today's Date: 11/26/2015    History of Present Illness Shannon [name] is a 82 y.o. woman with a history of dementia, paroxysmal atrial fibrillation , HTN, and breast cancer who presents to the ED 11/21/15 for evaluation of hip pain after having an accidental fall out of [place] at  [facility]. sustained R intertrochanteric femur/hip fracture. s/P ORIF with IM nail.    PT Comments    POD # 3 Performed side to side rolling bed mobility for hygiene.  Unable to attempt EOB due to pain level.  AAROM L LE was limited.  Used Maxi Move to assist  pt OOB to recliner.  Increased time to position to comfort.   Follow Up Recommendations  SNF     Equipment Recommendations       Recommendations for Other Services       Precautions / Restrictions Precautions Precautions: Fall Restrictions Weight Bearing Restrictions: Yes LLE Weight Bearing: Non weight bearing    Mobility  Bed Mobility Overal bed mobility: Needs Assistance;+2 for physical assistance;+ 2 for safety/equipment (pt 5%) Bed Mobility: Rolling;Supine to Sit;Sit to Supine Rolling: Total assist;+2 for physical assistance;+2 for safety/equipment   Supine to sit: Total assist;+2 for physical assistance;+2 for safety/equipment     General bed mobility comments: + 2 Total Assist side to side rolling.  Unable to attempt EOB sitting due to pain level.    Transfers                 General transfer comment: used Maxi Move to assist pt OOB to recliner  Ambulation/Gait                 Stairs            Wheelchair Mobility    Modified Rankin (Stroke Patients Only)       Balance                                    Cognition                            Exercises  Gentle AAROM L LE limited by pain    General Comments        Pertinent Vitals/Pain Pain Assessment: Faces Faces Pain Scale:  Hurts even more Pain Location: R hip with activity      Home Living                      Prior Function            PT Goals (current goals can now be found in the care plan section) Progress towards PT goals: Progressing toward goals    Frequency  Min 2X/week    PT Plan Current plan remains appropriate    Co-evaluation             End of Session   Activity Tolerance: Patient limited by fatigue Patient left: in chair;with call bell/phone within reach;with chair alarm set     Time: [id number] PT Time Calculation (min) (ACUTE ONLY): 28 min  Charges:  $Therapeutic Activity: 23-37 mins                    G Codes:      Rica Koyanagi  PTA [facility]  Acute  Rehab  Pager      [phone]

## 2015-11-26 NOTE — Progress Notes (Signed)
Patient stable Minimal pain with left hip range of motion Okay to discharge from orthosis standpoint once medical issues are resolved Speech evaluation today

## 2015-11-27 LAB — URINE CULTURE: Culture: >=100000 — AB

## 2015-11-27 LAB — PROTIME-INR
INR: 2.5 — AB (ref 0.00–1.49)
PROTHROMBIN TIME: 26.7 s — AB (ref 11.6–15.2)

## 2015-11-27 NOTE — Progress Notes (Signed)
PROGRESS NOTE  Shannon [name]  [id number] DOB: 1925-04-06  DOA: 11/21/2015 PCP: [name], MD   Brief Narrative:  82 year old female, PMH of dementia, paroxysmal A. fib on chronic Coumadin anticoagulation, GERD, HTN, hypothyroid, breast cancer status post XRT, HLD, presented to [facility] ED on 7/20 following accidental fall at home/? ALF and noted to have comminuted angulated intertrochanteric fracture of the left femur. Orthopedics was consulted and planned surgical fixation on 7/21 pending improvement in INR.   Assessment & Plan:   Principal Problem:   Hip fracture ([place]) Active Problems:   A-fib (HCC)   Warfarin-induced coagulopathy (HCC)   Dementia   At high risk for aspiration   Left hip fracture - S/P surgery. Ortho is directing care. On S/C lovenox.  Paroxysmal atrial fibrillation - Warfarin re-started. - Currently in sinus rhythm. Continue propafenone on and diltiazem. Coumadin held and INR being reversed for surgery. Repeat her dose of vitamin K 5 mg 1. - EKG 7/20: Normal sinus rhythm, LAD, LAFB, nonspecific T-wave abnormalities and no acute changes. QTC 459 ms - LVEF 09/20/15: 60-65 percent. - CHADS-VASC score of 4  ASPIRATION RISK - Slowly improving. Likely DC in am.  Hypothyroid - Continue Synthroid  Hyperlipidemia - Continue statin  GERD -Continue PPI   Dementia - Mental status possibly at baseline. No agitation.  Anemia - Follow CBCs.   DVT prophylaxis:  SCD's Code Status: Full  Family Communication: None at bedside. Discussed with patient.  Disposition Plan: To be determined. Likely to SNF when medically stable.   Consultants:   Orthopedics   Procedures:   None  Antimicrobials:   None    Subjective: Nil new complaints. Tolerating liquid diet fine.   Objective:  Vitals:   11/26/15 0600 11/26/15 1441 11/26/15 2121 11/27/15 0624  BP: 133/64 (!) 119/51 (!) 129/54 (!) 118/50  Pulse: 80 72 75 67  Resp: 16 18 16  18   Temp: 99.5 F (37.5 C) 98.7 F (37.1 C) 98.5 F (36.9 C) 98.7 F (37.1 C)  TempSrc: Oral Oral Axillary Oral  SpO2: 97% 98% 100% 98%  Weight:      Height:        Intake/Output Summary (Last 24 hours) at 11/27/15 [id number] Last data filed at 11/26/15 1700  Gross per 24 hour  Intake              120 ml  Output                0 ml  Net              120 ml   Filed Weights   11/21/15 2235  Weight: 70.3 kg (155 lb)    Examination:  General exam: Pleasant elderly female lying comfortably supine in bed.  Respiratory system: Clear to auscultation. Respiratory effort normal. Cardiovascular system: S1 & S2 heard, RRR. No JVD, murmurs, rubs, gallops or clicks. No pedal edema. Gastrointestinal system: Abdomen is nondistended, soft and nontender. No organomegaly or masses felt. Normal bowel sounds heard. Central nervous system: Alert and oriented 2. No focal neurological deficits. Extremities: Symmetric 5 x 5 power In all limbs except left lower extremity with assessment limited by pain. Left lower extremity externally rotated  Skin: No rashes, lesions or ulcers Psychiatry: Judgement and insight appear normal. Mood & affect appropriate.     Data Reviewed: I have personally reviewed following labs and imaging studies  CBC:  Recent Labs Lab 11/21/15 1958 11/22/15 0500 11/23/15 [id number] 11/24/15 [id number] 11/25/15 0431  11/26/15 0410  WBC 13.0* 9.0 9.4 10.5 8.3 7.8  NEUTROABS 10.9*  --   --   --   --  5.7  HGB 12.3 10.2* 7.8* 9.8* 7.5* 8.3*  HCT 37.3 31.0* 23.9* 29.2* 23.0* 24.6*  MCV 93.3 94.2 95.2 91.0 93.9 91.4  PLT 227 190 142* 136* 142* 0000000   Basic Metabolic Panel:  Recent Labs Lab 11/21/15 1958 11/22/15 0500 11/24/15 0442 11/25/15 0431 11/26/15 0410  NA 139 138 138 139 141  K 3.8 4.2 3.6 3.3* 3.3*  CL 107 107 105 106 109  CO2 26 26 29 29 26   GLUCOSE 141* 154* 122* 101* 88  BUN 20 16 10 15 18   CREATININE 0.77 0.62 0.48 0.58 0.45  CALCIUM 9.8 9.4 8.7* 8.4* 8.9  PHOS   --   --   --   --  2.2*   GFR: Estimated Creatinine Clearance: 46.9 mL/min (by C-G formula based on SCr of 0.8 mg/dL). Liver Function Tests:  Recent Labs Lab 11/22/15 0500 11/26/15 0410  AST 20  --   ALT 16  --   ALKPHOS 63  --   BILITOT 0.8  --   PROT 5.8*  --   ALBUMIN 3.2* 2.5*   No results for input(s): LIPASE, AMYLASE in the last 168 hours. No results for input(s): AMMONIA in the last 168 hours. Coagulation Profile:  Recent Labs Lab 11/23/15 0426 11/24/15 0442 11/25/15 0431 11/26/15 0410 11/27/15 0417  INR 2.14* 1.39 1.34 1.60* 2.50*   Cardiac Enzymes: No results for input(s): CKTOTAL, CKMB, CKMBINDEX, TROPONINI in the last 168 hours. BNP (last 3 results) No results for input(s): PROBNP in the last 8760 hours. HbA1C: No results for input(s): HGBA1C in the last 72 hours. CBG: No results for input(s): GLUCAP in the last 168 hours. Lipid Profile: No results for input(s): CHOL, HDL, LDLCALC, TRIG, CHOLHDL, LDLDIRECT in the last 72 hours. Thyroid Function Tests: No results for input(s): TSH, T4TOTAL, FREET4, T3FREE, THYROIDAB in the last 72 hours. Anemia Panel: No results for input(s): VITAMINB12, FOLATE, FERRITIN, TIBC, IRON, RETICCTPCT in the last 72 hours.  Sepsis Labs: No results for input(s): PROCALCITON, LATICACIDVEN in the last 168 hours.  Recent Results (from the past 240 hour(s))  Surgical PCR screen     Status: Abnormal   Collection Time: 11/23/15  4:35 AM  Result Value Ref Range Status   MRSA, PCR POSITIVE (A) NEGATIVE Final    Comment: RESULT CALLED TO, READ BACK BY AND VERIFIED WITH: K. SILK RN AT 0945 ON 07.21.17 BY SHUEA    Staphylococcus aureus POSITIVE (A) NEGATIVE Final    Comment:        The Xpert SA Assay (FDA approved for NASAL specimens in patients over 64 years of age), is one component of a comprehensive surveillance program.  Test performance has been validated by [facility] for patients greater than or equal to 80 year  old. It is not intended to diagnose infection nor to guide or monitor treatment.   Culture, Urine     Status: Abnormal   Collection Time: 11/24/15 10:53 AM  Result Value Ref Range Status   Specimen Description URINE, CATHETERIZED  Final   Special Requests NONE  Final   Culture >=100,000 COLONIES/mL KLEBSIELLA PNEUMONIAE (A)  Final   Report Status 11/27/2015 FINAL  Final   Organism ID, Bacteria KLEBSIELLA PNEUMONIAE (A)  Final      Susceptibility   Klebsiella pneumoniae - MIC*    AMPICILLIN 16 RESISTANT Resistant  CEFAZOLIN <=4 SENSITIVE Sensitive     CEFTRIAXONE <=1 SENSITIVE Sensitive     CIPROFLOXACIN <=0.25 SENSITIVE Sensitive     GENTAMICIN <=1 SENSITIVE Sensitive     IMIPENEM 0.5 SENSITIVE Sensitive     NITROFURANTOIN 128 RESISTANT Resistant     TRIMETH/SULFA >=320 RESISTANT Resistant     AMPICILLIN/SULBACTAM 4 SENSITIVE Sensitive     PIP/TAZO <=4 SENSITIVE Sensitive     * >=100,000 COLONIES/mL KLEBSIELLA PNEUMONIAE         Radiology Studies: Dg Swallowing Func-speech Pathology  Result Date: 11/26/2015 Objective Swallowing Evaluation: Type of Study: MBS-Modified Barium Swallow Study Patient Details Name: Shannon [name] MRN: [id number] Date of Birth: Sep 08, 1924 Today's Date: 11/26/2015 Time: SLP Start Time (ACUTE ONLY): 0815-SLP Stop Time (ACUTE ONLY): 0850 SLP Time Calculation (min) (ACUTE ONLY): 35 min Past Medical History: Past Medical History: Diagnosis Date . A-fib ([place])  . Cancer ([place])   left breast . Constipation  . GERD (gastroesophageal reflux disease)  . GERD (gastroesophageal reflux disease)  . HLD (hyperlipidemia)  . Hx of radiation therapy 2001  left breast . Hypertension  . Hypothyroidism  Past Surgical History: Past Surgical History: Procedure Laterality Date . BREAST SURGERY   . INTRAMEDULLARY (IM) NAIL INTERTROCHANTERIC Right 09/24/2014  Procedure: INTRAMEDULLARY (IM) NAIL INTERTROCHANTRIC;  Surgeon: Meredith Pel, MD;  Location: [facility] ORS;  Service:  Orthopedics;  Laterality: Right; HPI: 82 year old female, PMH of dementia, paroxysmal A. fib on chronic Coumadin anticoagulation, GERD, HTN, hypothyroid, breast cancer status post XRT, HLD, presented to [facility] ED on 7/20 following accidental fall at home/? ALF and noted to have comminuted angulated intertrochanteric fracture of the left femur. Underwent surgical repair 7/22.  S/p surgery pt noted to be coughing with fluids; CXR clear.  Lungs with course crackles.  Subjective: pt awake in chair Assessment / Plan / Recommendation CHL IP CLINICAL IMPRESSIONS 11/26/2015 Therapy Diagnosis Severe cervical esophageal phase dysphagia;Moderate cervical esophageal phase dysphagia Clinical Impression Pt presents with moderately severe cervical esophageal dysphagia with suspected Zenker's diverticulum (please see images). Radiologist not present to confirm.   Pt with poor clearance of barium through cervical esophagus due stasis from diverticulum with resultant backflow into pharynx/pyriform sinus WITHOUT pt sensation.  Cued dry swallows faciliate clearance but are marginally difficult for pt to perform.   Pt likely with clear liquids better although aspiration risk will be chronic.  Of note, pt did NOT cough or aspirate during MBS but precautions were taken to facilltate clearance.  Oropharyngeal swallow was largely intact.  This deficit is likely not new but pt largely denies dysphagia symptoms indicating poor sensation.  Using live video and teach back, educated pt to findings and precautions.   Impact on safety and function -moderately severe risk   CHL IP TREATMENT RECOMMENDATION 11/24/2015 Treatment Recommendations Therapy as outlined in treatment plan below   Prognosis 11/26/2015 Prognosis for Safe Diet Advancement Guarded Barriers to Reach Goals Time post onset Barriers/Prognosis Comment -- CHL IP DIET RECOMMENDATION 11/26/2015 SLP Diet Recommendations Thin liquid Liquid Administration via Cup;Straw Medication  Administration Crushed with puree Compensations Slow rate;Small sips/bites;Multiple dry swallows after each bite/sip Postural Changes Seated upright at 90 degrees;Remain semi-upright after after feeds/meals (Comment)   CHL IP OTHER RECOMMENDATIONS 11/26/2015 Recommended Consults Other (Comment) Oral Care Recommendations Oral care BID Other Recommendations Have oral suction available   CHL IP FOLLOW UP RECOMMENDATIONS 11/26/2015 Follow up Recommendations None   CHL IP FREQUENCY AND DURATION 11/26/2015 Speech Therapy Frequency (ACUTE ONLY) min 1 x/week Treatment Duration  1 week      CHL IP ORAL PHASE 11/26/2015 Oral Phase WFL Oral - Pudding Teaspoon -- Oral - Pudding Cup -- Oral - Honey Teaspoon -- Oral - Honey Cup -- Oral - Nectar Teaspoon -- Oral - Nectar Cup WFL Oral - Nectar Straw -- Oral - Thin Teaspoon -- Oral - Thin Cup WFL Oral - Thin Straw WFL Oral - Puree WFL Oral - Mech Soft WFL Oral - Regular -- Oral - Multi-Consistency -- Oral - Pill -- Oral Phase - Comment --  CHL IP PHARYNGEAL PHASE 11/26/2015 Pharyngeal Phase Impaired Pharyngeal- Pudding Teaspoon -- Pharyngeal -- Pharyngeal- Pudding Cup -- Pharyngeal -- Pharyngeal- Honey Teaspoon -- Pharyngeal -- Pharyngeal- Honey Cup -- Pharyngeal -- Pharyngeal- Nectar Teaspoon -- Pharyngeal -- Pharyngeal- Nectar Cup WFL Pharyngeal -- Pharyngeal- Nectar Straw -- Pharyngeal -- Pharyngeal- Thin Teaspoon -- Pharyngeal -- Pharyngeal- Thin Cup WFL Pharyngeal -- Pharyngeal- Thin Straw WFL Pharyngeal -- Pharyngeal- Puree WFL Pharyngeal -- Pharyngeal- Mechanical Soft -- Pharyngeal -- Pharyngeal- Regular WFL Pharyngeal -- Pharyngeal- Multi-consistency -- Pharyngeal -- Pharyngeal- Pill -- Pharyngeal -- Pharyngeal Comment --  CHL IP CERVICAL ESOPHAGEAL PHASE 11/26/2015 Cervical Esophageal Phase Impaired Pudding Teaspoon -- Pudding Cup -- Honey Teaspoon -- Honey Cup -- Nectar Teaspoon -- Nectar Cup Reduced cricopharyngeal relaxation;Esophageal backflow into the pharynx Nectar Straw --  Thin Teaspoon -- Thin Cup Reduced cricopharyngeal relaxation;Esophageal backflow into the pharynx Thin Straw Reduced cricopharyngeal relaxation;Esophageal backflow into the pharynx Puree Reduced cricopharyngeal relaxation Mechanical Soft -- Regular Reduced cricopharyngeal relaxation Multi-consistency -- Pill -- Cervical Esophageal Comment appearance of Zenker's diverticulum resulting in backflow of liquids from cervical esophagus into pharynx WITHOUT pt awareness, cued dry swallows helpful to decrease residuals but were difficult for pt to conduct Luanna Salk, MS [facility] SLP [phone]                   Scheduled Meds: . sodium chloride   Intravenous Once  . sodium chloride   Intravenous Once  . atorvastatin  10 mg Oral QHS  . cefTRIAXone (ROCEPHIN)  IV  1 g Intravenous Q24H  . Chlorhexidine Gluconate Cloth  6 each Topical Q0600  . diltiazem  180 mg Oral Daily  . docusate sodium  100 mg Oral BID  . feeding supplement (ENSURE ENLIVE)  237 mL Oral BID BM  . levothyroxine  112 mcg Oral QAC breakfast  . mirabegron ER  50 mg Oral Daily  . multivitamin with minerals  1 tablet Oral Daily  . mupirocin ointment  1 application Nasal BID  . pantoprazole  40 mg Oral Daily  . polyethylene glycol  17 g Oral Daily  . propafenone  150 mg Oral TID  . sertraline  25 mg Oral Daily  . Warfarin - Pharmacist Dosing Inpatient   Does not apply q1800   Continuous Infusions: . sodium chloride 50 mL/hr at 11/26/15 2255     LOS: 6 days    Time spent: 40 minutes.    Bonnell Public, MD [facility] Pager [phone].  If 7PM-7AM, please contact night-coverage [email or web address] Password [facility] 11/27/2015, 9:04 AM

## 2015-11-27 NOTE — Progress Notes (Signed)
ANTICOAGULATION CONSULT NOTE - Initial Consult  Pharmacy Consult for warfarin Indication: atrial fibrillation  Allergies  Allergen Reactions  . Demerol Nausea And Vomiting  . Keflet [Cephalexin] Nausea And Vomiting  . Sulfa Antibiotics Nausea And Vomiting  . Tequin Nausea Only  . Penicillins Rash    Patient Measurements: Height: 5\' 5"  (165.1 cm) Weight: 155 lb (70.3 kg) IBW/kg (Calculated) : 57  Vital Signs: Temp: 98.7 F (37.1 C) (07/25 0624) Temp Source: Oral (07/25 0624) BP: 118/50 (07/25 0624) Pulse Rate: 67 (07/25 0624)  Labs:  Recent Labs  11/25/15 0431 11/26/15 0410 11/27/15 0417  HGB 7.5* 8.3*  --   HCT 23.0* 24.6*  --   PLT 142* 176  --   LABPROT 16.7* 19.0* 26.7*  INR 1.34 1.60* 2.50*  CREATININE 0.58 0.45  --     Estimated Creatinine Clearance: 46.9 mL/min (by C-G formula based on SCr of 0.8 mg/dL).   Medical History: Past Medical History:  Diagnosis Date  . A-fib ([place])   . Cancer ([place])    left breast  . Constipation   . GERD (gastroesophageal reflux disease)   . GERD (gastroesophageal reflux disease)   . HLD (hyperlipidemia)   . Hx of radiation therapy 2001   left breast  . Hypertension   . Hypothyroidism     Medications:  Prescriptions Prior to Admission  Medication Sig Dispense Refill Last Dose  . atorvastatin (LIPITOR) 10 MG tablet Take 10 mg by mouth at bedtime.    11/20/2015 at Unknown time  . Cholecalciferol (VITAMIN D) 2000 UNITS tablet Take 2,000 Units by mouth daily.   11/21/2015 at Unknown time  . [employer] STOOL SOFTENER 100 MG capsule Take 1 capsule by mouth 2 (two) times daily as needed for moderate constipation.   11 unknown  . diltiazem (CARDIZEM CD) 180 MG 24 hr capsule Take 180 mg by mouth daily.    11/21/2015 at Unknown time  . levothyroxine (SYNTHROID, LEVOTHROID) 112 MCG tablet Take 112 mcg by mouth daily before breakfast.   11/21/2015 at Unknown time  . MYRBETRIQ 50 MG TB24 Take 50 mg by mouth daily.    11/21/2015 at Unknown  time  . pantoprazole (PROTONIX) 40 MG tablet Take 40 mg by mouth daily.   11/21/2015 at Unknown time  . polyethylene glycol (MIRALAX / GLYCOLAX) packet Take 17 g by mouth daily. 14 each 0 11/20/2015 at Unknown time  . propafenone (RYTHMOL) 150 MG tablet Take 150 mg by mouth 3 (three) times daily.   11/21/2015 at Unknown time  . sertraline (ZOLOFT) 25 MG tablet Take 25 mg by mouth daily.    11/21/2015 at Unknown time  . UNABLE TO FIND Take 60 mLs by mouth 3 (three) times daily. Med Name:MedPass 2.0   11/21/2015 at Unknown time  . warfarin (COUMADIN) 2.5 MG tablet Take 2.5 mg by mouth 4 (four) times a week. Tues, Thursday, Sat, and Sun   11/20/2015 at Unknown time  . warfarin (COUMADIN) 5 MG tablet Take 5 mg by mouth as directed. Take 1 tablet(5 mg) on Mon, Wed, Fri   11/21/2015 at 1700    Assessment: 82yo F on warfarin PTA for Afib., admitted with L hip fracture. Warfarin held for surgery and received Vit K 5mg  PO x 1 on 7/19 PM and 7/20. S/p L hip repair on 7/21. Pharmacy is asked to resume warfarin dosing.   Home warfarin dose: 2.5 mg daily except 5 mg on MWF  Today, 11/27/15:  INR now therapeutic but increased  sharply from 1.60 to 2.50 after two doses of 5 mg  Last hgb and plt low but somewhat stable on 7/25  Full liquid diet started on 7/25  Drug-interactions: Propafenone can increase response to warfarin but she has been on it with warfarin at home. IV abx   Goal of Therapy:  INR 2-3 Monitor platelets by anticoagulation protocol: Yes   Plan:   Hold warfarin dose today d/t sharp rise in INR.  F/u with INR on 7/26 and resume if/when appropriate.  If patient to get discharged today, recommend to check INR on 7/26 to determine if dose can be given.  Daily INR.  lovenox 30 mg SQ q24h d/ced now that INR is >2  Dia Sitter, PharmD, BCPS 11/27/2015 9:56 AM

## 2015-11-27 NOTE — Progress Notes (Signed)
Speech Language Pathology Treatment:    Patient Details Name: Shannon [name] MRN: [id number] DOB: 1924-09-23 Today's Date: 11/27/2015 Time: [id number] SLP Time Calculation (min) (ACUTE ONLY): 25 min  Assessment / Plan / Recommendation Clinical Impression  Pt today reports improved swallow and respiratory status today.    She denies any coughing associated with intake and is able to verbalize her compensation strategies.  SLP observed pt consuming ice cream and Ensure = pt did benefit from min cues to implement dry swallow.   No indication of aspiration noted with po observed.    RN reports pt slept much of the day and did not awaken until 10 am.  As pt is on liquid diet at this time, would recommend to consider Ensure TID *pt prefers chocolate.    If advancement in diet is indicate prior to dc, would recommend chopped Dys2/thin with strict precautions.  Follow up SLP indicated at facility to maximize least restrictive diet with compensation strategies.  Spouse and pt agreeable to plan and strategies reinforced.       HPI HPI: 82 year old female, PMH of dementia, paroxysmal A. fib on chronic Coumadin anticoagulation, GERD, HTN, hypothyroid, breast cancer status post XRT, HLD, presented to [facility] ED on 7/20 following accidental fall at home/? ALF and noted to have comminuted angulated intertrochanteric fracture of the left femur. Underwent surgical repair 7/22.  S/p surgery pt noted to be coughing with fluids; CXR clear.  Lungs with course crackles.       SLP Plan  Continue with current plan of care     Recommendations  Diet recommendations: Nectar-thick liquid;Thin liquid Liquids provided via: Straw Medication Administration: Crushed with puree Supervision: Patient able to self feed;Intermittent supervision to cue for compensatory strategies Compensations: Slow rate;Small sips/bites;Multiple dry swallows after each bite/sip Postural Changes and/or Swallow Maneuvers: Seated  upright 90 degrees;Upright 30-60 min after meal             Oral Care Recommendations: Oral care BID Follow up Recommendations: Skilled Nursing facility Plan: Continue with current plan of care     [place], [place] [facility] SLP [phone]

## 2015-11-28 ENCOUNTER — Encounter (HOSPITAL_COMMUNITY): Payer: Self-pay

## 2015-11-28 DIAGNOSIS — N390 Urinary tract infection, site not specified: Secondary | ICD-10-CM

## 2015-11-28 DIAGNOSIS — L8990 Pressure ulcer of unspecified site, unspecified stage: Secondary | ICD-10-CM

## 2015-11-28 LAB — PROTIME-INR
INR: 2.53
PROTHROMBIN TIME: 26.1 s — AB (ref 11.4–15.2)

## 2015-11-28 MED ORDER — ENSURE ENLIVE PO LIQD: 237.0000 mL | Freq: Two times a day (BID) | ORAL | 12 refills | Status: DC

## 2015-11-28 NOTE — Progress Notes (Signed)
This RN split the patient's larger pills in half but left the small ones whole and let the patient take them one at a time. Patient did very well with no episodes of coughing or noticeable difficulty swallowing. Will continue to monitor.

## 2015-11-28 NOTE — Progress Notes (Signed)
[facility] for warfarin Indication: atrial fibrillation  Allergies  Allergen Reactions  . Demerol Nausea And Vomiting  . Keflet [Cephalexin] Nausea And Vomiting  . Sulfa Antibiotics Nausea And Vomiting  . Tequin Nausea Only  . Penicillins Rash    Patient Measurements: Height: 5\' 5"  (165.1 cm) Weight: 155 lb (70.3 kg) IBW/kg (Calculated) : 57  Vital Signs: Temp: 98.1 F (36.7 C) (07/26 0642) Temp Source: Oral (07/26 0642) BP: 132/68 (07/26 [id number]) Pulse Rate: 83 (07/26 0642)  Labs:  Recent Labs  11/26/15 0410 11/27/15 0417 11/28/15 0420  HGB 8.3*  --   --   HCT 24.6*  --   --   PLT 176  --   --   LABPROT 19.0* 26.7* 26.1*  INR 1.60* 2.50* 2.53  CREATININE 0.45  --   --     Estimated Creatinine Clearance: 46.9 mL/min (by C-G formula based on SCr of 0.8 mg/dL).  Medications:  Prescriptions Prior to Admission  Medication Sig Dispense Refill Last Dose  . atorvastatin (LIPITOR) 10 MG tablet Take 10 mg by mouth at bedtime.    11/20/2015 at Unknown time  . Cholecalciferol (VITAMIN D) 2000 UNITS tablet Take 2,000 Units by mouth daily.   11/21/2015 at Unknown time  . CVS STOOL SOFTENER 100 MG capsule Take 1 capsule by mouth 2 (two) times daily as needed for moderate constipation.   11 unknown  . diltiazem (CARDIZEM CD) 180 MG 24 hr capsule Take 180 mg by mouth daily.    11/21/2015 at Unknown time  . levothyroxine (SYNTHROID, LEVOTHROID) 112 MCG tablet Take 112 mcg by mouth daily before breakfast.   11/21/2015 at Unknown time  . MYRBETRIQ 50 MG TB24 Take 50 mg by mouth daily.    11/21/2015 at Unknown time  . pantoprazole (PROTONIX) 40 MG tablet Take 40 mg by mouth daily.   11/21/2015 at Unknown time  . polyethylene glycol (MIRALAX / GLYCOLAX) packet Take 17 g by mouth daily. 14 each 0 11/20/2015 at Unknown time  . propafenone (RYTHMOL) 150 MG tablet Take 150 mg by mouth 3 (three) times daily.   11/21/2015 at Unknown time  . sertraline (ZOLOFT) 25  MG tablet Take 25 mg by mouth daily.    11/21/2015 at Unknown time  . UNABLE TO FIND Take 60 mLs by mouth 3 (three) times daily. Med Name:MedPass 2.0   11/21/2015 at Unknown time  . warfarin (COUMADIN) 2.5 MG tablet Take 2.5 mg by mouth 4 (four) times a week. Tues, Thursday, Sat, and Sun   11/20/2015 at Unknown time  . warfarin (COUMADIN) 5 MG tablet Take 5 mg by mouth as directed. Take 1 tablet(5 mg) on Mon, Wed, Fri   11/21/2015 at 1700    Assessment: 82yo F on warfarin PTA for Afib., admitted with L hip fracture. Warfarin held for surgery and received Vit K 5mg  PO x 1 on 7/19 PM and 7/20. S/p L hip repair on 7/21. Pharmacy is asked to resume warfarin dosing.   Home warfarin dose: 2.5 mg daily except 5 mg on MWF  Today, 11/28/15:  INR now therapeutic, increased sharply yesterdayafter two doses of 5 mg but remains stable today  Last hgb and plt low but improved on 7/25  Full liquid diet started on 7/25  Drug-interactions: Propafenone can increase response to warfarin but she has been on it with warfarin at home. IV abx   Goal of Therapy:  INR 2-3 Monitor platelets by anticoagulation protocol: Yes  Plan:   Plans for discharge today with instructions for 5 mg MWF, 2.5 mg all other days  Agree with warfarin 5 mg today at SNF; recommend daily INR check for next 2 days however, given rapid changes in INR  Daily INR.  Reuel Boom, PharmD, BCPS Pager: [phone] 11/28/2015, 10:14 AM

## 2015-11-28 NOTE — Progress Notes (Signed)
Patient is set to discharge back to [facility] today. Patient & daughter-in-law, Surie made aware. Discharge packet given to RN, Judson Roch. [facility] called for transport.     Raynaldo Opitz, [facility] Clinical Social Worker cell #: [phone]

## 2015-11-28 NOTE — Progress Notes (Signed)
Speech Language Pathology Treatment: Dysphagia  Patient Details Name: Shannon [name] MRN: [id number] DOB: [birth year] Today's Date: 11/28/2015 Time: [id number] SLP Time Calculation (min) (ACUTE ONLY): 38 min  Assessment / Plan / Recommendation Clinical Impression  SlP received new order re: possible progression in diet prior to dc to SNF.  Pt in room without family present and she reports feeling better today with improved swallowing ability and overall strength.   SLP assisted pt to consume crackers and apple juice with Miralax via straw.  She required moderate verbal cues to follow compensation strategies to mitigate risk and aid clearance.  Throat clear note x2 during session - ? Secretion penetration or backflow of liquids from suspected diverticulum.  No overt indication of airway compromise.   Eating is laborious and time consuming for pt   And therefore recommend to maximize liquid nutritional intake.   Recommend to advance to dys2/thin with strict precautions, pt will benefit from full supervision to mitigate aspiration risk.  Please order follow up SLP at [facility] for management.  Pt to dc today and new swallow precaution signs provided to pt, pt educated to mitigation strategies with teach back.     HPI HPI: 82 year old female, PMH of dementia, paroxysmal A. fib on chronic Coumadin anticoagulation, GERD, HTN, hypothyroid, breast cancer status post XRT, HLD, presented to [facility] ED on 7/20 following accidental fall at home/? ALF and noted to have comminuted angulated intertrochanteric fracture of the left femur. Underwent surgical repair 7/22.  S/p surgery pt noted to be coughing with fluids; CXR clear.  Lungs with course crackles.       SLP Plan  Continue with current plan of care     Recommendations  Liquids provided via: Straw Medication Administration: Crushed with puree Supervision: Patient able to self feed;Intermittent supervision to cue for compensatory  strategies Compensations: Slow rate;Small sips/bites;Multiple dry swallows after each bite/sip Postural Changes and/or Swallow Maneuvers: Seated upright 90 degrees;Upright 30-60 min after meal             Oral Care Recommendations: Oral care QID Follow up Recommendations: Skilled Nursing facility Plan: Continue with current plan of care     [place], [place] [facility] [place] [phone]

## 2015-11-28 NOTE — Care Management Important Message (Signed)
Important Message  Patient Details  Name: KYLENA HOLLENSHEAD MRN: [id number] Date of Birth: 10/28/1924   Medicare Important Message Given:  Yes    Camillo Flaming 11/28/2015, 9:46 AMImportant Message  Patient Details  Name: SCOTLYNN SORG MRN: [id number] Date of Birth: 10/12/1924   Medicare Important Message Given:  Yes    Camillo Flaming 11/28/2015, 9:46 AM

## 2015-11-28 NOTE — Progress Notes (Signed)
RN called report to Webb Silversmith, Production assistant, radio at [employer]. All questions answered.   Paperwork sent with patient.  [facility] transported patient to SNF.

## 2015-11-28 NOTE — Discharge Summary (Signed)
Physician Discharge Summary  Shannon [name] [id number] DOB: [birth year] DOA: 11/21/2015  PCP: [name], MD  Admit date: 11/21/2015 Discharge date: 11/28/2015  Time spent: Greater than 30 minutes  Recommendations for Outpatient Follow-up:  1. Discharge patient to SNF [facility]) 2. Follow up with PCP within one week. 3. PCP and SNF to monitor PT/INR and adjust coumadin accordingly. 4. Follow up with the Orthopedic team (Dr. Alphonzo Severance) in 1-4 weeks. 5. Speech Therapy to follow and continue to assess and manage patient at the SNF. 6. Aspiration precautions.   Discharge Diagnoses:  Principal Problem:   Hip fracture ([place]) UTI secondary to Klebsiella Aspiration risk  Active Problems:   A-fib (HCC)   Warfarin-induced coagulopathy (HCC)   Dementia   At high risk for aspiration   Pressure ulcer   Discharge Condition: Optimized.  Diet recommendation: Liquid diet, advance as tolerated as advised by the Speech Therapist.  Filed Weights   11/21/15 2235  Weight: 70.3 kg (155 lb)    History of present illness: 82 year old female with history of dementia, paroxysmal atrial fibrillation (anticoagulated with warfarin, CHADS-VASC score of 4), HTN, and breast cancer who presents to the ED for evaluation of hip pain after having an accidental fall at her assisted living facility. Hip X-Ray done on admission revealed comminuted intertrochanteric left hip fracture.  Hospital Course: Patient was admitted for further assessment and management. Orthopedic team directed the management of the left hip fracture. Patient underwent open reduction and internal fixation with intramedullary hip screw of the left hip intertrochanteric and subtrochanteric femoral fracture. Patient's anticoagulation has been restarted. INR is optimized. There will be need to keep monitoring INR and adjusting patient's anticoagulation accordingly. Post op, patient had problems with swallowing, and was deemed to be at  risk for aspiration. Patient was initially kept NPO and had swallowing evaluation. Patient is now on liquid diet and will have diet advanced as advised by the speech therapy. Speech therapy will need to keep following patient on discharge to the SNF.  Urine culture revealed UTI secondary to klebsiella, and patient has completed course of IV Rocephin.  Procedures:  Open reduction and internal fixation of left hip fracture  Consultations:  Orthopedic (Dr. Alphonzo Severance)  Discharge Exam: Vitals:   11/28/15 0000 11/28/15 0642  BP:  132/68  Pulse: 84 83  Resp:  16  Temp:  98.1 F (36.7 C)    General: Not in distress. Awake and alert Cardiovascular: S1S2 Respiratory: Clear to auscultation.  Discharge Instructions   Discharge Instructions    Diet - low sodium heart healthy    Complete by:  As directed   Liquid diet and advance as tolerated.   Discharge instructions    Complete by:  As directed   Continue to advance diet as tolerated. Speech therapy to follow patient at the SNF. Follow up with the PCP within one week. Follow up with the Orthopedic team within 1-4 weeks.   Increase activity slowly    Complete by:  As directed   As per rehab team   Touch down weight bearing    Complete by:  As directed     Current Discharge Medication List    START taking these medications   Details  feeding supplement, ENSURE ENLIVE, (ENSURE ENLIVE) LIQD Take 237 mLs by mouth 2 (two) times daily between meals. Qty: 237 mL, Refills: 12    HYDROcodone-acetaminophen (NORCO/VICODIN) 5-325 MG tablet Take 1-2 tablets by mouth every 6 (six) hours as needed for moderate  pain. Qty: 30 tablet, Refills: 0      CONTINUE these medications which have NOT CHANGED   Details  atorvastatin (LIPITOR) 10 MG tablet Take 10 mg by mouth at bedtime.     Cholecalciferol (VITAMIN D) 2000 UNITS tablet Take 2,000 Units by mouth daily.    [employer] STOOL SOFTENER 100 MG capsule Take 1 capsule by mouth 2 (two) times daily as  needed for moderate constipation.  Refills: 11    diltiazem (CARDIZEM CD) 180 MG 24 hr capsule Take 180 mg by mouth daily.     levothyroxine (SYNTHROID, LEVOTHROID) 112 MCG tablet Take 112 mcg by mouth daily before breakfast.    MYRBETRIQ 50 MG TB24 Take 50 mg by mouth daily.     pantoprazole (PROTONIX) 40 MG tablet Take 40 mg by mouth daily.    polyethylene glycol (MIRALAX / GLYCOLAX) packet Take 17 g by mouth daily. Qty: 14 each, Refills: 0    propafenone (RYTHMOL) 150 MG tablet Take 150 mg by mouth 3 (three) times daily.    sertraline (ZOLOFT) 25 MG tablet Take 25 mg by mouth daily.     !! warfarin (COUMADIN) 2.5 MG tablet Take 2.5 mg by mouth 4 (four) times a week. Tues, Thursday, Sat, and Sun    !! warfarin (COUMADIN) 5 MG tablet Take 5 mg by mouth as directed. Take 1 tablet(5 mg) on Mon, Wed, Fri     !! - Potential duplicate medications found. Please discuss with provider.    STOP taking these medications     UNABLE TO FIND        Allergies  Allergen Reactions  . Demerol Nausea And Vomiting  . Keflet [Cephalexin] Nausea And Vomiting  . Sulfa Antibiotics Nausea And Vomiting  . Tequin Nausea Only  . Penicillins Rash   Follow-up Information    GATES,ROBERT NEVILL, MD Follow up in 1 week(s).   Specialty:  Internal Medicine Contact information: [address] [place] [address] [phone]        Meredith Pel, MD Follow up in 2 week(s).   Specialty:  Orthopedic Surgery Contact information: [place] [address] [phone]            The results of significant diagnostics from this hospitalization (including imaging, microbiology, ancillary and laboratory) are listed below for reference.    Significant Diagnostic Studies: Dg Chest 1 View  Result Date: 11/24/2015 CLINICAL DATA:  Shortness of breath, ORIF yesterday. EXAM: CHEST 1 VIEW COMPARISON:  11/24/2015. FINDINGS: Patient is rotated. Heart size is  stable. Thoracic aorta is calcified. Lungs are clear. No pleural fluid. IMPRESSION: 1. No acute findings. 2. Aortic atherosclerosis. Electronically Signed   By: Lorin Picket M.D.   On: 11/24/2015 13:38   Dg Chest 1 View  Result Date: 11/21/2015 CLINICAL DATA:  82 y/o F; left hip fracture. History of hypertension and atrial fibrillation. Ex-smoker. EXAM: CHEST 1 VIEW COMPARISON:  Chest radiograph dated 09/19/2015. FINDINGS: The heart size and mediastinal contours are within normal limits. Both lungs are clear. The visualized skeletal structures are unremarkable. IMPRESSION: No active disease. Electronically Signed   By: Kristine Garbe M.D.   On: 11/21/2015 19:51   Pelvis Portable  Result Date: 11/23/2015 CLINICAL DATA:  Hip fracture.  Initial encounter. EXAM: PORTABLE PELVIS 1-2 VIEWS COMPARISON:  11/21/2015 FINDINGS: There has been interval ORIF of the previously described intertrochanteric fracture of the left femur. An antegrade intramedullary nail is partially visualized with 2 proximal screws which terminate  in the femoral head. Fracture alignment appears near anatomic on this single AP image. The femoral heads are approximated with the acetabula on this single projection. An old right inferior pubic ramus fracture and prior internal fixation of the right femur are again noted. IMPRESSION: Interval internal fixation of intertrochanteric left femur fracture as above. Electronically Signed   By: Logan Bores M.D.   On: 11/23/2015 21:46   Dg Chest Port 1 View  Result Date: 11/24/2015 CLINICAL DATA:  Fluid overload, left breast cancer. EXAM: PORTABLE CHEST 1 VIEW COMPARISON:  11/21/2015. FINDINGS: Patient is slightly rotated. Trachea is midline. Heart size stable. Thoracic aorta is calcified. Lungs are clear. No pleural fluid. IMPRESSION: 1. No acute findings. 2. Aortic atherosclerosis. Electronically Signed   By: Lorin Picket M.D.   On: 11/24/2015 07:35   Dg Swallowing Func-speech  Pathology  Result Date: 11/26/2015 Objective Swallowing Evaluation: Type of Study: MBS-Modified Barium Swallow Study Patient Details Name: MARKEVIA SCHAPIRO MRN: [id number] Date of Birth: [birth year] Today's Date: 11/26/2015 Time: SLP Start Time (ACUTE ONLY): 0815-SLP Stop Time (ACUTE ONLY): 0850 SLP Time Calculation (min) (ACUTE ONLY): 35 min Past Medical History: Past Medical History: Diagnosis Date . A-fib ([place])  . Cancer ([place])   left breast . Constipation  . GERD (gastroesophageal reflux disease)  . GERD (gastroesophageal reflux disease)  . HLD (hyperlipidemia)  . Hx of radiation therapy 2001  left breast . Hypertension  . Hypothyroidism  Past Surgical History: Past Surgical History: Procedure Laterality Date . BREAST SURGERY   . INTRAMEDULLARY (IM) NAIL INTERTROCHANTERIC Right 09/24/2014  Procedure: INTRAMEDULLARY (IM) NAIL INTERTROCHANTRIC;  Surgeon: Meredith Pel, MD;  Location: [facility] ORS;  Service: Orthopedics;  Laterality: Right; HPI: 82 year old female, PMH of dementia, paroxysmal A. fib on chronic Coumadin anticoagulation, GERD, HTN, hypothyroid, breast cancer status post XRT, HLD, presented to [facility] ED on 7/20 following accidental fall at home/? ALF and noted to have comminuted angulated intertrochanteric fracture of the left femur. Underwent surgical repair 7/22.  S/p surgery pt noted to be coughing with fluids; CXR clear.  Lungs with course crackles.  Subjective: pt awake in chair Assessment / Plan / Recommendation CHL IP CLINICAL IMPRESSIONS 11/26/2015 Therapy Diagnosis Severe cervical esophageal phase dysphagia;Moderate cervical esophageal phase dysphagia Clinical Impression Pt presents with moderately severe cervical esophageal dysphagia with suspected Zenker's diverticulum (please see images). Radiologist not present to confirm.   Pt with poor clearance of barium through cervical esophagus due stasis from diverticulum with resultant backflow into pharynx/pyriform sinus WITHOUT pt  sensation.  Cued dry swallows faciliate clearance but are marginally difficult for pt to perform.   Pt likely with clear liquids better although aspiration risk will be chronic.  Of note, pt did NOT cough or aspirate during MBS but precautions were taken to facilltate clearance.  Oropharyngeal swallow was largely intact.  This deficit is likely not new but pt largely denies dysphagia symptoms indicating poor sensation.  Using live video and teach back, educated pt to findings and precautions.   Impact on safety and function -moderately severe risk   CHL IP TREATMENT RECOMMENDATION 11/24/2015 Treatment Recommendations Therapy as outlined in treatment plan below   Prognosis 11/26/2015 Prognosis for Safe Diet Advancement Guarded Barriers to Reach Goals Time post onset Barriers/Prognosis Comment -- CHL IP DIET RECOMMENDATION 11/26/2015 SLP Diet Recommendations Thin liquid Liquid Administration via Cup;Straw Medication Administration Crushed with puree Compensations Slow rate;Small sips/bites;Multiple dry swallows after each bite/sip Postural Changes Seated upright at 90 degrees;Remain semi-upright after after feeds/meals (  Comment)   CHL IP OTHER RECOMMENDATIONS 11/26/2015 Recommended Consults Other (Comment) Oral Care Recommendations Oral care BID Other Recommendations Have oral suction available   CHL IP FOLLOW UP RECOMMENDATIONS 11/26/2015 Follow up Recommendations None   CHL IP FREQUENCY AND DURATION 11/26/2015 Speech Therapy Frequency (ACUTE ONLY) min 1 x/week Treatment Duration 1 week      CHL IP ORAL PHASE 11/26/2015 Oral Phase WFL Oral - Pudding Teaspoon -- Oral - Pudding Cup -- Oral - Honey Teaspoon -- Oral - Honey Cup -- Oral - Nectar Teaspoon -- Oral - Nectar Cup WFL Oral - Nectar Straw -- Oral - Thin Teaspoon -- Oral - Thin Cup WFL Oral - Thin Straw WFL Oral - Puree WFL Oral - Mech Soft WFL Oral - Regular -- Oral - Multi-Consistency -- Oral - Pill -- Oral Phase - Comment --  CHL IP PHARYNGEAL PHASE 11/26/2015  Pharyngeal Phase Impaired Pharyngeal- Pudding Teaspoon -- Pharyngeal -- Pharyngeal- Pudding Cup -- Pharyngeal -- Pharyngeal- Honey Teaspoon -- Pharyngeal -- Pharyngeal- Honey Cup -- Pharyngeal -- Pharyngeal- Nectar Teaspoon -- Pharyngeal -- Pharyngeal- Nectar Cup WFL Pharyngeal -- Pharyngeal- Nectar Straw -- Pharyngeal -- Pharyngeal- Thin Teaspoon -- Pharyngeal -- Pharyngeal- Thin Cup WFL Pharyngeal -- Pharyngeal- Thin Straw WFL Pharyngeal -- Pharyngeal- Puree WFL Pharyngeal -- Pharyngeal- Mechanical Soft -- Pharyngeal -- Pharyngeal- Regular WFL Pharyngeal -- Pharyngeal- Multi-consistency -- Pharyngeal -- Pharyngeal- Pill -- Pharyngeal -- Pharyngeal Comment --  CHL IP CERVICAL ESOPHAGEAL PHASE 11/26/2015 Cervical Esophageal Phase Impaired Pudding Teaspoon -- Pudding Cup -- Honey Teaspoon -- Honey Cup -- Nectar Teaspoon -- Nectar Cup Reduced cricopharyngeal relaxation;Esophageal backflow into the pharynx Nectar Straw -- Thin Teaspoon -- Thin Cup Reduced cricopharyngeal relaxation;Esophageal backflow into the pharynx Thin Straw Reduced cricopharyngeal relaxation;Esophageal backflow into the pharynx Puree Reduced cricopharyngeal relaxation Mechanical Soft -- Regular Reduced cricopharyngeal relaxation Multi-consistency -- Pill -- Cervical Esophageal Comment appearance of Zenker's diverticulum resulting in backflow of liquids from cervical esophagus into pharynx WITHOUT pt awareness, cued dry swallows helpful to decrease residuals but were difficult for pt to conduct Luanna Salk, [facility] SLP [phone]              Dg C-arm 1-60 Min-no Report  Result Date: 11/23/2015 CLINICAL DATA: surgery C-ARM 1-60 MINUTES Fluoroscopy was utilized by the requesting physician.  No radiographic interpretation.   Dg Hip Unilat With Pelvis 2-3 Views Left  Result Date: 11/21/2015 CLINICAL DATA:  Fall today out of wheelchair. Left hip pain with external rotation. EXAM: DG HIP (WITH OR WITHOUT PELVIS) 2-3V LEFT COMPARISON:  Pelvis and  right hip radiographs 09/23/2014 FINDINGS: Comminuted angulated intertrochanteric fracture left femur. There is displacement and proximal migration of the femoral shaft. Femoral head remains seated in the acetabulum. Post ORIF right hip intertrochanteric fracture. Probable healed fracture of the right inferior pubic ramus. The bones are under mineralized. IMPRESSION: Comminuted intertrochanteric left hip fracture. Electronically Signed   By: Jeb Levering M.D.   On: 11/21/2015 19:53   Dg Femur Min 2 Views Left  Result Date: 11/23/2015 CLINICAL DATA:  Patient status post ORIF left femur fracture. EXAM: LEFT FEMUR 2 VIEWS; DG C-ARM 1-60 MIN-NO REPORT COMPARISON:  Left hip radiograph 11/21/2015. FINDINGS: 4 intraoperative fluoroscopic images were submitted. Patient status post intra medullary rod and screw fixation of comminuted intertrochanteric left hip fracture. Improved anatomic alignment. Hardware appears intact. IMPRESSION: Patient status post ORIF left femur fracture with intra medullary rod and screw fixation. Electronically Signed   By: Lovey Newcomer M.D.   On:  11/23/2015 20:01    Microbiology: Recent Results (from the past 240 hour(s))  Surgical PCR screen     Status: Abnormal   Collection Time: 11/23/15  4:35 AM  Result Value Ref Range Status   MRSA, PCR POSITIVE (A) NEGATIVE Final    Comment: RESULT CALLED TO, READ BACK BY AND VERIFIED WITH: K. SILK RN AT 0945 ON 07.21.17 BY SHUEA    Staphylococcus aureus POSITIVE (A) NEGATIVE Final    Comment:        The Xpert SA Assay (FDA approved for NASAL specimens in patients over 25 years of age), is one component of a comprehensive surveillance program.  Test performance has been validated by [facility] for patients greater than or equal to 21 year old. It is not intended to diagnose infection nor to guide or monitor treatment.   Culture, Urine     Status: Abnormal   Collection Time: 11/24/15 10:53 AM  Result Value Ref Range Status    Specimen Description URINE, CATHETERIZED  Final   Special Requests NONE  Final   Culture >=100,000 COLONIES/mL KLEBSIELLA PNEUMONIAE (A)  Final   Report Status 11/27/2015 FINAL  Final   Organism ID, Bacteria KLEBSIELLA PNEUMONIAE (A)  Final      Susceptibility   Klebsiella pneumoniae - MIC*    AMPICILLIN 16 RESISTANT Resistant     CEFAZOLIN <=4 SENSITIVE Sensitive     CEFTRIAXONE <=1 SENSITIVE Sensitive     CIPROFLOXACIN <=0.25 SENSITIVE Sensitive     GENTAMICIN <=1 SENSITIVE Sensitive     IMIPENEM 0.5 SENSITIVE Sensitive     NITROFURANTOIN 128 RESISTANT Resistant     TRIMETH/SULFA >=320 RESISTANT Resistant     AMPICILLIN/SULBACTAM 4 SENSITIVE Sensitive     PIP/TAZO <=4 SENSITIVE Sensitive     * >=100,000 COLONIES/mL KLEBSIELLA PNEUMONIAE     Labs: Basic Metabolic Panel:  Recent Labs Lab 11/21/15 1958 11/22/15 0500 11/24/15 0442 11/25/15 0431 11/26/15 0410  NA 139 138 138 139 141  K 3.8 4.2 3.6 3.3* 3.3*  CL 107 107 105 106 109  CO2 26 26 29 29 26   GLUCOSE 141* 154* 122* 101* 88  BUN 20 16 10 15 18   CREATININE 0.77 0.62 0.48 0.58 0.45  CALCIUM 9.8 9.4 8.7* 8.4* 8.9  PHOS  --   --   --   --  2.2*   Liver Function Tests:  Recent Labs Lab 11/22/15 0500 11/26/15 0410  AST 20  --   ALT 16  --   ALKPHOS 63  --   BILITOT 0.8  --   PROT 5.8*  --   ALBUMIN 3.2* 2.5*   No results for input(s): LIPASE, AMYLASE in the last 168 hours. No results for input(s): AMMONIA in the last 168 hours. CBC:  Recent Labs Lab 11/21/15 1958 11/22/15 0500 11/23/15 0426 11/24/15 0442 11/25/15 0431 11/26/15 0410  WBC 13.0* 9.0 9.4 10.5 8.3 7.8  NEUTROABS 10.9*  --   --   --   --  5.7  HGB 12.3 10.2* 7.8* 9.8* 7.5* 8.3*  HCT 37.3 31.0* 23.9* 29.2* 23.0* 24.6*  MCV 93.3 94.2 95.2 91.0 93.9 91.4  PLT 227 190 142* 136* 142* 176   Cardiac Enzymes: No results for input(s): CKTOTAL, CKMB, CKMBINDEX, TROPONINI in the last 168 hours. BNP: BNP (last 3 results) No results for  input(s): BNP in the last 8760 hours.  ProBNP (last 3 results) No results for input(s): PROBNP in the last 8760 hours.  CBG: No results for input(s):  GLUCAP in the last 168 hours.     Signed:  Dana Allan, MD  [facility] Pager #: [phone] 7PM-7AM contact night coverage as above

## 2016-04-28 ENCOUNTER — Emergency Department (HOSPITAL_COMMUNITY): Payer: Medicare Other

## 2016-04-28 ENCOUNTER — Inpatient Hospital Stay (HOSPITAL_COMMUNITY)
Admission: EM | Admit: 2016-04-28 | Discharge: 2016-05-03 | DRG: 177 | Disposition: A | Discharge status: Hospice | Payer: Medicare Other | Attending: Internal Medicine | Admitting: Internal Medicine

## 2016-04-28 ENCOUNTER — Encounter (HOSPITAL_COMMUNITY): Payer: Self-pay

## 2016-04-28 DIAGNOSIS — K225 Diverticulum of esophagus, acquired: Secondary | ICD-10-CM | POA: Diagnosis present

## 2016-04-28 DIAGNOSIS — K219 Gastro-esophageal reflux disease without esophagitis: Secondary | ICD-10-CM | POA: Diagnosis present

## 2016-04-28 DIAGNOSIS — Z7901 Long term (current) use of anticoagulants: Secondary | ICD-10-CM

## 2016-04-28 DIAGNOSIS — R1311 Dysphagia, oral phase: Secondary | ICD-10-CM | POA: Diagnosis not present

## 2016-04-28 DIAGNOSIS — E1165 Type 2 diabetes mellitus with hyperglycemia: Secondary | ICD-10-CM | POA: Diagnosis present

## 2016-04-28 DIAGNOSIS — T501X5A Adverse effect of loop [high-ceiling] diuretics, initial encounter: Secondary | ICD-10-CM | POA: Diagnosis present

## 2016-04-28 DIAGNOSIS — E870 Hyperosmolality and hypernatremia: Secondary | ICD-10-CM | POA: Diagnosis not present

## 2016-04-28 DIAGNOSIS — J9601 Acute respiratory failure with hypoxia: Secondary | ICD-10-CM | POA: Diagnosis present

## 2016-04-28 DIAGNOSIS — R0600 Dyspnea, unspecified: Secondary | ICD-10-CM | POA: Diagnosis not present

## 2016-04-28 DIAGNOSIS — I5032 Chronic diastolic (congestive) heart failure: Secondary | ICD-10-CM | POA: Diagnosis present

## 2016-04-28 DIAGNOSIS — I480 Paroxysmal atrial fibrillation: Secondary | ICD-10-CM | POA: Diagnosis not present

## 2016-04-28 DIAGNOSIS — J189 Pneumonia, unspecified organism: Secondary | ICD-10-CM

## 2016-04-28 DIAGNOSIS — F0390 Unspecified dementia without behavioral disturbance: Secondary | ICD-10-CM | POA: Diagnosis not present

## 2016-04-28 DIAGNOSIS — Z681 Body mass index (BMI) 19 or less, adult: Secondary | ICD-10-CM

## 2016-04-28 DIAGNOSIS — F419 Anxiety disorder, unspecified: Secondary | ICD-10-CM | POA: Diagnosis present

## 2016-04-28 DIAGNOSIS — R1310 Dysphagia, unspecified: Secondary | ICD-10-CM

## 2016-04-28 DIAGNOSIS — E43 Unspecified severe protein-calorie malnutrition: Secondary | ICD-10-CM | POA: Diagnosis present

## 2016-04-28 DIAGNOSIS — Z923 Personal history of irradiation: Secondary | ICD-10-CM | POA: Diagnosis not present

## 2016-04-28 DIAGNOSIS — Z880 Allergy status to penicillin: Secondary | ICD-10-CM

## 2016-04-28 DIAGNOSIS — I482 Chronic atrial fibrillation: Secondary | ICD-10-CM | POA: Diagnosis present

## 2016-04-28 DIAGNOSIS — Z66 Do not resuscitate: Secondary | ICD-10-CM | POA: Diagnosis present

## 2016-04-28 DIAGNOSIS — Z79899 Other long term (current) drug therapy: Secondary | ICD-10-CM

## 2016-04-28 DIAGNOSIS — E039 Hypothyroidism, unspecified: Secondary | ICD-10-CM | POA: Diagnosis present

## 2016-04-28 DIAGNOSIS — Z7189 Other specified counseling: Secondary | ICD-10-CM

## 2016-04-28 DIAGNOSIS — E785 Hyperlipidemia, unspecified: Secondary | ICD-10-CM | POA: Diagnosis present

## 2016-04-28 DIAGNOSIS — Z515 Encounter for palliative care: Secondary | ICD-10-CM | POA: Diagnosis not present

## 2016-04-28 DIAGNOSIS — Z8249 Family history of ischemic heart disease and other diseases of the circulatory system: Secondary | ICD-10-CM

## 2016-04-28 DIAGNOSIS — Z882 Allergy status to sulfonamides status: Secondary | ICD-10-CM

## 2016-04-28 DIAGNOSIS — Z87891 Personal history of nicotine dependence: Secondary | ICD-10-CM | POA: Diagnosis not present

## 2016-04-28 DIAGNOSIS — I4891 Unspecified atrial fibrillation: Secondary | ICD-10-CM

## 2016-04-28 DIAGNOSIS — J690 Pneumonitis due to inhalation of food and vomit: Secondary | ICD-10-CM | POA: Diagnosis present

## 2016-04-28 DIAGNOSIS — R0602 Shortness of breath: Secondary | ICD-10-CM | POA: Diagnosis present

## 2016-04-28 DIAGNOSIS — E876 Hypokalemia: Secondary | ICD-10-CM | POA: Diagnosis not present

## 2016-04-28 DIAGNOSIS — R0603 Acute respiratory distress: Secondary | ICD-10-CM

## 2016-04-28 DIAGNOSIS — G309 Alzheimer's disease, unspecified: Secondary | ICD-10-CM | POA: Diagnosis present

## 2016-04-28 DIAGNOSIS — I110 Hypertensive heart disease with heart failure: Secondary | ICD-10-CM | POA: Diagnosis present

## 2016-04-28 DIAGNOSIS — F0280 Dementia in other diseases classified elsewhere without behavioral disturbance: Secondary | ICD-10-CM | POA: Diagnosis present

## 2016-04-28 DIAGNOSIS — Z881 Allergy status to other antibiotic agents status: Secondary | ICD-10-CM

## 2016-04-28 DIAGNOSIS — J181 Lobar pneumonia, unspecified organism: Secondary | ICD-10-CM

## 2016-04-28 LAB — COMPREHENSIVE METABOLIC PANEL
ALBUMIN: 3.5 g/dL (ref 3.5–5.0)
ALT: 17 U/L (ref 14–54)
ANION GAP: 13 (ref 5–15)
AST: 23 U/L (ref 15–41)
Alkaline Phosphatase: 78 U/L (ref 38–126)
BUN: 14 mg/dL (ref 6–20)
CO2: 28 mmol/L (ref 22–32)
Calcium: 10.7 mg/dL — ABNORMAL HIGH (ref 8.9–10.3)
Chloride: 101 mmol/L (ref 101–111)
Creatinine, Ser: 0.94 mg/dL (ref 0.44–1.00)
GFR calc Af Amer: >60 mL/min (ref >60)
GFR calc non Af Amer: 52 mL/min — ABNORMAL LOW (ref >60)
GLUCOSE: 206 mg/dL — AB (ref 65–99)
POTASSIUM: 2.9 mmol/L — AB (ref 3.5–5.1)
SODIUM: 142 mmol/L (ref 135–145)
TOTAL PROTEIN: 7.1 g/dL (ref 6.5–8.1)
Total Bilirubin: 0.8 mg/dL (ref 0.3–1.2)

## 2016-04-28 LAB — CBC WITH DIFFERENTIAL/PLATELET
BASOS PCT: 0 %
Basophils Absolute: 0 10*3/uL (ref 0.0–0.1)
EOS ABS: 0 10*3/uL (ref 0.0–0.7)
EOS PCT: 0 %
HCT: 41.4 % (ref 36.0–46.0)
Hemoglobin: 14 g/dL (ref 12.0–15.0)
Lymphocytes Relative: 5 %
Lymphs Abs: 0.8 10*3/uL (ref 0.7–4.0)
MCH: 31.8 pg (ref 26.0–34.0)
MCHC: 33.8 g/dL (ref 30.0–36.0)
MCV: 94.1 fL (ref 78.0–100.0)
MONO ABS: 1.1 10*3/uL — AB (ref 0.1–1.0)
MONOS PCT: 7 %
Neutro Abs: 14.2 10*3/uL — ABNORMAL HIGH (ref 1.7–7.7)
Neutrophils Relative %: 88 %
Platelets: 307 10*3/uL (ref 150–400)
RBC: 4.4 MIL/uL (ref 3.87–5.11)
RDW: 14.6 % (ref 11.5–15.5)
WBC: 16 10*3/uL — ABNORMAL HIGH (ref 4.0–10.5)

## 2016-04-28 LAB — BRAIN NATRIURETIC PEPTIDE: B Natriuretic Peptide: 42.5 pg/mL (ref 0.0–100.0)

## 2016-04-28 MED ORDER — DEXTROSE 5 % IV SOLN
500.0000 mg | Freq: Once | INTRAVENOUS | Status: AC
  Administered 2016-04-28: 500 mg via INTRAVENOUS
  Filled 2016-04-28: qty 500

## 2016-04-28 MED ORDER — VANCOMYCIN HCL IN DEXTROSE 1-5 GM/200ML-% IV SOLN
1000.0000 mg | Freq: Once | INTRAVENOUS | Status: AC
  Administered 2016-04-28: 1000 mg via INTRAVENOUS
  Filled 2016-04-28: qty 200

## 2016-04-28 MED ORDER — CEFTRIAXONE SODIUM 1 G IJ SOLR
1.0000 g | Freq: Once | INTRAMUSCULAR | Status: AC
  Administered 2016-04-28: 1 g via INTRAVENOUS
  Filled 2016-04-28: qty 10

## 2016-04-28 NOTE — ED Provider Notes (Signed)
[place] DEPT Provider Note   CSN: [id number] Arrival date & time: 04/28/16  1957     History   Chief Complaint Chief Complaint  Patient presents with  . Respiratory Distress    HPI Shannon [name] is a 82 y.o. female.  The history is provided by the patient, the EMS personnel and medical records. The history is limited by the condition of the patient. No language interpreter was used.  Shortness of Breath  This is a new problem. The current episode started 1 to 2 hours ago. The problem has not changed since onset.Associated symptoms include cough and wheezing. It is unknown what precipitated the problem. She has had prior hospitalizations. She has had prior ED visits. Associated medical issues include heart failure (EMS reports that patient has history of CHF vs "fluid overload"). Associated medical issues do not include asthma, COPD, PE or CAD.    Past Medical History:  Diagnosis Date  . A-fib ([place])   . Cancer ([place])    left breast  . Constipation   . Dementia   . GERD (gastroesophageal reflux disease)   . GERD (gastroesophageal reflux disease)   . HLD (hyperlipidemia)   . Hx of radiation therapy 2001   left breast  . Hypertension   . Hypothyroidism     Patient Active Problem List   Diagnosis Date Noted  . Pneumonia 04/28/2016  . Pressure ulcer 11/28/2015  . At high risk for aspiration   . Hip fracture ([place]) 11/21/2015  . Dementia 11/21/2015  . Sepsis ([place]) 09/19/2015  . Hypokalemia 09/19/2015  . UTI (lower urinary tract infection) 09/19/2015  . Acute encephalopathy 09/19/2015  . HLD (hyperlipidemia)   . GERD (gastroesophageal reflux disease)   . Gastroesophageal reflux disease without esophagitis   . Closed right hip fracture ([place]) 09/24/2014  . Fracture, intertrochanteric, right femur ([place]) 09/23/2014  . Long term (current) use of anticoagulants 11/30/2012  . Cancer ([place])   . Cancer of breast, intraductal 12/01/2011  . UTI (urinary tract infection)  07/04/2011  . SDH (subdural hematoma) ([place]) 06/30/2011  . Occipital fracture ([place]) 06/30/2011  . Fall 06/30/2011  . A-fib ([place]) 06/30/2011  . Warfarin-induced coagulopathy ([place]) 06/30/2011  . Hypothyroidism 06/30/2011    Past Surgical History:  Procedure Laterality Date  . BREAST SURGERY    . FEMUR IM NAIL Left 11/23/2015   Procedure: INTRAMEDULLARY (IM) NAIL INTERTROCHANTRIC LEFT HIP;  Surgeon: Meredith Pel, MD;  Location: [facility] ORS;  Service: Orthopedics;  Laterality: Left;  . INTRAMEDULLARY (IM) NAIL INTERTROCHANTERIC Right 09/24/2014   Procedure: INTRAMEDULLARY (IM) NAIL INTERTROCHANTRIC;  Surgeon: Meredith Pel, MD;  Location: [facility];  Service: Orthopedics;  Laterality: Right;    OB History    No data available       Home Medications    Prior to Admission medications   Medication Sig Start Date End Date Taking? Authorizing Provider  atorvastatin (LIPITOR) 10 MG tablet Take 10 mg by mouth at bedtime.     Historical Provider, MD  Cholecalciferol (VITAMIN D) 2000 UNITS tablet Take 2,000 Units by mouth daily.    Historical Provider, MD  [employer] STOOL SOFTENER 100 MG capsule Take 1 capsule by mouth 2 (two) times daily as needed for moderate constipation.  06/28/14   Historical Provider, MD  diltiazem (CARDIZEM CD) 180 MG 24 hr capsule Take 180 mg by mouth daily.  11/10/12   Historical Provider, MD  feeding supplement, ENSURE ENLIVE, (ENSURE ENLIVE) LIQD Take 237 mLs by mouth 2 (two) times  daily between meals. 11/28/15   Bonnell Public, MD  HYDROcodone-acetaminophen (NORCO/VICODIN) 5-325 MG tablet Take 1-2 tablets by mouth every 6 (six) hours as needed for moderate pain. 11/25/15   Meredith Pel, MD  levothyroxine (SYNTHROID, LEVOTHROID) 112 MCG tablet Take 112 mcg by mouth daily before breakfast.    Historical Provider, MD  MYRBETRIQ 50 MG TB24 Take 50 mg by mouth daily.  11/10/12   Historical Provider, MD  pantoprazole (PROTONIX) 40 MG tablet Take 40 mg by mouth daily.     Historical Provider, MD  polyethylene glycol (MIRALAX / GLYCOLAX) packet Take 17 g by mouth daily. 09/26/14   Shanker Kristeen Mans, MD  propafenone (RYTHMOL) 150 MG tablet Take 150 mg by mouth 3 (three) times daily.    Historical Provider, MD  sertraline (ZOLOFT) 25 MG tablet Take 25 mg by mouth daily.  09/17/15   Historical Provider, MD  warfarin (COUMADIN) 2.5 MG tablet Take 2.5 mg by mouth 4 (four) times a week. Tues, Thursday, Sat, and Sun    Historical Provider, MD  warfarin (COUMADIN) 5 MG tablet Take 5 mg by mouth as directed. Take 1 tablet(5 mg) on Mon, Wed, Fri 10/23/12   Historical Provider, MD    Family History No family history on file.  Social History Social History  Substance Use Topics  . Smoking status: Former Smoker    Quit date: 05/05/1968  . Smokeless tobacco: Never Used  . Alcohol use No     Allergies   Demerol; Keflet [cephalexin]; Sulfa antibiotics; Tequin; and Penicillins   Review of Systems Review of Systems  Unable to perform ROS: Acuity of condition  Respiratory: Positive for cough, shortness of breath and wheezing.      Physical Exam Updated Vital Signs BP 142/98   Pulse 99   Temp 100.1 F (37.8 C) (Rectal)   Resp 25   Ht 5\' 5"  ([id number] m)   Wt 70.3 kg   SpO2 100%   BMI 25.79 kg/m   Physical Exam  Constitutional: She appears distressed.  HENT:  Head: Normocephalic and atraumatic.  Eyes: Conjunctivae and EOM are normal.  Neck: Normal range of motion. Neck supple.  Pulmonary/Chest: She is in respiratory distress. She has rales (worse on R than L).  On bipap  Abdominal: Soft. She exhibits no distension. There is no tenderness.  Musculoskeletal: She exhibits edema (mild pitting bilaterally up the leg).  Neurological: She is alert.  Skin: Skin is warm and dry. She is not diaphoretic.  Nursing note and vitals reviewed.    ED Treatments / Results  Labs (all labs ordered are listed, but only abnormal results are displayed) Labs Reviewed  CBC  WITH DIFFERENTIAL/PLATELET - Abnormal; Notable for the following:       Result Value   WBC 16.0 (*)    Neutro Abs 14.2 (*)    Monocytes Absolute 1.1 (*)    All other components within normal limits  COMPREHENSIVE METABOLIC PANEL - Abnormal; Notable for the following:    Potassium 2.9 (*)    Glucose, Bld 206 (*)    Calcium 10.7 (*)    GFR calc non Af Amer 52 (*)    All other components within normal limits  BRAIN NATRIURETIC PEPTIDE    EKG  EKG Interpretation None       Radiology Dg Chest Portable 1 View  Result Date: 04/28/2016 CLINICAL DATA:  Respiratory distress EXAM: PORTABLE CHEST 1 VIEW COMPARISON:  11/24/2015 FINDINGS: Cardiac shadow is within normal limits. Aortic  calcifications are again seen. The lungs are well aerated bilaterally. Patchy changes are noted in the right lung base. No sizable effusion is seen. No bony abnormality is noted. IMPRESSION: Patchy infiltrate in the right lung base. Electronically Signed   By: Inez Catalina M.D.   On: 04/28/2016 21:12    Procedures Procedures (including critical care time)  Medications Ordered in ED Medications  azithromycin (ZITHROMAX) 500 mg in dextrose 5 % 250 mL IVPB (500 mg Intravenous New Bag/Given 04/28/16 2326)  vancomycin (VANCOCIN) IVPB 1000 mg/200 mL premix (1,000 mg Intravenous New Bag/Given 04/28/16 2237)  cefTRIAXone (ROCEPHIN) 1 g in dextrose 5 % 50 mL IVPB (0 g Intravenous Stopped 04/28/16 2319)     Initial Impression / Assessment and Plan / ED Course  I have reviewed the triage vital signs and the nursing notes.  Pertinent labs & imaging results that were available during my care of the patient were reviewed by me and considered in my medical decision making (see chart for details).  Clinical Course     Patient is an 82 year old female who presents today with acute onset shortness of breath that occurred earlier today. Patient is hypoxic on arrival and on CPAP. She was quickly changed over to BiPAP with  our respiratory therapist at bedside. Felt that I heard more rales present on the right side than the left side. She also felt warm and did have a temperature of 100.1 rectally. Concern is for superimposed infection worsening likely chronic respiratory disease. Chest x-ray does show right lower lung changes which seem consistent with pneumonia in this setting. Antibiotics were given. Given her need for BiPAP and discussed with the hospitalist who will admit the patient to [facility]. I have discussed with the family members at bedside who are aware of the plan for admission. Patient was in fair condition at the time of [facility] to the [facility].  Final Clinical Impressions(s) / ED Diagnoses   Final diagnoses:  Pneumonia of right lower lobe due to infectious organism [facility])    New Prescriptions New Prescriptions   No medications on file     Theodosia Quay, MD 04/28/16 [place]    Elnora Morrison, MD 04/28/16 2344

## 2016-04-28 NOTE — ED Triage Notes (Signed)
Pt comes via [facility] from [facility] with resp distress, flash pulmonary edema, rales in all lobes, on CPAP,  PTA received one nitro, initial pressure 170/100

## 2016-04-29 ENCOUNTER — Inpatient Hospital Stay (HOSPITAL_COMMUNITY): Payer: Medicare Other

## 2016-04-29 ENCOUNTER — Encounter (HOSPITAL_COMMUNITY): Payer: Self-pay | Admitting: Internal Medicine

## 2016-04-29 DIAGNOSIS — R0603 Acute respiratory distress: Secondary | ICD-10-CM | POA: Diagnosis present

## 2016-04-29 DIAGNOSIS — E039 Hypothyroidism, unspecified: Secondary | ICD-10-CM

## 2016-04-29 DIAGNOSIS — J9601 Acute respiratory failure with hypoxia: Secondary | ICD-10-CM | POA: Diagnosis present

## 2016-04-29 LAB — MAGNESIUM: Magnesium: 1.7 mg/dL (ref 1.7–2.4)

## 2016-04-29 LAB — GLUCOSE, CAPILLARY
GLUCOSE-CAPILLARY: 224 mg/dL — AB (ref 65–99)
GLUCOSE-CAPILLARY: 108 mg/dL — AB (ref 65–99)
Glucose-Capillary: 156 mg/dL — ABNORMAL HIGH (ref 65–99)
Glucose-Capillary: 138 mg/dL — ABNORMAL HIGH (ref 65–99)

## 2016-04-29 LAB — TSH: TSH: 5.094 u[IU]/mL — ABNORMAL HIGH (ref 0.350–4.500)

## 2016-04-29 LAB — URINALYSIS, ROUTINE W REFLEX MICROSCOPIC
BILIRUBIN URINE: NEGATIVE
GLUCOSE, UA: NEGATIVE mg/dL
KETONES UR: NEGATIVE mg/dL
LEUKOCYTES UA: NEGATIVE
NITRITE: NEGATIVE
PROTEIN: 30 mg/dL — AB
Specific Gravity, Urine: 1.015 (ref 1.005–1.030)
pH: 6 (ref 5.0–8.0)

## 2016-04-29 LAB — BASIC METABOLIC PANEL
ANION GAP: 14 (ref 5–15)
BUN: 15 mg/dL (ref 6–20)
CALCIUM: 10.6 mg/dL — AB (ref 8.9–10.3)
CHLORIDE: 100 mmol/L — AB (ref 101–111)
CO2: 27 mmol/L (ref 22–32)
CREATININE: 0.95 mg/dL (ref 0.44–1.00)
GFR calc Af Amer: 60 mL/min — ABNORMAL LOW (ref >60)
GFR calc non Af Amer: 52 mL/min — ABNORMAL LOW (ref >60)
GLUCOSE: 200 mg/dL — AB (ref 65–99)
Potassium: 2.7 mmol/L — CL (ref 3.5–5.1)
Sodium: 141 mmol/L (ref 135–145)

## 2016-04-29 LAB — CBC
HCT: 38.9 % (ref 36.0–46.0)
HEMOGLOBIN: 13.2 g/dL (ref 12.0–15.0)
MCH: 31.7 pg (ref 26.0–34.0)
MCHC: 33.9 g/dL (ref 30.0–36.0)
MCV: 93.3 fL (ref 78.0–100.0)
Platelets: 280 10*3/uL (ref 150–400)
RBC: 4.17 MIL/uL (ref 3.87–5.11)
RDW: 14.5 % (ref 11.5–15.5)
WBC: 12.9 10*3/uL — ABNORMAL HIGH (ref 4.0–10.5)

## 2016-04-29 LAB — HEMOGLOBIN A1C
HEMOGLOBIN A1C: 5.6 % (ref 4.8–5.6)
Mean Plasma Glucose: 114 mg/dL

## 2016-04-29 LAB — INFLUENZA PANEL BY PCR (TYPE A & B)
INFLBPCR: NEGATIVE
Influenza A By PCR: NEGATIVE

## 2016-04-29 LAB — EXPECTORATED SPUTUM ASSESSMENT W GRAM STAIN, RFLX TO RESP C

## 2016-04-29 LAB — EXPECTORATED SPUTUM ASSESSMENT W REFEX TO RESP CULTURE

## 2016-04-29 LAB — MRSA PCR SCREENING: MRSA by PCR: NEGATIVE

## 2016-04-29 LAB — TROPONIN I: Troponin I: <0.03 ng/mL (ref <0.03)

## 2016-04-29 MED ORDER — PANTOPRAZOLE SODIUM 40 MG PO TBEC
40.0000 mg | DELAYED_RELEASE_TABLET | Freq: Every day | ORAL | Status: DC
  Administered 2016-04-29: 40 mg via ORAL
  Filled 2016-04-29: qty 1

## 2016-04-29 MED ORDER — ATORVASTATIN CALCIUM 10 MG PO TABS: 10.0000 mg | ORAL_TABLET | Freq: Every day | ORAL | Status: DC

## 2016-04-29 MED ORDER — DEXTROSE 5 % IV SOLN
1.0000 g | Freq: Two times a day (BID) | INTRAVENOUS | Status: DC
  Administered 2016-04-29 (×2): 1 g via INTRAVENOUS
  Filled 2016-04-29 (×4): qty 1

## 2016-04-29 MED ORDER — POLYETHYLENE GLYCOL 3350 17 G PO PACK
17.0000 g | PACK | Freq: Every day | ORAL | Status: DC
  Administered 2016-04-29: 17 g via ORAL
  Filled 2016-04-29: qty 1

## 2016-04-29 MED ORDER — LEVOTHYROXINE SODIUM 137 MCG PO TABS
137.0000 ug | ORAL_TABLET | Freq: Every day | ORAL | Status: DC
  Administered 2016-04-29: 137 ug via ORAL
  Filled 2016-04-29 (×2): qty 1

## 2016-04-29 MED ORDER — ACETAMINOPHEN 650 MG RE SUPP: 650.0000 mg | Freq: Four times a day (QID) | RECTAL | Status: DC | PRN

## 2016-04-29 MED ORDER — HYDROCODONE-ACETAMINOPHEN 5-325 MG PO TABS: 1.0000 | ORAL_TABLET | Freq: Four times a day (QID) | ORAL | Status: DC | PRN

## 2016-04-29 MED ORDER — ACETAMINOPHEN 325 MG PO TABS: 650.0000 mg | ORAL_TABLET | Freq: Four times a day (QID) | ORAL | Status: DC | PRN

## 2016-04-29 MED ORDER — LEVOTHYROXINE SODIUM 112 MCG PO TABS: 112.0000 ug | ORAL_TABLET | Freq: Every day | ORAL | Status: DC

## 2016-04-29 MED ORDER — SODIUM CHLORIDE 0.9 % IV SOLN
30.0000 meq | Freq: Once | INTRAVENOUS | Status: AC
  Administered 2016-04-29: 30 meq via INTRAVENOUS
  Filled 2016-04-29: qty 15

## 2016-04-29 MED ORDER — SERTRALINE HCL 50 MG PO TABS
25.0000 mg | ORAL_TABLET | Freq: Every day | ORAL | Status: DC
  Administered 2016-04-29: 25 mg via ORAL
  Filled 2016-04-29: qty 1

## 2016-04-29 MED ORDER — POTASSIUM CHLORIDE 20 MEQ/15ML (10%) PO SOLN
40.0000 meq | Freq: Once | ORAL | Status: AC
  Administered 2016-04-29: 40 meq via ORAL
  Filled 2016-04-29: qty 30

## 2016-04-29 MED ORDER — DEXTROSE 5 % IV SOLN
500.0000 mg | INTRAVENOUS | Status: DC
  Administered 2016-04-29: 500 mg via INTRAVENOUS
  Filled 2016-04-29: qty 500

## 2016-04-29 MED ORDER — INSULIN ASPART 100 UNIT/ML ~~LOC~~ SOLN
0.0000 [IU] | Freq: Three times a day (TID) | SUBCUTANEOUS | Status: DC
Start: 2016-04-29 — End: 2016-05-02
  Administered 2016-04-29: 2 [IU] via SUBCUTANEOUS
  Administered 2016-04-29: 3 [IU] via SUBCUTANEOUS
  Administered 2016-04-30: 1 [IU] via SUBCUTANEOUS

## 2016-04-29 MED ORDER — ENSURE ENLIVE PO LIQD
237.0000 mL | Freq: Two times a day (BID) | ORAL | Status: DC
  Administered 2016-04-29: 237 mL via ORAL

## 2016-04-29 MED ORDER — APIXABAN 2.5 MG PO TABS
2.5000 mg | ORAL_TABLET | Freq: Two times a day (BID) | ORAL | Status: DC
  Administered 2016-04-29: 2.5 mg via ORAL
  Filled 2016-04-29 (×2): qty 1

## 2016-04-29 MED ORDER — VANCOMYCIN HCL IN DEXTROSE 750-5 MG/150ML-% IV SOLN
750.0000 mg | INTRAVENOUS | Status: DC
  Administered 2016-04-30: 750 mg via INTRAVENOUS
  Filled 2016-04-29: qty 150

## 2016-04-29 MED ORDER — MIRABEGRON ER 50 MG PO TB24
50.0000 mg | ORAL_TABLET | Freq: Every day | ORAL | Status: DC
  Administered 2016-04-29: 50 mg via ORAL
  Filled 2016-04-29 (×2): qty 1

## 2016-04-29 MED ORDER — METOPROLOL TARTRATE 5 MG/5ML IV SOLN
5.0000 mg | Freq: Once | INTRAVENOUS | Status: AC
  Administered 2016-04-29: 5 mg via INTRAVENOUS
  Filled 2016-04-29: qty 5

## 2016-04-29 MED ORDER — VITAMIN D 1000 UNITS PO TABS
2000.0000 [IU] | ORAL_TABLET | Freq: Every day | ORAL | Status: DC
  Administered 2016-04-29: 2000 [IU] via ORAL
  Filled 2016-04-29: qty 2

## 2016-04-29 MED ORDER — ONDANSETRON HCL 4 MG PO TABS: 4.0000 mg | ORAL_TABLET | Freq: Four times a day (QID) | ORAL | Status: DC | PRN

## 2016-04-29 MED ORDER — FUROSEMIDE 10 MG/ML IJ SOLN
40.0000 mg | Freq: Once | INTRAMUSCULAR | Status: AC
  Administered 2016-04-29: 40 mg via INTRAVENOUS
  Filled 2016-04-29: qty 4

## 2016-04-29 MED ORDER — DILTIAZEM HCL ER COATED BEADS 180 MG PO CP24
180.0000 mg | ORAL_CAPSULE | Freq: Every day | ORAL | Status: DC
  Administered 2016-04-29: 180 mg via ORAL
  Filled 2016-04-29: qty 1

## 2016-04-29 MED ORDER — POTASSIUM CHLORIDE 2 MEQ/ML IV SOLN
30.0000 meq | Freq: Once | INTRAVENOUS | Status: AC
  Administered 2016-04-29: 30 meq via INTRAVENOUS
  Filled 2016-04-29: qty 15

## 2016-04-29 MED ORDER — PROPAFENONE HCL 150 MG PO TABS
150.0000 mg | ORAL_TABLET | Freq: Three times a day (TID) | ORAL | Status: DC
  Administered 2016-04-29: 150 mg via ORAL
  Filled 2016-04-29 (×6): qty 1

## 2016-04-29 MED ORDER — ONDANSETRON HCL 4 MG/2ML IJ SOLN: 4.0000 mg | Freq: Four times a day (QID) | INTRAMUSCULAR | Status: DC | PRN

## 2016-04-29 NOTE — Progress Notes (Signed)
Mask secure. No breakdown noted at this time

## 2016-04-29 NOTE — H&P (Addendum)
History and Physical    Shannon [name] [id number] DOB: 08/11/1924 DOA: 04/28/2016  PCP: [name], MD  Patient coming from: Assisted living.  Chief Complaint: Shortness of breath.  HPI: Shannon [name] is a 82 y.o. female with history of atrial fibrillation, diastolic CHF, hypertension, hypothyroidism was brought to the ER after patient was having progressively worsening shortness of breath last 2 days. As per patient's daughter-in-law who provided the history patient was given Lasix at least 3-4 doses over the last 24 hours despite which patient's shortness of breath was worsening. Since it acuity worsened yesterday evening patient was brought to the ER. Patient was placed on BiPAP. Chest x-ray was showing infiltrates and lab work showed leukocytosis and patient was febrile. Cultures were obtained and started on antibiotics for pneumonia. On my exam patient is alert awake and oriented to name and place. Follows commands and moves all extremities. Mildly short of breath while on BiPAP.  ED Course: Placed on BiPAP. Antibiotics for pneumonia. Labs revealed hypokalemia and leukocytosis.  Review of Systems: As per HPI, rest all negative.   Past Medical History:  Diagnosis Date  . A-fib ([place])   . Cancer ([place])    left breast  . Constipation   . Dementia   . GERD (gastroesophageal reflux disease)   . GERD (gastroesophageal reflux disease)   . HLD (hyperlipidemia)   . Hx of radiation therapy 2001   left breast  . Hypertension   . Hypothyroidism     Past Surgical History:  Procedure Laterality Date  . BREAST SURGERY    . FEMUR IM NAIL Left 11/23/2015   Procedure: INTRAMEDULLARY (IM) NAIL INTERTROCHANTRIC LEFT HIP;  Surgeon: Meredith Pel, MD;  Location: [facility]S;  Service: Orthopedics;  Laterality: Left;  . INTRAMEDULLARY (IM) NAIL INTERTROCHANTERIC Right 09/24/2014   Procedure: INTRAMEDULLARY (IM) NAIL INTERTROCHANTRIC;  Surgeon: Meredith Pel, MD;  Location: [facility]  ORS;  Service: Orthopedics;  Laterality: Right;     reports that she quit smoking about 48 years ago. She has never used smokeless tobacco. She reports that she does not drink alcohol or use drugs.  Allergies  Allergen Reactions  . Demerol Nausea And Vomiting  . Keflet [Cephalexin] Nausea And Vomiting  . Sulfa Antibiotics Nausea And Vomiting  . Tequin Nausea Only  . Penicillins Rash    Family History  Problem Relation Age of Onset  . Hypertension Other     Prior to Admission medications   Medication Sig Start Date End Date Taking? Authorizing Provider  atorvastatin (LIPITOR) 10 MG tablet Take 10 mg by mouth at bedtime.     Historical Provider, MD  Cholecalciferol (VITAMIN D) 2000 UNITS tablet Take 2,000 Units by mouth daily.    Historical Provider, MD  [employer]S STOOL SOFTENER 100 MG capsule Take 1 capsule by mouth 2 (two) times daily as needed for moderate constipation.  06/28/14   Historical Provider, MD  diltiazem (CARDIZEM CD) 180 MG 24 hr capsule Take 180 mg by mouth daily.  11/10/12   Historical Provider, MD  feeding supplement, ENSURE ENLIVE, (ENSURE ENLIVE) LIQD Take 237 mLs by mouth 2 (two) times daily between meals. 11/28/15   Bonnell Public, MD  HYDROcodone-acetaminophen (NORCO/VICODIN) 5-325 MG tablet Take 1-2 tablets by mouth every 6 (six) hours as needed for moderate pain. 11/25/15   Meredith Pel, MD  levothyroxine (SYNTHROID, LEVOTHROID) 112 MCG tablet Take 112 mcg by mouth daily before breakfast.    Historical Provider, MD  MYRBETRIQ 50 MG  TB24 Take 50 mg by mouth daily.  11/10/12   Historical Provider, MD  pantoprazole (PROTONIX) 40 MG tablet Take 40 mg by mouth daily.    Historical Provider, MD  polyethylene glycol (MIRALAX / GLYCOLAX) packet Take 17 g by mouth daily. 09/26/14   Shanker Kristeen Mans, MD  propafenone (RYTHMOL) 150 MG tablet Take 150 mg by mouth 3 (three) times daily.    Historical Provider, MD  sertraline (ZOLOFT) 25 MG tablet Take 25 mg by mouth daily.   09/17/15   Historical Provider, MD  warfarin (COUMADIN) 2.5 MG tablet Take 2.5 mg by mouth 4 (four) times a week. Tues, Thursday, Sat, and Sun    Historical Provider, MD  warfarin (COUMADIN) 5 MG tablet Take 5 mg by mouth as directed. Take 1 tablet(5 mg) on Mon, Wed, Fri 10/23/12   Historical Provider, MD    Physical Exam: Vitals:   04/28/16 2300 04/28/16 2315 04/28/16 2330 04/28/16 2342  BP: 139/95 142/98 143/78 143/78  Pulse: 101 99 100 102  Resp: 19 25 17 22   Temp:      TempSrc:      SpO2: 98% 100% 99% 100%  Weight:      Height:          Constitutional: Moderately built and nourished. Vitals:   04/28/16 2300 04/28/16 2315 04/28/16 2330 04/28/16 2342  BP: 139/95 142/98 143/78 143/78  Pulse: 101 99 100 102  Resp: 19 25 17 22   Temp:      TempSrc:      SpO2: 98% 100% 99% 100%  Weight:      Height:       Eyes: Anicteric no pallor. ENMT: No discharge from the ears eyes nose or mouth. Neck: No mass felt. No JVD appreciated. Respiratory: No rhonchi or crepitations. Cardiovascular: S1-S2 heard no murmurs appreciated. Abdomen: Soft nontender bowel sounds present. No guarding or rigidity. Musculoskeletal: No edema. No joint effusion. Skin: Appears warm. No rash. Neurologic: Alert awake oriented to her name and place. Moves all extremities. Psychiatric: Alert and awake oriented to name and place.   Labs on Admission: I have personally reviewed following labs and imaging studies  CBC:  Recent Labs Lab 04/28/16 2013  WBC 16.0*  NEUTROABS 14.2*  HGB 14.0  HCT 41.4  MCV 94.1  PLT [id number]   Basic Metabolic Panel:  Recent Labs Lab 04/28/16 2013  NA 142  K 2.9*  CL 101  CO2 28  GLUCOSE 206*  BUN 14  CREATININE 0.94  CALCIUM 10.7*   GFR: Estimated Creatinine Clearance: 39.9 mL/min (by C-G formula based on SCr of 0.94 mg/dL). Liver Function Tests:  Recent Labs Lab 04/28/16 2013  AST 23  ALT 17  ALKPHOS 78  BILITOT 0.8  PROT 7.1  ALBUMIN 3.5   No results  for input(s): LIPASE, AMYLASE in the last 168 hours. No results for input(s): AMMONIA in the last 168 hours. Coagulation Profile: No results for input(s): INR, PROTIME in the last 168 hours. Cardiac Enzymes: No results for input(s): CKTOTAL, CKMB, CKMBINDEX, TROPONINI in the last 168 hours. BNP (last 3 results) No results for input(s): PROBNP in the last 8760 hours. HbA1C: No results for input(s): HGBA1C in the last 72 hours. CBG: No results for input(s): GLUCAP in the last 168 hours. Lipid Profile: No results for input(s): CHOL, HDL, LDLCALC, TRIG, CHOLHDL, LDLDIRECT in the last 72 hours. Thyroid Function Tests: No results for input(s): TSH, T4TOTAL, FREET4, T3FREE, THYROIDAB in the last 72 hours. Anemia Panel: No  results for input(s): VITAMINB12, FOLATE, FERRITIN, TIBC, IRON, RETICCTPCT in the last 72 hours. Urine analysis:    Component Value Date/Time   COLORURINE AMBER (A) 11/22/2015 1433   APPEARANCEUR TURBID (A) 11/22/2015 1433   LABSPEC 1.024 11/22/2015 1433   PHURINE 6.5 11/22/2015 1433   GLUCOSEU NEGATIVE 11/22/2015 1433   HGBUR LARGE (A) 11/22/2015 1433   BILIRUBINUR NEGATIVE 11/22/2015 1433   KETONESUR NEGATIVE 11/22/2015 1433   PROTEINUR 100 (A) 11/22/2015 1433   UROBILINOGEN 0.2 09/23/2014 2201   NITRITE POSITIVE (A) 11/22/2015 1433   LEUKOCYTESUR LARGE (A) 11/22/2015 1433   Sepsis Labs: @LABRCNTIP (procalcitonin:4,lacticidven:4) )No results found for this or any previous visit (from the past 240 hour(s)).   Radiological Exams on Admission: Dg Chest Portable 1 View  Result Date: 04/28/2016 CLINICAL DATA:  Respiratory distress EXAM: PORTABLE CHEST 1 VIEW COMPARISON:  11/24/2015 FINDINGS: Cardiac shadow is within normal limits. Aortic calcifications are again seen. The lungs are well aerated bilaterally. Patchy changes are noted in the right lung base. No sizable effusion is seen. No bony abnormality is noted. IMPRESSION: Patchy infiltrate in the right lung base.  Electronically Signed   By: Inez Catalina M.D.   On: 04/28/2016 21:12    EKG: Independently reviewed. Sinus tachycardia with nonspecific ST changes.  Assessment/Plan Principal Problem:   Acute respiratory failure with hypoxia (HCC) Active Problems:   A-fib (HCC)   Hypothyroidism   HLD (hyperlipidemia)   Dementia   Pneumonia of right lower lobe due to infectious organism ([place])    1. Acute respiratory failure with hypoxia - probably secondary to pneumonia. Follow cultures check influenza PCR. At this time I am pretty placing patient on antibiotics for healthcare associated pneumonia, we'll also add Zithromax. Patient is on Lasix which will need to be restarted after potassium correction due to severe hypokalemia. Last 2-D echo done was in May of this year which showed EF of 60-65%. 2. Hypokalemia - probably secondary to Lasix. Replace and recheck. Start Lasix after potassium correction. Check magnesium levels. 3. Diastolic CHF - see #1. 4. Chronic atrial fibrillation - chads 2 vasc score more than 2. Patient is on Rythmol and Cardizem. Continue Apixaban. 5. Hypothyroidism on Synthroid. Check TSH. 6. Hyperlipidemia on statins. 7. Hyperglycemia - check hemoglobin A1c. On sliding-scale coverage.   DVT prophylaxis: Apixaban. Code Status: DO NOT RESUSCITATE. Confirmed with patient's son.  Family Communication: Patient's son and daughter-in-law.  Disposition Plan: Back to the facility.  Consults called: None.  Admission status: Inpatient.    Rise Patience MD [facility] Pager [phone].  If 7PM-7AM, please contact night-coverage [email or web address] Password [facility]  04/29/2016, 12:26 AM

## 2016-04-29 NOTE — Progress Notes (Signed)
RT took patient off BIPAP and placed on nasal cannula. Patient is resting comfortably on 4L [place] with Sp02 96%. Patient has productive cough and culture obtained by RN. RT will continue to monitor patient as needed.

## 2016-04-29 NOTE — Progress Notes (Addendum)
Bipap Mask secure. No breakdown noted on face. Leak 34. MD and RN aware of WOB

## 2016-04-29 NOTE — Progress Notes (Signed)
RT placed patient back on BIPAP due to low O2 sat before transporting patient to 4E. Patient transported from ED B17 to [id number] with no complications.

## 2016-04-29 NOTE — Progress Notes (Signed)
Bipap mask secure. No breakdown noted on face. Leak 36

## 2016-04-29 NOTE — Progress Notes (Signed)
Pts Bipap mask is very loose and leaking lots of air. Spoke with RT who are looking into getting a smaller mask for pt when one becomes available. Padded chin with foam dressing and leak is still large. Pt taken off bipap and put on 4 LPM via [place] with no apparent signs of distress. Pts 02 sat staying between 93-97%. Will continue to monitor patient closely

## 2016-04-29 NOTE — ED Notes (Signed)
Ordered hospital bed @ 0040

## 2016-04-29 NOTE — Progress Notes (Addendum)
ANTICOAGULATION CONSULT NOTE - Initial Consult  Pharmacy Consult for Warfarin  Indication: atrial fibrillation   Vital Signs: Temp: 100.1 F (37.8 C) (12/25 2012) Temp Source: Rectal (12/25 2012) BP: 125/83 (12/26 0015) Pulse Rate: 101 (12/26 0015)  Labs:  Recent Labs  04/28/16 2013  HGB 14.0  HCT 41.4  PLT 307  CREATININE 0.94    Estimated Creatinine Clearance: 39.9 mL/min (by C-G formula based on SCr of 0.94 mg/dL).   Medical History: Past Medical History:  Diagnosis Date  . A-fib ([place])   . Cancer ([place])    left breast  . Constipation   . Dementia   . GERD (gastroesophageal reflux disease)   . GERD (gastroesophageal reflux disease)   . HLD (hyperlipidemia)   . Hx of radiation therapy 2001   left breast  . Hypertension   . Hypothyroidism     Assessment: 82 y/o F here with respiratory distress secondary to pulmonary edema, warfarin PTA for afib, no current INR  Goal of Therapy:  INR 2-3 Monitor platelets by anticoagulation protocol: Yes   Plan:  -Await INR with AM labs to assess dosing needs  Narda Bonds 04/29/2016,12:41 AM  =============================== 04/29/2016 2:09 AM Pt now on [place], PharmD, BCPS ===============================

## 2016-04-29 NOTE — Progress Notes (Signed)
PROGRESS NOTE  Shannon [name]  [id number] DOB: 05-01-1925 DOA: 04/28/2016 PCP: [name], MD Outpatient Specialists:  Subjective: Patient seen earlier today around 7:30 AM, she was doing okay on nasal cannula, and reversing and following commands. Called again by nursing staff round 10:30 that she has shortness of breath and respiratory distress. CXR on admission showed RLL infiltrates, and this is happened after eating breakfast, cannot rule out aspiration Place nothing by mouth, given Lasix, back on BiPAP, she is DO NOT RESUSCITATE. I went and rounded on the patient again at 33, she is on BiPAP, heart rate 120 gauge 1 dose of IV Lopressor.  Brief Narrative:  Shannon [name] is a 82 y.o. female with history of atrial fibrillation, diastolic CHF, hypertension, hypothyroidism was brought to the ER after patient was having progressively worsening shortness of breath last 2 days. As per patient's daughter-in-law who provided the history patient was given Lasix at least 3-4 doses over the last 24 hours despite which patient's shortness of breath was worsening. Since it acuity worsened yesterday evening patient was brought to the ER. Patient was placed on BiPAP. Chest x-ray was showing infiltrates and lab work showed leukocytosis and patient was febrile. Cultures were obtained and started on antibiotics for pneumonia. On my exam patient is alert awake and oriented to name and place. Follows commands and moves all extremities. Mildly short of breath while on BiPAP.  Assessment & Plan:   Principal Problem:   Acute respiratory failure with hypoxia (HCC) Active Problems:   A-fib (HCC)   Hypothyroidism   HLD (hyperlipidemia)   Dementia   Pneumonia of right lower lobe due to infectious organism ([place])   Acute respiratory failure with hypoxia -Probably secondary to pneumonia. Follow cultures check influenza PCR.  -This is likely secondary to the right lower lobe pneumonia which is can  represent aspiration. -She is on vancomycin, cefepime and azithromycin. -Patient remains critical  Hypokalemia  -This is likely secondary to Lasix, replace and recheck in a.m.  Diastolic CHF - see #1.  Chronic atrial fibrillation - chads 2 vasc score more than 2. Patient is on Rythmol and Cardizem. Continue Apixaban.  Hypothyroidism on Synthroid. Check TSH.  Hyperlipidemia on statins.  Hyperglycemia - check hemoglobin A1c. On sliding-scale coverage.   DVT prophylaxis:  Code Status: DNR Family Communication:  Disposition Plan:  Diet: Diet NPO time specified  Consultants:   None  Procedures:   None  Antimicrobials:   None   Objective: Vitals:   04/29/16 0500 04/29/16 0836 04/29/16 1035 04/29/16 1044  BP:  133/84 132/83   Pulse:  (!) 102 (!) 126   Resp:  (!) 23 (!) 44   Temp:  99 F (37.2 C)    TempSrc:  Oral    SpO2:  94% 92% 94%  Weight: 53.3 kg (117 lb 8.1 oz)     Height:        Intake/Output Summary (Last 24 hours) at 04/29/16 1116 Last data filed at 04/29/16 0104  Gross per 24 hour  Intake              500 ml  Output                2 ml  Net              498 ml   Filed Weights   04/28/16 2001 04/29/16 0500  Weight: 70.3 kg (155 lb) 53.3 kg (117 lb 8.1 oz)    Examination: General exam: Appears  calm and comfortable  Respiratory system: Clear to auscultation. Respiratory effort normal. Cardiovascular system: S1 & S2 heard, RRR. No JVD, murmurs, rubs, gallops or clicks. No pedal edema. Gastrointestinal system: Abdomen is nondistended, soft and nontender. No organomegaly or masses felt. Normal bowel sounds heard. Central nervous system: Alert and oriented. No focal neurological deficits. Extremities: Symmetric 5 x 5 power. Skin: No rashes, lesions or ulcers Psychiatry: Judgement and insight appear normal. Mood & affect appropriate.   Data Reviewed: I have personally reviewed following labs and imaging studies  CBC:  Recent Labs Lab  04/28/16 2013 04/29/16 0302  WBC 16.0* 12.9*  NEUTROABS 14.2*  --   HGB 14.0 13.2  HCT 41.4 38.9  MCV 94.1 93.3  PLT 307 [id number]   Basic Metabolic Panel:  Recent Labs Lab 04/28/16 2013 04/29/16 0302 04/29/16 0916  NA 142 141  --   K 2.9* 2.7*  --   CL 101 100*  --   CO2 28 27  --   GLUCOSE 206* 200*  --   BUN 14 15  --   CREATININE 0.94 0.95  --   CALCIUM 10.7* 10.6*  --   MG  --   --  1.7   GFR: Estimated Creatinine Clearance: 33.8 mL/min (by C-G formula based on SCr of 0.95 mg/dL). Liver Function Tests:  Recent Labs Lab 04/28/16 2013  AST 23  ALT 17  ALKPHOS 78  BILITOT 0.8  PROT 7.1  ALBUMIN 3.5   No results for input(s): LIPASE, AMYLASE in the last 168 hours. No results for input(s): AMMONIA in the last 168 hours. Coagulation Profile: No results for input(s): INR, PROTIME in the last 168 hours. Cardiac Enzymes:  Recent Labs Lab 04/29/16 0916  TROPONINI <0.03   BNP (last 3 results) No results for input(s): PROBNP in the last 8760 hours. HbA1C: No results for input(s): HGBA1C in the last 72 hours. CBG:  Recent Labs Lab 04/29/16 0834  GLUCAP 156*   Lipid Profile: No results for input(s): CHOL, HDL, LDLCALC, TRIG, CHOLHDL, LDLDIRECT in the last 72 hours. Thyroid Function Tests:  Recent Labs  04/29/16 0916  TSH 5.094*   Anemia Panel: No results for input(s): VITAMINB12, FOLATE, FERRITIN, TIBC, IRON, RETICCTPCT in the last 72 hours. Urine analysis:    Component Value Date/Time   COLORURINE AMBER (A) 11/22/2015 1433   APPEARANCEUR TURBID (A) 11/22/2015 1433   LABSPEC 1.024 11/22/2015 1433   PHURINE 6.5 11/22/2015 1433   GLUCOSEU NEGATIVE 11/22/2015 1433   HGBUR LARGE (A) 11/22/2015 1433   BILIRUBINUR NEGATIVE 11/22/2015 1433   KETONESUR NEGATIVE 11/22/2015 1433   PROTEINUR 100 (A) 11/22/2015 1433   UROBILINOGEN 0.2 09/23/2014 2201   NITRITE POSITIVE (A) 11/22/2015 1433   LEUKOCYTESUR LARGE (A) 11/22/2015 1433   Sepsis  Labs: @LABRCNTIP (procalcitonin:4,lacticidven:4)  ) Recent Results (from the past 240 hour(s))  Culture, sputum-assessment     Status: None   Collection Time: 04/29/16  2:12 AM  Result Value Ref Range Status   Specimen Description EXPECTORATED SPUTUM  Final   Special Requests NONE  Final   Sputum evaluation THIS SPECIMEN IS ACCEPTABLE FOR SPUTUM CULTURE  Final   Report Status 04/29/2016 FINAL  Final  Culture, respiratory (NON-Expectorated)     Status: None (Preliminary result)   Collection Time: 04/29/16  2:12 AM  Result Value Ref Range Status   Specimen Description EXPECTORATED SPUTUM  Final   Special Requests NONE Reflexed from [id number]  Final   Gram Stain   Final  DEGENERATED CELLULAR MATERIAL PRESENT RARE SQUAMOUS EPITHELIAL CELLS PRESENT MODERATE GRAM POSITIVE COCCI IN PAIRS IN CLUSTERS FEW GRAM NEGATIVE RODS RARE GRAM POSITIVE RODS    Culture PENDING  Incomplete   Report Status PENDING  Incomplete  MRSA PCR Screening     Status: None   Collection Time: 04/29/16  4:59 AM  Result Value Ref Range Status   MRSA by PCR NEGATIVE NEGATIVE Final    Comment:        The GeneXpert MRSA Assay (FDA approved for NASAL specimens only), is one component of a comprehensive MRSA colonization surveillance program. It is not intended to diagnose MRSA infection nor to guide or monitor treatment for MRSA infections.      Invalid input(s): PROCALCITONIN, LACTICACIDVEN   Radiology Studies: Dg Chest Portable 1 View  Result Date: 04/28/2016 CLINICAL DATA:  Respiratory distress EXAM: PORTABLE CHEST 1 VIEW COMPARISON:  11/24/2015 FINDINGS: Cardiac shadow is within normal limits. Aortic calcifications are again seen. The lungs are well aerated bilaterally. Patchy changes are noted in the right lung base. No sizable effusion is seen. No bony abnormality is noted. IMPRESSION: Patchy infiltrate in the right lung base. Electronically Signed   By: Inez Catalina M.D.   On: 04/28/2016 21:12         Scheduled Meds: . apixaban  2.5 mg Oral BID  . atorvastatin  10 mg Oral QHS  . azithromycin  500 mg Intravenous Q24H  . ceFEPime (MAXIPIME) IV  1 g Intravenous Q12H  . cholecalciferol  2,000 Units Oral Daily  . diltiazem  180 mg Oral Daily  . feeding supplement (ENSURE ENLIVE)  237 mL Oral BID BM  . insulin aspart  0-9 Units Subcutaneous TID WC  . levothyroxine  137 mcg Oral QAC breakfast  . metoprolol  5 mg Intravenous Once  . mirabegron ER  50 mg Oral Daily  . pantoprazole  40 mg Oral Daily  . polyethylene glycol  17 g Oral Daily  . potassium chloride (KCL MULTIRUN) 30 mEq in 265 mL IVPB  30 mEq Intravenous Once  . propafenone  150 mg Oral Q8H  . sertraline  25 mg Oral Daily  . [START ON 04/30/2016] vancomycin  750 mg Intravenous Q48H   Continuous Infusions:   LOS: 1 day    Time spent: 35 minutes    Santo Zahradnik A, MD [facility] Pager [phone]  If 7PM-7AM, please contact night-coverage [email or web address] Password [facility] 04/29/2016, 11:16 AM

## 2016-04-29 NOTE — Progress Notes (Signed)
Pharmacy Antibiotic Note  Shannon [name] is a 82 y.o. female admitted on 04/28/2016 with SOB, possible HCAP.  Pharmacy has been consulted for Vancomycin and Cefepime  Dosing.  Vancomycin 1 g IV given in ED at 2230  Plan: Vancomycin 750 mg IV q48h Cefepime 1 g IV q12h   Height: 5\' 5"  (165.1 cm) Weight: 117 lb 8.1 oz (53.3 kg) IBW/kg (Calculated) : 57  Temp (24hrs), Avg:98.9 F (37.2 C), Min:97.8 F (36.6 C), Max:100.1 F (37.8 C)   Recent Labs Lab 04/28/16 2013 04/29/16 0302  WBC 16.0* 12.9*  CREATININE 0.94 0.95    Estimated Creatinine Clearance: 33.8 mL/min (by C-G formula based on SCr of 0.95 mg/dL).    Allergies  Allergen Reactions  . Demerol Nausea And Vomiting  . Keflet [Cephalexin] Nausea And Vomiting  . Sulfa Antibiotics Nausea And Vomiting  . Tequin Nausea Only  . Penicillins Rash    Caryl Pina 04/29/2016 6:24 AM

## 2016-04-29 NOTE — Progress Notes (Signed)
RT called to the pts bedside. RN had stated she NTS pt and the pt was desating prior to NTS. RN sxn food particles along with white frothy sputum. RN notified MD of possible aspiration and RT findings of BBS Crackles - possible fluid. RT placed pt on 10L HFNC sats became stable. RT worked with pt on deep breathing and coughing. Pt had a strong PC small food particles and white frothy sputum. RN notified MD of pts WOB. RT placed pt on Bipap 18/6, 60% for WOB. BBS crackles. Post placing pt on bipap pt still has increased HR, RR 40s, sats 94%, BBS crackles.WOB on bipap still labored sats stable at 94%.

## 2016-04-30 DIAGNOSIS — J9601 Acute respiratory failure with hypoxia: Secondary | ICD-10-CM

## 2016-04-30 DIAGNOSIS — J690 Pneumonitis due to inhalation of food and vomit: Principal | ICD-10-CM

## 2016-04-30 DIAGNOSIS — E870 Hyperosmolality and hypernatremia: Secondary | ICD-10-CM

## 2016-04-30 LAB — BASIC METABOLIC PANEL
Anion gap: 13 (ref 5–15)
BUN: 22 mg/dL — AB (ref 6–20)
CO2: 23 mmol/L (ref 22–32)
Calcium: 10.9 mg/dL — ABNORMAL HIGH (ref 8.9–10.3)
Chloride: 113 mmol/L — ABNORMAL HIGH (ref 101–111)
Creatinine, Ser: 0.92 mg/dL (ref 0.44–1.00)
GFR calc Af Amer: >60 mL/min (ref >60)
GFR, EST NON AFRICAN AMERICAN: 54 mL/min — AB (ref >60)
Glucose, Bld: 149 mg/dL — ABNORMAL HIGH (ref 65–99)
POTASSIUM: 4.1 mmol/L (ref 3.5–5.1)
SODIUM: 149 mmol/L — AB (ref 135–145)

## 2016-04-30 LAB — GLUCOSE, CAPILLARY
GLUCOSE-CAPILLARY: 139 mg/dL — AB (ref 65–99)
GLUCOSE-CAPILLARY: 125 mg/dL — AB (ref 65–99)
GLUCOSE-CAPILLARY: 121 mg/dL — AB (ref 65–99)
GLUCOSE-CAPILLARY: 124 mg/dL — AB (ref 65–99)

## 2016-04-30 LAB — URINE CULTURE: CULTURE: NO GROWTH

## 2016-04-30 LAB — CBC
HCT: 39.1 % (ref 36.0–46.0)
Hemoglobin: 12.6 g/dL (ref 12.0–15.0)
MCH: 31 pg (ref 26.0–34.0)
MCHC: 32.2 g/dL (ref 30.0–36.0)
MCV: 96.1 fL (ref 78.0–100.0)
PLATELETS: 265 10*3/uL (ref 150–400)
RBC: 4.07 MIL/uL (ref 3.87–5.11)
RDW: 14.7 % (ref 11.5–15.5)
WBC: 9.7 10*3/uL (ref 4.0–10.5)

## 2016-04-30 LAB — STREP PNEUMONIAE URINARY ANTIGEN: STREP PNEUMO URINARY ANTIGEN: NEGATIVE

## 2016-04-30 LAB — LEGIONELLA PNEUMOPHILA SEROGP 1 UR AG: L. PNEUMOPHILA SEROGP 1 UR AG: NEGATIVE

## 2016-04-30 MED ORDER — METOPROLOL TARTRATE 5 MG/5ML IV SOLN: 5.0000 mg | Freq: Once | INTRAVENOUS | Status: DC

## 2016-04-30 MED ORDER — SODIUM CHLORIDE 0.9 % IV BOLUS (SEPSIS)
500.0000 mL | Freq: Once | INTRAVENOUS | Status: AC
  Administered 2016-04-30: 500 mL via INTRAVENOUS

## 2016-04-30 MED ORDER — AMIODARONE HCL IN DEXTROSE 360-4.14 MG/200ML-% IV SOLN
60.0000 mg/h | INTRAVENOUS | Status: AC
  Administered 2016-04-30 (×2): 60 mg/h via INTRAVENOUS
  Filled 2016-04-30: qty 200

## 2016-04-30 MED ORDER — DEXTROSE 5 % IV SOLN
INTRAVENOUS | Status: AC
  Administered 2016-04-30: 15:00:00 via INTRAVENOUS

## 2016-04-30 MED ORDER — DIGOXIN 0.25 MG/ML IJ SOLN
0.2500 mg | Freq: Once | INTRAMUSCULAR | Status: AC
  Administered 2016-04-30: 0.25 mg via INTRAVENOUS
  Filled 2016-04-30 (×2): qty 2

## 2016-04-30 MED ORDER — AMIODARONE HCL IN DEXTROSE 360-4.14 MG/200ML-% IV SOLN
30.0000 mg/h | INTRAVENOUS | Status: DC
  Administered 2016-05-01 – 2016-05-02 (×3): 30 mg/h via INTRAVENOUS
  Filled 2016-04-30 (×4): qty 200

## 2016-04-30 MED ORDER — DILTIAZEM HCL 25 MG/5ML IV SOLN
10.0000 mg | Freq: Once | INTRAVENOUS | Status: AC
  Administered 2016-04-30: 10 mg via INTRAVENOUS
  Filled 2016-04-30: qty 5

## 2016-04-30 MED ORDER — SODIUM CHLORIDE 0.9% FLUSH
10.0000 mL | INTRAVENOUS | Status: DC | PRN
  Administered 2016-05-01: 10 mL
  Filled 2016-04-30: qty 40

## 2016-04-30 MED ORDER — AMIODARONE HCL IN DEXTROSE 360-4.14 MG/200ML-% IV SOLN: 60.0000 mg/h | INTRAVENOUS | Status: DC

## 2016-04-30 MED ORDER — AMIODARONE HCL IN DEXTROSE 360-4.14 MG/200ML-% IV SOLN: 30.0000 mg/h | INTRAVENOUS | Status: DC

## 2016-04-30 MED ORDER — DILTIAZEM HCL 25 MG/5ML IV SOLN
10.0000 mg | Freq: Four times a day (QID) | INTRAVENOUS | Status: DC | PRN
  Administered 2016-04-30: 10 mg via INTRAVENOUS
  Filled 2016-04-30 (×2): qty 5

## 2016-04-30 MED ORDER — DEXTROSE 5 % IV SOLN
1.0000 g | INTRAVENOUS | Status: DC
  Administered 2016-04-30 – 2016-05-01 (×2): 1 g via INTRAVENOUS
  Filled 2016-04-30 (×3): qty 1

## 2016-04-30 MED ORDER — ORAL CARE MOUTH RINSE
15.0000 mL | Freq: Two times a day (BID) | OROMUCOSAL | Status: DC
  Administered 2016-04-30 – 2016-05-02 (×4): 15 mL via OROMUCOSAL

## 2016-04-30 MED ORDER — DILTIAZEM HCL 100 MG IV SOLR
5.0000 mg/h | INTRAVENOUS | Status: DC
  Administered 2016-04-30: 10 mg/h via INTRAVENOUS
  Administered 2016-04-30: 7.5 mg/h via INTRAVENOUS
  Administered 2016-04-30: 5 mg/h via INTRAVENOUS
  Filled 2016-04-30: qty 100

## 2016-04-30 MED ORDER — AMIODARONE LOAD VIA INFUSION: 150.0000 mg | Freq: Once | INTRAVENOUS | Status: DC

## 2016-04-30 NOTE — Progress Notes (Signed)
Multiple discussions with bedside RN [place] regarding pt's HR and rhythm.  Discussed cardizem gtt titration, initiation of amio gtt, cardizem push, pt's neuro status, and other vital signs.  Will continue to monitor from [facility], and will pass along events of the day to night shift Elink RN.

## 2016-04-30 NOTE — Progress Notes (Signed)
Initial Nutrition Assessment  DOCUMENTATION CODES:   Not applicable  INTERVENTION:    PO diet advancement vs TF initiation    If TF desired, recommend placing CORTRAK small bore feeding tube   NUTRITION DIAGNOSIS:   Inadequate oral intake related to inability to eat as evidenced by NPO status  GOAL:   Patient will meet greater than or equal to 90% of their needs  MONITOR:   Diet advancement, Labs, Weight trends, I & O's, TF initiation   REASON FOR ASSESSMENT:   Low Braden  ASSESSMENT:   82 y.o. Female with history of atrial fibrillation, diastolic CHF, hypertension, hypothyroidism was brought to the ER after patient was having progressively worsening shortness of breath last 2 days. As per patient's daughter-in-law who provided the history patient was given Lasix at least 3-4 doses over the last 24 hours despite which patient's shortness of breath was worsening. Since it acuity worsened yesterday evening patient was brought to the ER.  Patient having blood drawn upon visit. Was on BiPAP this AM >> now on HFNC. S/p bedside swallow evaluation today >> SLP rec NPO status. Low braden score places patient at risk for skin breakdown. CBG's [id number].  RD unable to complete Nutrition Focused Physical Exam at this time, however, suspect malnutrition.  Diet Order:  Diet NPO time specified  Skin:  Reviewed, no issues  Last BM:  PTA  Height:   Ht Readings from Last 1 Encounters:  04/28/16 5\' 5"  (1.651 m)    Weight:   Wt Readings from Last 1 Encounters:  04/29/16 117 lb 8.1 oz (53.3 kg)    Ideal Body Weight:  56.8 kg  BMI:  Body mass index is 19.55 kg/m.  Estimated Nutritional Needs:   Kcal:  1300-1500  Protein:  65-75 gm  Fluid:  >/= 1.5 L  EDUCATION NEEDS:   No education needs identified at this time  Arthur Holms, RD, LDN Pager #: [phone] After-Hours Pager #: [phone]

## 2016-04-30 NOTE — Evaluation (Signed)
Clinical/Bedside Swallow Evaluation Patient Details  Name: Shannon [name] MRN: [id number] Date of Birth: [birth year]  Today's Date: 04/30/2016 Time: SLP Start Time (ACUTE ONLY): 0940 SLP Stop Time (ACUTE ONLY): 1000 SLP Time Calculation (min) (ACUTE ONLY): 20 min  Past Medical History:  Past Medical History:  Diagnosis Date  . A-fib ([place])   . Cancer ([place])    left breast  . Constipation   . Dementia   . GERD (gastroesophageal reflux disease)   . GERD (gastroesophageal reflux disease)   . HLD (hyperlipidemia)   . Hx of radiation therapy 2001   left breast  . Hypertension   . Hypothyroidism    Past Surgical History:  Past Surgical History:  Procedure Laterality Date  . BREAST SURGERY    . FEMUR IM NAIL Left 11/23/2015   Procedure: INTRAMEDULLARY (IM) NAIL INTERTROCHANTRIC LEFT HIP;  Surgeon: Meredith Pel, MD;  Location: [facility];  Service: Orthopedics;  Laterality: Left;  . INTRAMEDULLARY (IM) NAIL INTERTROCHANTERIC Right 09/24/2014   Procedure: INTRAMEDULLARY (IM) NAIL INTERTROCHANTRIC;  Surgeon: Meredith Pel, MD;  Location: [facility];  Service: Orthopedics;  Laterality: Right;   HPI:  Shannon [name] a 82 y.o.femalewith history of atrial fibrillation, diastolic CHF, hypertension, hypothyroidism was brought to the ER after patient was having progressively worsening shortness of breath last 2 days. As per patient's daughter-in-law who provided the history patient was given Lasix at least 3-4 doses over the last 24 hours despite whichpatient's shortness of breath was worsening. Since it acuity worsened yesterday evening patient was brought to the ER. Patient was placed on BiPAP. Chest x-ray was showing infiltrates and lab work showed leukocytosis and patient was febrile. Cultures were obtained and started on antibiotics for pneumonia. On my exam patient is alert awake and oriented to name and place. Follows commands and moves all extremities. Mildly short of breath while on  BiPAP. on 04/29/2016 RT called to the pts bedside. RN had stated she NTS pt and the pt was desating prior to NTS. RN sxn food particles along with white frothy sputum. RN notified MD of possible aspiration and RT findings of BBS Crackles - possible fluid. RT placed pt on 10L HFNC sats became stable. RT worked with pt on deep breathing and coughing. Pt had a strong PC small food particles and white frothy sputum. RN notified MD of pts WOB. RT placed pt on Bipap 18/6, 60% for WOB. BBS crackles. Post placing pt on bipap pt still has increased HR, RR 40s, sats 94%, BBS crackles.WOB on bipap still labored sats stable at 94%.  Of note, pt with Modified Barium Swallow Study on 11/26/2015 which revealed Severe cervical esophageal phase dysphagia and Moderate cervical esophageal phase dysphagia d/t suspected Zenker's diverticulum, poor clearnance of baium through cervical esophagus due to stasis from diverticulum and resultant backflow into pharynx/pyriform sinus WITHOUT pt sensation. Pt discharged on dysphagia 2 diet with thin liquids and strict aspiration precautions.    Assessment / Plan / Recommendation Clinical Impression  Pt presents with significant risk for aspiration and should remain NPO. Nursing advised that RT recently placed pt on HFNC from BiPap. Vitals currently stable. However when SLP entered room, pt requested "to go back on tube." Pt describes increased WOB however no phsyical s/s of increased WOB noted. Pt with copious amounts of thick foamy secretions spilling out of the right side of pt's mouth and significant amount of secretions of towel at bedside. During oral care, pt's O2 decreased to 92% but recovered to  97%. Pt given trials of ice chips via spoon. Pt's RR increased from 19 to 35 with O2 decreasing from 97% to 91%. No overt s/s of increased WOB noted. Pt with immediate cough on seond trial of ice chips. Trials discontinued and O2 returned to 97%. Pt with weak cough and unable to follow SLP's  directions for strong cough/throat clear. Pt's voice wet after consumption with difficulty clearing. Pt presents with significant acute on chronic dysphagia suggestive of decreased airway protection with trials of PO. Recommend NPO. Instrumental swallow study indicated when pt able to tolerate trials of PO without respiratory decline.      Aspiration Risk  Risk for inadequate nutrition/hydration;Severe aspiration risk    Diet Recommendation NPO   Medication Administration: Via alternative means    Other  Recommendations Recommended Consults: Consider esophageal assessment Oral Care Recommendations: Oral care QID Other Recommendations: Prohibited food (jello, ice cream, thin soups);Remove water pitcher;Have oral suction available   Follow up Recommendations Skilled Nursing facility      Frequency and Duration min 2x/week  2 weeks       Prognosis Prognosis for Safe Diet Advancement: Guarded Barriers to Reach Goals: Cognitive deficits;Time post onset;Severity of deficits      Swallow Study   General Date of Onset: 04/28/16 HPI: Shannon [name] a 82 y.o.femalewith history of atrial fibrillation, diastolic CHF, hypertension, hypothyroidism was brought to the ER after patient was having progressively worsening shortness of breath last 2 days. As per patient's daughter-in-law who provided the history patient was given Lasix at least 3-4 doses over the last 24 hours despite whichpatient's shortness of breath was worsening. Since it acuity worsened yesterday evening patient was brought to the ER. Patient was placed on BiPAP. Chest x-ray was showing infiltrates and lab work showed leukocytosis and patient was febrile. Cultures were obtained and started on antibiotics for pneumonia. On my exam patient is alert awake and oriented to name and place. Follows commands and moves all extremities. Mildly short of breath while on BiPAP. on 04/29/2016 RT called to the pts bedside. RN had stated she NTS  pt and the pt was desating prior to NTS. RN sxn food particles along with white frothy sputum. RN notified MD of possible aspiration and RT findings of BBS Crackles - possible fluid. RT placed pt on 10L HFNC sats became stable. RT worked with pt on deep breathing and coughing. Pt had a strong PC small food particles and white frothy sputum. RN notified MD of pts WOB. RT placed pt on Bipap 18/6, 60% for WOB. BBS crackles. Post placing pt on bipap pt still has increased HR, RR 40s, sats 94%, BBS crackles.WOB on bipap still labored sats stable at 94%.  Of note, pt with Modified Barium Swallow Study on 11/26/2015 which revealed Severe cervical esophageal phase dysphagia and Moderate cervical esophageal phase dysphagia d/t suspected Zenker's diverticulum, poor clearnance of baium through cervical esophagus due to stasis from diverticulum and resultant backflow into pharynx/pyriform sinus WITHOUT pt sensation. Pt discharged on dysphagia 2 diet with thin liquids and strict aspiration precautions.  Type of Study: Bedside Swallow Evaluation Previous Swallow Assessment: MBS - 11/26/2015 which revealed Severe cervical esophageal phase dysphagia and Moderate cervical esophageal phase dysphagia d/t suspected Zenker's diverticulum, poor clearnance of baium through cervical esophagus due to stasis from diverticulum and resultant backflow into pharynx/pyriform sinus WITHOUT pt sensation. Pt discharged on dysphagia 2 diet with thin liquids and strict aspiration precautions.  Diet Prior to this Study: NPO Temperature Spikes Noted:  No Respiratory Status: Nasal cannula (HFNC) History of Recent Intubation: No Behavior/Cognition: Alert;Cooperative;Confused;Doesn't follow directions;Requires cueing Oral Cavity Assessment: Dried secretions;Excessive secretions Oral Care Completed by SLP: Yes Oral Cavity - Dentition: Adequate natural dentition Vision:  (Unable to determine) Self-Feeding Abilities: Total assist Patient  Positioning: Upright in bed Baseline Vocal Quality: Low vocal intensity Volitional Cough: Weak Volitional Swallow: Unable to elicit    Oral/Motor/Sensory Function Overall Oral Motor/Sensory Function: Mild impairment Facial ROM:  (overall weakness) Facial Symmetry: Within Functional Limits Facial Strength: Within Functional Limits Facial Sensation: Within Functional Limits Lingual ROM:  (general weakness) Lingual Symmetry: Within Functional Limits Lingual Strength:  (overall general weakness) Lingual Sensation: Within Functional Limits Mandible: Within Functional Limits   Ice Chips Ice chips: Impaired Presentation: Spoon Pharyngeal Phase Impairments: Suspected delayed Swallow;Decreased hyoid-laryngeal movement;Wet Vocal Quality;Cough - Immediate;Change in Vital Signs Other Comments: Increased RR from 19 to 35 during trials, decreased O2 from 97% to 91%, no overt s/s of increased WOB but pt reports increased WOB at baseline   Thin Liquid Thin Liquid: Not tested    Nectar Thick Nectar Thick Liquid: Not tested   Honey Thick Honey Thick Liquid: Not tested   Puree Puree: Not tested   Solid   GO   Solid: Not tested       Shannon [name] B. Rutherford Nail M.S., CCC-SLP Speech-Language Pathologist  Shannon [name] 04/30/2016,10:21 AM

## 2016-04-30 NOTE — Significant Event (Signed)
Rapid Response Event Note  Overview: Time Called: 1845 Arrival Time: 1848 Event Type: Cardiac  Initial Focused Assessment: Rapid heartrate   Interventions:  Plan of Care (if not transferred):  Event Summary: Called to assist with care of patient with rapid heart rate 150-170. Chart was review with unit staff. Patient is alert and responsive. No acute distress noted. No complaints of chest pain or shortness of breath. Skin is warm and dry. Patient is in atrial fib on the monitor. Reported that the oncall provider was made aware and orders were received for IV cardizem and IV amiodarone. At present, patient continues on IV amiondarone with HR in the 150's. On call provider was called for more orders.     Eino Farber [place]

## 2016-04-30 NOTE — Discharge Instructions (Signed)
Apixaban oral tablets °What is this medicine? °APIXABAN (a PIX a ban) is an anticoagulant (blood thinner). It is used to lower the chance of stroke in people with a medical condition called atrial fibrillation. It is also used to treat or prevent blood clots in the lungs or in the veins. °COMMON BRAND NAME(S): Eliquis °What should I tell my health care provider before I take this medicine? °They need to know if you have any of these conditions: °-bleeding disorders °-bleeding in the brain °-blood in your stools (black or tarry stools) or if you have blood in your vomit °-history of stomach bleeding °-kidney disease °-liver disease °-mechanical heart valve °-an unusual or allergic reaction to apixaban, other medicines, foods, dyes, or preservatives °-pregnant or trying to get pregnant °-breast-feeding °How should I use this medicine? °Take this medicine by mouth with a glass of water. Follow the directions on the prescription label. You can take it with or without food. If it upsets your stomach, take it with food. Take your medicine at regular intervals. Do not take it more often than directed. Do not stop taking except on your doctor's advice. Stopping this medicine may increase your risk of a blot clot. Be sure to refill your prescription before you run out of medicine. °Talk to your pediatrician regarding the use of this medicine in children. Special care may be needed. °What if I miss a dose? °If you miss a dose, take it as soon as you can. If it is almost time for your next dose, take only that dose. Do not take double or extra doses. °What may interact with this medicine? °This medicine may interact with the following: °-aspirin and aspirin-like medicines °-certain medicines for fungal infections like ketoconazole and itraconazole °-certain medicines for seizures like carbamazepine and phenytoin °-certain medicines that treat or prevent blood clots like warfarin, enoxaparin, and  dalteparin °-clarithromycin °-NSAIDs, medicines for pain and inflammation, like ibuprofen or naproxen °-rifampin °-ritonavir °-St. John's wort °What should I watch for while using this medicine? °Notify your doctor or health care professional and seek emergency treatment if you develop breathing problems; changes in vision; chest pain; severe, sudden headache; pain, swelling, warmth in the leg; trouble speaking; sudden numbness or weakness of the face, arm, or leg. These can be signs that your condition has gotten worse. °If you are going to have surgery, tell your doctor or health care professional that you are taking this medicine. °Tell your health care professional that you use this medicine before you have a spinal or epidural procedure. Sometimes people who take this medicine have bleeding problems around the spine when they have a spinal or epidural procedure. This bleeding is very rare. If you have a spinal or epidural procedure while on this medicine, call your health care professional immediately if you have back pain, numbness or tingling (especially in your legs and feet), muscle weakness, paralysis, or loss of bladder or bowel control. °Avoid sports and activities that might cause injury while you are using this medicine. Severe falls or injuries can cause unseen bleeding. Be careful when using sharp tools or knives. Consider using an electric razor. Take special care brushing or flossing your teeth. Report any injuries, bruising, or red spots on the skin to your doctor or health care professional. °What side effects may I notice from receiving this medicine? °Side effects that you should report to your doctor or health care professional as soon as possible: °-allergic reactions like skin rash, itching or hives, swelling   of the face, lips, or tongue °-signs and symptoms of bleeding such as bloody or black, tarry stools; red or dark-brown urine; spitting up blood or brown material that looks like coffee  grounds; red spots on the skin; unusual bruising or bleeding from the eye, gums, or nose °Where should I keep my medicine? °Keep out of the reach of children. °Store at room temperature between 20 and 25 degrees C (68 and 77 degrees F). Throw away any unused medicine after the expiration date. °© 2017 [employer]/Gold Standard (2015-05-24 09:26:49) ° °

## 2016-04-30 NOTE — Progress Notes (Signed)
PROGRESS NOTE                                                                                                                                                                                                             Patient Demographics:    Shannon [name], is a 82 y.o. female, DOB - [birth year], [id number]  Admit date - 04/28/2016   Admitting Physician Rise Patience, MD  Outpatient Primary MD for the patient is [name], MD  LOS - 2  Outpatient Specialists:None  Chief Complaint  Patient presents with  . Respiratory Distress       Brief Narrative   82 year old female with A. fib on anticoagulation, diastolic CHF, hypertension, hypothyroidism history of cervical dysphagia brought to the ED with progressive shortness of breath for past 2 days patient's family give extra doses of Lasix without much improvement in her dyspnea. In the ED she was in acute hypoxic respiratory failure requiring BiPAP. Chest x-ray showing infiltrates suggestive of pneumonia. She had leukocytosis on her blood work. Blood cultures sent and started on empiric antibiotics and admitted to stepdown unit on BiPAP.  Subjective:   Patient on BiPAP during my exam this morning. Lung sounds were clear. Taken off BiPAP and placed on nasal cannula, tolerated well.   Assessment  & Plan :    Principal Problem:   Acute respiratory failure with hypoxia (HCC) Suspect secondary to aspiration pneumonia. Continue empiric vancomycin and cefepime. Blood cultures so far negative. Flu PCR negative. -Off BiPAP and placed on high flow [name].   Active Problems: Aspiration pneumonia Patient has had barium swallow done in July this year showing severe cervical esophageal phase dysphagia and moderate cervical phase dysphagia due to suspected Zenker's diverticulum . She was discharged on dysphagia level II diet with thin liquid and strict aspiration  precautions. -Seen by swallow eval nurse this morning after patient off BiPAP and recommended to keep NPO for now.    A-fib ([place]) Rate controlled. Off cardizem and eliquis since she's strict NPO.  place on prn IV cardizem.    Hypothyroidism Hold home meds  Protein Calorie malnutrition. Hold supplements while nothing by mouth.  Hypokalemia Replenished  Diabetes mellitus type 2 Monitor on sliding scale coverage.  Hypernatremia Will place on D5W , pt NPO as well    Will determine further swallow evaluation and  feeding / oral medication options onset respiratory status is improved. Currently her condition is guarded.    Code Status : DO NOT RESUSCITATE  Family Communication  : None at bedside  Disposition Plan  : Continue step down monitoring  Barriers For Discharge : Active symptoms  Consults  :  None  Procedures  : None  DVT Prophylaxis  :  SCDs/ eliquis  Lab Results  Component Value Date   PLT 265 04/30/2016    Antibiotics  :    Anti-infectives    Start     Dose/Rate Route Frequency Ordered Stop   04/30/16 2200  vancomycin (VANCOCIN) IVPB 750 mg/150 ml premix     750 mg 150 mL/hr over 60 Minutes Intravenous Every 48 hours 04/29/16 0626     04/30/16 2156  ceFEPIme (MAXIPIME) 1 g in dextrose 5 % 50 mL IVPB     1 g 100 mL/hr over 30 Minutes Intravenous Every 24 hours 04/30/16 0809     04/29/16 2200  azithromycin (ZITHROMAX) 500 mg in dextrose 5 % 250 mL IVPB     500 mg 250 mL/hr over 60 Minutes Intravenous Every 24 hours 04/29/16 0557     04/29/16 0800  ceFEPIme (MAXIPIME) 1 g in dextrose 5 % 50 mL IVPB  Status:  Discontinued     1 g 100 mL/hr over 30 Minutes Intravenous Every 12 hours 04/29/16 0626 04/30/16 0809   04/28/16 2230  vancomycin (VANCOCIN) IVPB 1000 mg/200 mL premix     1,000 mg 200 mL/hr over 60 Minutes Intravenous  Once 04/28/16 2218 04/29/16 0041   04/28/16 2215  cefTRIAXone (ROCEPHIN) 1 g in dextrose 5 % 50 mL IVPB     1 g 100 mL/hr over  30 Minutes Intravenous  Once 04/28/16 2204 04/28/16 2319   04/28/16 2215  azithromycin (ZITHROMAX) 500 mg in dextrose 5 % 250 mL IVPB     500 mg 250 mL/hr over 60 Minutes Intravenous  Once 04/28/16 2204 04/29/16 0104        Objective:   Vitals:   04/30/16 0816 04/30/16 0821 04/30/16 0902 04/30/16 1218  BP: (!) 143/89   135/78  Pulse:      Resp:      Temp: 97.9 F (36.6 C)   98.2 F (36.8 C)  TempSrc: Axillary   Oral  SpO2:  100% 100%   Weight:      Height:        Wt Readings from Last 3 Encounters:  04/29/16 53.3 kg (117 lb 8.1 oz)  11/21/15 70.3 kg (155 lb)  09/22/15 69.4 kg (153 lb)     Intake/Output Summary (Last 24 hours) at 04/30/16 1301 Last data filed at 04/29/16 2156  Gross per 24 hour  Intake              550 ml  Output                0 ml  Net              550 ml     Physical Exam  Gen: Elderly female on BiPAP HEENT: on BiPAP Chest: clear b/l, no added sounds CVS: S1 and S2 irregular, no murmurs rub or gallop GI: soft, NT, ND, BS+ Musculoskeletal: warm, no edema     Data Review:    CBC  Recent Labs Lab 04/28/16 2013 04/29/16 0302 04/30/16 0322  WBC 16.0* 12.9* 9.7  HGB 14.0 13.2 12.6  HCT 41.4 38.9 39.1  PLT 307  280 265  MCV 94.1 93.3 96.1  MCH 31.8 31.7 31.0  MCHC 33.8 33.9 32.2  RDW 14.6 14.5 14.7  LYMPHSABS 0.8  --   --   MONOABS 1.1*  --   --   EOSABS 0.0  --   --   BASOSABS 0.0  --   --     Chemistries   Recent Labs Lab 04/28/16 2013 04/29/16 0302 04/29/16 0916 04/30/16 0322  NA 142 141  --  149*  K 2.9* 2.7*  --  4.1  CL 101 100*  --  113*  CO2 28 27  --  23  GLUCOSE 206* 200*  --  149*  BUN 14 15  --  22*  CREATININE 0.94 0.95  --  0.92  CALCIUM 10.7* 10.6*  --  10.9*  MG  --   --  1.7  --   AST 23  --   --   --   ALT 17  --   --   --   ALKPHOS 78  --   --   --   BILITOT 0.8  --   --   --     ------------------------------------------------------------------------------------------------------------------ No results for input(s): CHOL, HDL, LDLCALC, TRIG, CHOLHDL, LDLDIRECT in the last 72 hours.  Lab Results  Component Value Date   HGBA1C 5.6 04/29/2016   ------------------------------------------------------------------------------------------------------------------  Recent Labs  04/29/16 0916  TSH 5.094*   ------------------------------------------------------------------------------------------------------------------ No results for input(s): VITAMINB12, FOLATE, FERRITIN, TIBC, IRON, RETICCTPCT in the last 72 hours.  Coagulation profile No results for input(s): INR, PROTIME in the last 168 hours.  No results for input(s): DDIMER in the last 72 hours.  Cardiac Enzymes  Recent Labs Lab 04/29/16 0916  TROPONINI <0.03   ------------------------------------------------------------------------------------------------------------------    Component Value Date/Time   BNP 42.5 04/28/2016 2013    Inpatient Medications  Scheduled Meds: . apixaban  2.5 mg Oral BID  . atorvastatin  10 mg Oral QHS  . azithromycin  500 mg Intravenous Q24H  . ceFEPime (MAXIPIME) IV  1 g Intravenous Q24H  . cholecalciferol  2,000 Units Oral Daily  . diltiazem  180 mg Oral Daily  . feeding supplement (ENSURE ENLIVE)  237 mL Oral BID BM  . insulin aspart  0-9 Units Subcutaneous TID WC  . levothyroxine  137 mcg Oral QAC breakfast  . mirabegron ER  50 mg Oral Daily  . pantoprazole  40 mg Oral Daily  . polyethylene glycol  17 g Oral Daily  . propafenone  150 mg Oral Q8H  . sertraline  25 mg Oral Daily  . vancomycin  750 mg Intravenous Q48H   Continuous Infusions: PRN Meds:.acetaminophen **OR** acetaminophen, ondansetron **OR** ondansetron (ZOFRAN) IV  Micro Results Recent Results (from the past 240 hour(s))  Culture, sputum-assessment     Status: None   Collection Time:  04/29/16  2:12 AM  Result Value Ref Range Status   Specimen Description EXPECTORATED SPUTUM  Final   Special Requests NONE  Final   Sputum evaluation THIS SPECIMEN IS ACCEPTABLE FOR SPUTUM CULTURE  Final   Report Status 04/29/2016 FINAL  Final  Culture, respiratory (NON-Expectorated)     Status: None (Preliminary result)   Collection Time: 04/29/16  2:12 AM  Result Value Ref Range Status   Specimen Description EXPECTORATED SPUTUM  Final   Special Requests NONE Reflexed from [id number]  Final   Gram Stain   Final    DEGENERATED CELLULAR MATERIAL PRESENT RARE SQUAMOUS EPITHELIAL CELLS PRESENT MODERATE GRAM POSITIVE COCCI  IN PAIRS IN CLUSTERS FEW GRAM NEGATIVE RODS RARE GRAM POSITIVE RODS    Culture CULTURE REINCUBATED FOR BETTER GROWTH  Final   Report Status PENDING  Incomplete  MRSA PCR Screening     Status: None   Collection Time: 04/29/16  4:59 AM  Result Value Ref Range Status   MRSA by PCR NEGATIVE NEGATIVE Final    Comment:        The GeneXpert MRSA Assay (FDA approved for NASAL specimens only), is one component of a comprehensive MRSA colonization surveillance program. It is not intended to diagnose MRSA infection nor to guide or monitor treatment for MRSA infections.     Radiology Reports Dg Chest Port 1 View  Result Date: 04/29/2016 CLINICAL DATA:  Respiratory distress.  Hypertension. EXAM: PORTABLE CHEST 1 VIEW COMPARISON:  April 28, 2016 FINDINGS: There is no overall increase in patchy right lower lobe infiltrate compared to 1 day prior. Lungs elsewhere clear. Heart size and pulmonary vascularity are normal. No adenopathy. There is atherosclerotic calcification in the aorta. No bone lesions. IMPRESSION: Increase in patchy right lower lobe infiltrate consistent with pneumonia. Lungs elsewhere clear. Stable cardiac silhouette. There is aortic atherosclerosis. Followup PA and lateral chest radiographs recommended in 3-4 weeks following trial of antibiotic therapy to  ensure resolution and exclude underlying malignancy. Electronically Signed   By: Lowella Grip III M.D.   On: 04/29/2016 11:31   Dg Chest Portable 1 View  Result Date: 04/28/2016 CLINICAL DATA:  Respiratory distress EXAM: PORTABLE CHEST 1 VIEW COMPARISON:  11/24/2015 FINDINGS: Cardiac shadow is within normal limits. Aortic calcifications are again seen. The lungs are well aerated bilaterally. Patchy changes are noted in the right lung base. No sizable effusion is seen. No bony abnormality is noted. IMPRESSION: Patchy infiltrate in the right lung base. Electronically Signed   By: Inez Catalina M.D.   On: 04/28/2016 21:12    Time Spent in minutes  35   Louellen Molder M.D on 04/30/2016 at 1:01 PM  Between 7am to 7pm - Pager - [phone]  After 7pm go to [email or web address] - password [facility]  [facility] -  Office  [phone]

## 2016-04-30 NOTE — Progress Notes (Signed)
Patient off BiPAP but requiring high flow [place].  during the day went in to rapid Afib with HR in 160s-180s. , not improved with IV cardizem   drip , also low normal BP restricting further use of cardizem.  discussed with son on the phone and explained her guarded prognosis as well as issue with dysphagia an aspiration. Discussed options of feeding and he would like to discuss with pts husband about this and wants to see for any progress during the hospital stay. Started on  amiodarone drip for HR control.

## 2016-04-30 NOTE — Progress Notes (Addendum)
04/30/2016 at 1400 patient HR was 157 Dr Clementeen Graham was made aware. Patient was order one time dose Cardizem 10mg  iv and it was given at 1405. After 15 mins patient HR increase 150's to 170. Dr Clementeen Graham was made aware. Patient was placed on a Cardizem drip 1509 at 5mg /hr. It was then increase to 7.5. Called Elink Rn was told by the nurse at [facility] to increase the drip at 10mg /hr. Patient blood pressure was in the 90's. Rn was also told to increase grip at 12.5mg /hr at 1638 per RN at [facility]. Dr Clementeen Graham was made aware of Heart rate continue to increase in the 150's 180's. At 1711 patient was placed on a amiodarone drip 60mg /hr at 1711 and Cardizem drip was discontinue. Patient was on the drip for one hour, but Heart rate continue to increase in the 150's to 160's and Elink RN was made aware and Dr Clementeen Graham was notified. A order by Dr Clementeen Graham  Was given to give patient another one time dose of Cardizem 10mg  iv she received another dose at [place]. Rapid Response was called to assess the patient Heart rate 140's to 150 sustain, and rapid response notified Nurse Practitioner for night shift [facility].

## 2016-04-30 NOTE — Progress Notes (Signed)
Pharmacy Antibiotic Note  Shannon [name] is a 82 y.o. female admitted on 04/28/2016 with SOB, possible HCAP.  Pharmacy has been consulted for Vancomycin and Cefepime Dosing -Wt 53kg, SCr 0.92 (baseline ~0.4-0.6) -WBC down 9.7, still febrile with Tmax 101.57F -on BiPAP FiO2 60%  Plan: Continue Vancomycin 750 mg IV q48h Change Cefepime to 1 g IV q24h given renal function.  Vanc trough at steady state Monitor renal function  Height: 5\' 5"  (165.1 cm) Weight: 117 lb 8.1 oz (53.3 kg) IBW/kg (Calculated) : 57  Temp (24hrs), Avg:100 F (37.8 C), Min:99 F (37.2 C), Max:101.7 F (38.7 C)   Recent Labs Lab 04/28/16 2013 04/29/16 0302 04/30/16 0322  WBC 16.0* 12.9* 9.7  CREATININE 0.94 0.95 0.92    Estimated Creatinine Clearance: 34.9 mL/min (by C-G formula based on SCr of 0.92 mg/dL).    Allergies  Allergen Reactions  . Demerol Nausea And Vomiting  . Keflet [Cephalexin] Nausea And Vomiting  . Sulfa Antibiotics Nausea And Vomiting  . Tequin Nausea Only  . Penicillins Rash   Myer Peer Grayland Ormond), PharmD  PGY1 Pharmacy Resident Pager: [phone] 04/30/2016 8:13 AM

## 2016-04-30 NOTE — Progress Notes (Signed)
Speech Language Pathology Treatment: Dysphagia  Patient Details Name: Shannon [name] MRN: [id number] DOB: 03/14/1925 Today's Date: 04/30/2016 Time: 1000-1015 SLP Time Calculation (min) (ACUTE ONLY): 15 min  Assessment / Plan / Recommendation Clinical Impression  Skilled treatment session focused on dysphagia goals. SLP facilitated session by providing Mod A verbal and tactile cues for use of compensatory swallow strategies (i.e. Double swallow, cough, throat clear and deep breathing). Pt was ineffective in using strategies to prevent overt s/s of aspiration which included wet voice and immediate cough with consumption of ice chips suggestive of delayed swallow initiation and possible impact of chronic esophageal dysphagia. Pt's RR increased to 35 with trials and O2 decreased to 91% on HFNC. With Max a cues, pt able to demonstrate deep breathing and stats returned to 97% at end of session. Recommend NPO with PO trials as RR remains stable.    HPI HPI: Shannon [name] a 82 y.o.femalewith history of atrial fibrillation, diastolic CHF, hypertension, hypothyroidism was brought to the ER after patient was having progressively worsening shortness of breath last 2 days. As per patient's daughter-in-law who provided the history patient was given Lasix at least 3-4 doses over the last 24 hours despite whichpatient's shortness of breath was worsening. Since it acuity worsened yesterday evening patient was brought to the ER. Patient was placed on BiPAP. Chest x-ray was showing infiltrates and lab work showed leukocytosis and patient was febrile. Cultures were obtained and started on antibiotics for pneumonia. On my exam patient is alert awake and oriented to name and place. Follows commands and moves all extremities. Mildly short of breath while on BiPAP. on 04/29/2016 RT called to the pts bedside. RN had stated she NTS pt and the pt was desating prior to NTS. RN sxn food particles along with white frothy  sputum. RN notified MD of possible aspiration and RT findings of BBS Crackles - possible fluid. RT placed pt on 10L HFNC sats became stable. RT worked with pt on deep breathing and coughing. Pt had a strong PC small food particles and white frothy sputum. RN notified MD of pts WOB. RT placed pt on Bipap 18/6, 60% for WOB. BBS crackles. Post placing pt on bipap pt still has increased HR, RR 40s, sats 94%, BBS crackles.WOB on bipap still labored sats stable at 94%.  Of note, pt with Modified Barium Swallow Study on 11/26/2015 which revealed Severe cervical esophageal phase dysphagia and Moderate cervical esophageal phase dysphagia d/t suspected Zenker's diverticulum, poor clearnance of baium through cervical esophagus due to stasis from diverticulum and resultant backflow into pharynx/pyriform sinus WITHOUT pt sensation. Pt discharged on dysphagia 2 diet with thin liquids and strict aspiration precautions.       SLP Plan  Continue with current plan of care     Recommendations  Diet recommendations: NPO Medication Administration: Via alternative means                Oral Care Recommendations: Oral care QID Follow up Recommendations: Skilled Nursing facility Plan: Continue with current plan of care       GO             Myers Tutterow B. Rutherford Nail, M.S., CCC-SLP Speech-Language Pathologist    Dallana Mavity 04/30/2016, 10:26 AM

## 2016-05-01 DIAGNOSIS — I4891 Unspecified atrial fibrillation: Secondary | ICD-10-CM

## 2016-05-01 DIAGNOSIS — J181 Lobar pneumonia, unspecified organism: Secondary | ICD-10-CM

## 2016-05-01 LAB — APTT
APTT: 31 s (ref 24–36)
APTT: 52 s — AB (ref 24–36)

## 2016-05-01 LAB — BASIC METABOLIC PANEL
ANION GAP: 7 (ref 5–15)
BUN: 23 mg/dL — ABNORMAL HIGH (ref 6–20)
CHLORIDE: 113 mmol/L — AB (ref 101–111)
CO2: 26 mmol/L (ref 22–32)
Calcium: 10.1 mg/dL (ref 8.9–10.3)
Creatinine, Ser: 0.73 mg/dL (ref 0.44–1.00)
GFR calc non Af Amer: >60 mL/min (ref >60)
GLUCOSE: 121 mg/dL — AB (ref 65–99)
Potassium: 3.1 mmol/L — ABNORMAL LOW (ref 3.5–5.1)
Sodium: 146 mmol/L — ABNORMAL HIGH (ref 135–145)

## 2016-05-01 LAB — HEPARIN LEVEL (UNFRACTIONATED)
Heparin Unfractionated: 0.86 IU/mL — ABNORMAL HIGH (ref 0.30–0.70)
Heparin Unfractionated: 0.7 IU/mL (ref 0.30–0.70)

## 2016-05-01 LAB — CULTURE, RESPIRATORY W GRAM STAIN: Culture: NORMAL

## 2016-05-01 LAB — GLUCOSE, CAPILLARY
GLUCOSE-CAPILLARY: 114 mg/dL — AB (ref 65–99)
GLUCOSE-CAPILLARY: 94 mg/dL (ref 65–99)
GLUCOSE-CAPILLARY: 104 mg/dL — AB (ref 65–99)
Glucose-Capillary: 114 mg/dL — ABNORMAL HIGH (ref 65–99)

## 2016-05-01 LAB — MAGNESIUM: Magnesium: 2.1 mg/dL (ref 1.7–2.4)

## 2016-05-01 MED ORDER — SODIUM CHLORIDE 0.9 % IV SOLN
30.0000 meq | Freq: Once | INTRAVENOUS | Status: AC
  Administered 2016-05-01: 30 meq via INTRAVENOUS
  Filled 2016-05-01: qty 15

## 2016-05-01 MED ORDER — HEPARIN (PORCINE) IN NACL 100-0.45 UNIT/ML-% IJ SOLN
1000.0000 [IU]/h | INTRAMUSCULAR | Status: DC
  Administered 2016-05-01: 750 [IU]/h via INTRAVENOUS
  Administered 2016-05-02: 1000 [IU]/h via INTRAVENOUS
  Filled 2016-05-01 (×3): qty 250

## 2016-05-01 NOTE — Progress Notes (Signed)
Patient has converted to sinus rhythm rate 90's.Chaney Malling NP notified.Patient remains on IV amiodarone drip . Will continue to monitor.

## 2016-05-01 NOTE — NC FL2 (Signed)
[place] LEVEL OF CARE SCREENING TOOL     IDENTIFICATION  Patient Name: Shannon [name] Birthdate: 1924/10/13 Sex: female Admission Date (Current Location): 04/28/2016  [facility] and [place] Number:  Herbalist and Address:  [facility], [place] [address] [place], [place] [address]      Provider Number: [id number]  Attending Physician Name and Address:  Louellen Molder, MD  Relative Name and Phone Number:       Current Level of Care: Hospital Recommended Level of Care: [place] Prior Approval Number:    Date Approved/Denied:   PASRR Number:    Discharge Plan: Home    Current Diagnoses: Patient Active Problem List   Diagnosis Date Noted  . Acute respiratory failure with hypoxia ([place]) 04/29/2016  . Respiratory distress   . Pneumonia of right lower lobe due to infectious organism ([place]) 04/28/2016  . Pressure ulcer 11/28/2015  . At high risk for aspiration   . Hip fracture ([place]) 11/21/2015  . Dementia 11/21/2015  . Sepsis ([place]) 09/19/2015  . Hypokalemia 09/19/2015  . UTI (lower urinary tract infection) 09/19/2015  . Acute encephalopathy 09/19/2015  . HLD (hyperlipidemia)   . GERD (gastroesophageal reflux disease)   . Gastroesophageal reflux disease without esophagitis   . Closed right hip fracture ([place]) 09/24/2014  . Fracture, intertrochanteric, right femur ([place]) 09/23/2014  . Long term (current) use of anticoagulants 11/30/2012  . Cancer ([place])   . Cancer of breast, intraductal 12/01/2011  . UTI (urinary tract infection) 07/04/2011  . SDH (subdural hematoma) ([place]) 06/30/2011  . Occipital fracture ([place]) 06/30/2011  . Fall 06/30/2011  . A-fib ([place]) 06/30/2011  . Warfarin-induced coagulopathy ([place]) 06/30/2011  . Hypothyroidism 06/30/2011    Orientation RESPIRATION BLADDER Height & Weight     Self, Time, Situation, Place  O2 Continent Weight: 117 lb 8.1 oz (53.3 kg) Height:  5\' 5"  (165.1 cm)   BEHAVIORAL SYMPTOMS/MOOD NEUROLOGICAL BOWEL NUTRITION STATUS      Continent Diet  AMBULATORY STATUS COMMUNICATION OF NEEDS Skin   Extensive Assist Verbally Normal                       Personal Care Assistance Level of Assistance  Bathing, Dressing Bathing Assistance: Maximum assistance   Dressing Assistance: Maximum assistance     Functional Limitations Info             SPECIAL CARE FACTORS FREQUENCY  PT (By licensed PT), OT (By licensed OT)                    Contractures      Additional Factors Info  Code Status, Allergies, Isolation Precautions Code Status Info: DNR Allergies Info: Demerol, Keflet Cephalexin, Sulfa Antibiotics, Tequin, Penicillins     Isolation Precautions Info: MRSA     Current Medications (05/01/2016):  This is the current hospital active medication list Current Facility-Administered Medications  Medication Dose Route Frequency Provider Last Rate Last Dose  . acetaminophen (TYLENOL) tablet 650 mg  650 mg Oral Q6H PRN Rise Patience, MD       Or  . acetaminophen (TYLENOL) suppository 650 mg  650 mg Rectal Q6H PRN Rise Patience, MD      . amiodarone (NEXTERONE PREMIX) 360-4.14 MG/200ML-% (1.8 mg/mL) IV infusion  30 mg/hr Intravenous Continuous Nishant Dhungel, MD 16.7 mL/hr at 05/01/16 0957 30 mg/hr at 05/01/16 0957  . ceFEPIme (MAXIPIME) 1 g in dextrose 5 % 50  mL IVPB  1 g Intravenous Q24H Haze Boyden, RPH   1 g at 04/30/16 2158  . heparin ADULT infusion 100 units/mL (25000 units/215mL sodium chloride 0.45%)  750 Units/hr Intravenous Continuous Nishant Dhungel, MD 7.5 mL/hr at 05/01/16 1004 750 Units/hr at 05/01/16 1004  . insulin aspart (novoLOG) injection 0-9 Units  0-9 Units Subcutaneous TID WC Rise Patience, MD   1 Units at 04/30/16 1242  . MEDLINE mouth rinse  15 mL Mouth Rinse BID Nishant Dhungel, MD   15 mL at 05/01/16 0800  . ondansetron (ZOFRAN) injection 4 mg  4 mg Intravenous Q6H PRN Rise Patience, MD       . sodium chloride flush (NS) 0.9 % injection 10-40 mL  10-40 mL Intracatheter PRN Nishant Dhungel, MD   10 mL at 05/01/16 0409  . vancomycin (VANCOCIN) IVPB 750 mg/150 ml premix  750 mg Intravenous Q48H Rise Patience, MD   750 mg at 04/30/16 2229     Discharge Medications: Please see discharge summary for a list of discharge medications.  Relevant Imaging Results:  Relevant Lab Results:   Additional Information SSN: [id number]  Jorge Ny, LCSW

## 2016-05-01 NOTE — Progress Notes (Addendum)
ANTICOAGULATION CONSULT NOTE - Initial Consult  Pharmacy Consult for Heparin Indication: atrial fibrillation  Assessment: 67 yoF presents 04/28/2016 for shortness of breath/pneumonia. Prior to admission, she was anticoagulated with Eliquis for Afib. She is now at severe risk for aspiration per speech therapy, and has been made NPO. Pharmacy has been consulted for heparin dosing and monitoring until the patient can safely take PO medications again. Last dose of Eliquis was 12/26 at 0900, therefore a heparin bolus will not be given. Baseline heparin level is elevated at 0.86, baseline aPTT is 31. Since heparin level is being affected by last Eliquis dose, aPTT will be used to guide therapy until heparin level correlates. CBC stable with no bleeding noted.   Goal of Therapy:  Heparin level 0.3-0.7 units/ml aPTT 66-102 seconds Monitor platelets by anticoagulation protocol: Yes   Plan:  Start heparin infusion at 750 units/hr Check anti-Xa level in 8 hours and daily while on heparin Continue to monitor H&H and platelets   Allergies  Allergen Reactions  . Demerol Nausea And Vomiting  . Keflet [Cephalexin] Nausea And Vomiting  . Sulfa Antibiotics Nausea And Vomiting  . Tequin Nausea Only  . Penicillins Rash    Patient Measurements: Height: 5\' 5"  (165.1 cm) Weight: 117 lb 8.1 oz (53.3 kg) IBW/kg (Calculated) : 57 Heparin Dosing Weight: 53.3 kg  Vital Signs: Temp: 98.4 F (36.9 C) (12/28 0821) Temp Source: Oral (12/28 0821) BP: 162/92 (12/28 0821) Pulse Rate: 75 (12/28 0500)  Labs:  Recent Labs  04/28/16 2013 04/29/16 0302 04/29/16 0916 04/30/16 0322 05/01/16 0242  HGB 14.0 13.2  --  12.6  --   HCT 41.4 38.9  --  39.1  --   PLT 307 280  --  265  --   CREATININE 0.94 0.95  --  0.92 0.73  TROPONINI  --   --  <0.03  --   --     Estimated Creatinine Clearance: 40.1 mL/min (by C-G formula based on SCr of 0.73 mg/dL).   Medical History: Past Medical History:  Diagnosis  Date  . A-fib ([place])   . Cancer ([place])    left breast  . Constipation   . Dementia   . GERD (gastroesophageal reflux disease)   . GERD (gastroesophageal reflux disease)   . HLD (hyperlipidemia)   . Hx of radiation therapy 2001   left breast  . Hypertension   . Hypothyroidism     Medications:  Infusions:  . amiodarone 30 mg/hr (04/30/16 2338)  . dextrose 50 mL/hr at 04/30/16 1512  . heparin      Belia Heman, PharmD PGY1 Pharmacy Resident [phone] (Pager) 05/01/2016 9:25 AM

## 2016-05-01 NOTE — Progress Notes (Signed)
Speech Language Pathology Treatment: Dysphagia  Patient Details Name: Shannon [name] MRN: [id number] DOB: 1925-01-15 Today's Date: 05/01/2016 Time: 1345-1400 SLP Time Calculation (min) (ACUTE ONLY): 15 min  Assessment / Plan / Recommendation Clinical Impression  Patient alert and cooperative, confused. Able to consume po trials today at bedside without overt s/s of aspiration, moderate assistance provided for consumption via small controlled sips to decrease aspiration risk. Given h/o dysphagia, deconditioning, and respiratory dysfunction, instrumental testing recommended prior to initiation of a po diet. Education complete with patient and daughter in law. Will proceed with MBS next date.     HPI HPI: GERALDY ANGELOS a 82 y.o.femalewith history of atrial fibrillation, diastolic CHF, hypertension, hypothyroidism was brought to the ER after patient was having progressively worsening shortness of breath last 2 days. As per patient's daughter-in-law who provided the history patient was given Lasix at least 3-4 doses over the last 24 hours despite whichpatient's shortness of breath was worsening. Since it acuity worsened yesterday evening patient was brought to the ER. Patient was placed on BiPAP. Chest x-ray was showing infiltrates and lab work showed leukocytosis and patient was febrile. Cultures were obtained and started on antibiotics for pneumonia. On my exam patient is alert awake and oriented to name and place. Follows commands and moves all extremities. Mildly short of breath while on BiPAP. on 04/29/2016 RT called to the pts bedside. RN had stated she NTS pt and the pt was desating prior to NTS. RN sxn food particles along with white frothy sputum. RN notified MD of possible aspiration and RT findings of BBS Crackles - possible fluid. RT placed pt on 10L HFNC sats became stable. RT worked with pt on deep breathing and coughing. Pt had a strong PC small food particles and white frothy sputum. RN  notified MD of pts WOB. RT placed pt on Bipap 18/6, 60% for WOB. BBS crackles. Post placing pt on bipap pt still has increased HR, RR 40s, sats 94%, BBS crackles.WOB on bipap still labored sats stable at 94%.  Of note, pt with Modified Barium Swallow Study on 11/26/2015 which revealed Severe cervical esophageal phase dysphagia and Moderate cervical esophageal phase dysphagia d/t suspected Zenker's diverticulum, poor clearnance of baium through cervical esophagus due to stasis from diverticulum and resultant backflow into pharynx/pyriform sinus WITHOUT pt sensation. Pt discharged on dysphagia 2 diet with thin liquids and strict aspiration precautions.       SLP Plan  MBS     Recommendations  Diet recommendations: NPO Medication Administration: Via alternative means                Oral Care Recommendations: Oral care QID Follow up Recommendations: Skilled Nursing facility Plan: [facility] [place], [place] [phone]   [place] 05/01/2016, 2:14 PM

## 2016-05-01 NOTE — Progress Notes (Signed)
Patient stable on 10L high flow at this time. BIPAP not placed on at this time. RT will continue to monitor.

## 2016-05-01 NOTE — Progress Notes (Signed)
PROGRESS NOTE                                                                                                                                                                                                             Patient Demographics:    Shannon [name], is a 82 y.o. female, DOB - 19-Jan-1925, [id number]  Admit date - 04/28/2016   Admitting Physician Rise Patience, MD  Outpatient Primary MD for the patient is Shannon Screws, MD  LOS - 3  Outpatient Specialists:None  Chief Complaint  Patient presents with  . Respiratory Distress       Brief Narrative   82 year old female with A. fib on anticoagulation, diastolic CHF, hypertension, hypothyroidism history of cervical dysphagia brought to the ED with progressive shortness of breath for past 2 days patient's family give extra doses of Lasix without much improvement in her dyspnea. In the ED she was in acute hypoxic respiratory failure requiring BiPAP. Chest x-ray showing infiltrates suggestive of pneumonia. She had leukocytosis on her blood work. Blood cultures sent and started on empiric antibiotics and admitted to stepdown unit on BiPAP. Hospital course complicated with ongoing resp failure, failed swallow eval and afib with RVR.   Subjective:   Went in to rapid afib, not improved with IV cardizem push and drip, then placed on amiodarone drip. Also required a dose of IV digoxin. Pt now in sinus and BP better.still requiring high flow 02 ( 12L)   Assessment  & Plan :    Principal Problem:   Acute respiratory failure with hypoxia (HCC) Suspect secondary to aspiration pneumonia. Continue empiric vancomycin and cefepime. Blood cultures so far negative. Flu PCR negative. -Off BiPAP and Initially required high flow nasal cannula.. -Continue step down monitoring.   Active Problems: Aspiration pneumonia Patient has had barium swallow done in July this year  showing severe cervical esophageal phase dysphagia and moderate cervical phase dysphagia due to suspected Zenker's diverticulum . She was discharged on dysphagia level II diet with thin liquid and strict aspiration precautions. -Seen by swallow eval nurse this morning after patient off BiPAP and recommended to keep NPO for now.    A-fib with RVR ([place]) Patient off eliquis oral Cardizem being nothing by mouth. Went into RVR on 12/27 not responding to IV Cardizem push or drip (had a low blood pressure as  well). Started on amiodarone drip with heart rates still sustained in 140s-150s. Given her dose of IV diltiazem after which heart rate has remained stable. -Started on IV heparin.    Hypothyroidism Hold home meds  Protein Calorie malnutrition. Hold supplements while nothing by mouth.  Hypokalemia Replenished. Normal magnesium.  Diabetes mellitus type 2 Monitor on sliding scale coverage.  Hypernatremia Continue D5W.   Discussed goals of care at length with son at bedside. He understands patient's guarded prognosis with ongoing respiratory failure, rapid A. fib and high aspiration risk. Agrees with having further discussion with palliative care. Patient undecided about artificial means feeding at this time.    Code Status : DO NOT RESUSCITATE  Family Communication  : Son at bedside  Disposition Plan  : Continue step down monitoring  Barriers For Discharge : Active symptoms  Consults  :   Palliative care  Procedures  : None  DVT Prophylaxis  :  IV heparin  Lab Results  Component Value Date   PLT 265 04/30/2016    Antibiotics  :    Anti-infectives    Start     Dose/Rate Route Frequency Ordered Stop   04/30/16 2200  vancomycin (VANCOCIN) IVPB 750 mg/150 ml premix     750 mg 150 mL/hr over 60 Minutes Intravenous Every 48 hours 04/29/16 0626     04/30/16 2156  ceFEPIme (MAXIPIME) 1 g in dextrose 5 % 50 mL IVPB     1 g 100 mL/hr over 30 Minutes Intravenous Every 24 hours  04/30/16 0809     04/29/16 2200  azithromycin (ZITHROMAX) 500 mg in dextrose 5 % 250 mL IVPB  Status:  Discontinued     500 mg 250 mL/hr over 60 Minutes Intravenous Every 24 hours 04/29/16 0557 04/30/16 1314   04/29/16 0800  ceFEPIme (MAXIPIME) 1 g in dextrose 5 % 50 mL IVPB  Status:  Discontinued     1 g 100 mL/hr over 30 Minutes Intravenous Every 12 hours 04/29/16 0626 04/30/16 0809   04/28/16 2230  vancomycin (VANCOCIN) IVPB 1000 mg/200 mL premix     1,000 mg 200 mL/hr over 60 Minutes Intravenous  Once 04/28/16 2218 04/29/16 0041   04/28/16 2215  cefTRIAXone (ROCEPHIN) 1 g in dextrose 5 % 50 mL IVPB     1 g 100 mL/hr over 30 Minutes Intravenous  Once 04/28/16 2204 04/28/16 2319   04/28/16 2215  azithromycin (ZITHROMAX) 500 mg in dextrose 5 % 250 mL IVPB     500 mg 250 mL/hr over 60 Minutes Intravenous  Once 04/28/16 2204 04/29/16 0104        Objective:   Vitals:   05/01/16 0430 05/01/16 0500 05/01/16 0810 05/01/16 0821  BP: 131/71 140/68  (!) 162/92  Pulse: 75 75    Resp: (!) 25 (!) 24    Temp:    98.4 F (36.9 C)  TempSrc:    Oral  SpO2: 98% 99% 98%   Weight:      Height:        Wt Readings from Last 3 Encounters:  04/29/16 53.3 kg (117 lb 8.1 oz)  11/21/15 70.3 kg (155 lb)  09/22/15 69.4 kg (153 lb)     Intake/Output Summary (Last 24 hours) at 05/01/16 1003 Last data filed at 05/01/16 0409  Gross per 24 hour  Intake               10 ml  Output  0 ml  Net               10 ml     Physical Exam  Gen: Appears fatigued, not in distress HEENT: On high flow nasal cannula, using accessory muscles of respiration, moist mucosa Chest: Coarse breath sounds bilaterally CVS: S1 and S2 irregular, no murmurs rub or gallop GI: soft, NT, ND, BS+ Musculoskeletal: warm, no edema     Data Review:    CBC  Recent Labs Lab 04/28/16 2013 04/29/16 0302 04/30/16 0322  WBC 16.0* 12.9* 9.7  HGB 14.0 13.2 12.6  HCT 41.4 38.9 39.1  PLT 307 280 265  MCV  94.1 93.3 96.1  MCH 31.8 31.7 31.0  MCHC 33.8 33.9 32.2  RDW 14.6 14.5 14.7  LYMPHSABS 0.8  --   --   MONOABS 1.1*  --   --   EOSABS 0.0  --   --   BASOSABS 0.0  --   --     Chemistries   Recent Labs Lab 04/28/16 2013 04/29/16 0302 04/29/16 0916 04/30/16 0322 05/01/16 0242  NA 142 141  --  149* 146*  K 2.9* 2.7*  --  4.1 3.1*  CL 101 100*  --  113* 113*  CO2 28 27  --  23 26  GLUCOSE 206* 200*  --  149* 121*  BUN 14 15  --  22* 23*  CREATININE 0.94 0.95  --  0.92 0.73  CALCIUM 10.7* 10.6*  --  10.9* 10.1  MG  --   --  1.7  --  2.1  AST 23  --   --   --   --   ALT 17  --   --   --   --   ALKPHOS 78  --   --   --   --   BILITOT 0.8  --   --   --   --    ------------------------------------------------------------------------------------------------------------------ No results for input(s): CHOL, HDL, LDLCALC, TRIG, CHOLHDL, LDLDIRECT in the last 72 hours.  Lab Results  Component Value Date   HGBA1C 5.6 04/29/2016   ------------------------------------------------------------------------------------------------------------------  Recent Labs  04/29/16 0916  TSH 5.094*   ------------------------------------------------------------------------------------------------------------------ No results for input(s): VITAMINB12, FOLATE, FERRITIN, TIBC, IRON, RETICCTPCT in the last 72 hours.  Coagulation profile No results for input(s): INR, PROTIME in the last 168 hours.  No results for input(s): DDIMER in the last 72 hours.  Cardiac Enzymes  Recent Labs Lab 04/29/16 0916  TROPONINI <0.03   ------------------------------------------------------------------------------------------------------------------    Component Value Date/Time   BNP 42.5 04/28/2016 2013    Inpatient Medications  Scheduled Meds: . ceFEPime (MAXIPIME) IV  1 g Intravenous Q24H  . insulin aspart  0-9 Units Subcutaneous TID WC  . mouth rinse  15 mL Mouth Rinse BID  . potassium chloride  (KCL MULTIRUN) 30 mEq in 265 mL IVPB  30 mEq Intravenous Once  . vancomycin  750 mg Intravenous Q48H   Continuous Infusions: . amiodarone 30 mg/hr (05/01/16 0957)  . dextrose 50 mL/hr at 04/30/16 1512  . heparin     PRN Meds:.acetaminophen **OR** acetaminophen, [DISCONTINUED] ondansetron **OR** ondansetron (ZOFRAN) IV, sodium chloride flush  Micro Results Recent Results (from the past 240 hour(s))  Blood culture (routine x 2)     Status: None (Preliminary result)   Collection Time: 04/28/16  8:15 PM  Result Value Ref Range Status   Specimen Description BLOOD RIGHT ARM  Final   Special Requests BOTTLES DRAWN AEROBIC AND ANAEROBIC  5ML  Final   Culture NO GROWTH 1 DAY  Final   Report Status PENDING  Incomplete  Blood culture (routine x 2)     Status: None (Preliminary result)   Collection Time: 04/29/16 12:42 AM  Result Value Ref Range Status   Specimen Description BLOOD RIGHT HAND  Final   Special Requests IN PEDIATRIC BOTTLE 4ML  Final   Culture NO GROWTH 1 DAY  Final   Report Status PENDING  Incomplete  Culture, sputum-assessment     Status: None   Collection Time: 04/29/16  2:12 AM  Result Value Ref Range Status   Specimen Description EXPECTORATED SPUTUM  Final   Special Requests NONE  Final   Sputum evaluation THIS SPECIMEN IS ACCEPTABLE FOR SPUTUM CULTURE  Final   Report Status 04/29/2016 FINAL  Final  Culture, respiratory (NON-Expectorated)     Status: None (Preliminary result)   Collection Time: 04/29/16  2:12 AM  Result Value Ref Range Status   Specimen Description EXPECTORATED SPUTUM  Final   Special Requests NONE Reflexed from [id number]  Final   Gram Stain   Final    DEGENERATED CELLULAR MATERIAL PRESENT RARE SQUAMOUS EPITHELIAL CELLS PRESENT MODERATE GRAM POSITIVE COCCI IN PAIRS IN CLUSTERS FEW GRAM NEGATIVE RODS RARE GRAM POSITIVE RODS    Culture CULTURE REINCUBATED FOR BETTER GROWTH  Final   Report Status PENDING  Incomplete  MRSA PCR Screening     Status: None     Collection Time: 04/29/16  4:59 AM  Result Value Ref Range Status   MRSA by PCR NEGATIVE NEGATIVE Final    Comment:        The GeneXpert MRSA Assay (FDA approved for NASAL specimens only), is one component of a comprehensive MRSA colonization surveillance program. It is not intended to diagnose MRSA infection nor to guide or monitor treatment for MRSA infections.   Urine culture     Status: None   Collection Time: 04/29/16  4:42 PM  Result Value Ref Range Status   Specimen Description URINE, RANDOM  Final   Special Requests NONE  Final   Culture NO GROWTH  Final   Report Status 04/30/2016 FINAL  Final    Radiology Reports Dg Chest Port 1 View  Result Date: 04/29/2016 CLINICAL DATA:  Respiratory distress.  Hypertension. EXAM: PORTABLE CHEST 1 VIEW COMPARISON:  April 28, 2016 FINDINGS: There is no overall increase in patchy right lower lobe infiltrate compared to 1 day prior. Lungs elsewhere clear. Heart size and pulmonary vascularity are normal. No adenopathy. There is atherosclerotic calcification in the aorta. No bone lesions. IMPRESSION: Increase in patchy right lower lobe infiltrate consistent with pneumonia. Lungs elsewhere clear. Stable cardiac silhouette. There is aortic atherosclerosis. Followup PA and lateral chest radiographs recommended in 3-4 weeks following trial of antibiotic therapy to ensure resolution and exclude underlying malignancy. Electronically Signed   By: Lowella Grip III M.D.   On: 04/29/2016 11:31   Dg Chest Portable 1 View  Result Date: 04/28/2016 CLINICAL DATA:  Respiratory distress EXAM: PORTABLE CHEST 1 VIEW COMPARISON:  11/24/2015 FINDINGS: Cardiac shadow is within normal limits. Aortic calcifications are again seen. The lungs are well aerated bilaterally. Patchy changes are noted in the right lung base. No sizable effusion is seen. No bony abnormality is noted. IMPRESSION: Patchy infiltrate in the right lung base. Electronically Signed   By:  Inez Catalina M.D.   On: 04/28/2016 21:12    Time Spent in minutes  35   Louellen Molder M.D on  05/01/2016 at 10:03 AM  Between 7am to 7pm - Pager - [phone]  After 7pm go to [email or web address] - password [facility]  [facility] -  Office  [phone]

## 2016-05-01 NOTE — Progress Notes (Signed)
ANTICOAGULATION CONSULT NOTE - Follow Up Consult  Pharmacy Consult for Heparin Indication: atrial fibrillation  Allergies  Allergen Reactions  . Demerol Nausea And Vomiting  . Keflet [Cephalexin] Nausea And Vomiting  . Sulfa Antibiotics Nausea And Vomiting  . Tequin Nausea Only  . Penicillins Rash    Patient Measurements: Height: 5\' 5"  (165.1 cm) Weight: 117 lb 8.1 oz (53.3 kg) IBW/kg (Calculated) : 57 Heparin Dosing Weight: 53 kg  Vital Signs: Temp: 99 F (37.2 C) (12/28 2013) Temp Source: Oral (12/28 2013) BP: 148/86 (12/28 2013) Pulse Rate: 82 (12/28 2013)  Labs:  Recent Labs  04/29/16 0302 04/29/16 0916 04/30/16 0322 05/01/16 0242 05/01/16 1124 05/01/16 1142 05/01/16 1852  HGB 13.2  --  12.6  --   --   --   --   HCT 38.9  --  39.1  --   --   --   --   PLT 280  --  265  --   --   --   --   APTT  --   --   --   --   --  31 52*  HEPARINUNFRC  --   --   --   --  0.86*  --  0.70  CREATININE 0.95  --  0.92 0.73  --   --   --   TROPONINI  --  <0.03  --   --   --   --   --     Estimated Creatinine Clearance: 40.1 mL/min (by C-G formula based on SCr of 0.73 mg/dL).   Medications:  Heparin @ 750 units/hr (7.5 ml/hr)  Assessment: 89 YOF who presented on 12/25 with SOB and r/o PNA. Prior to admission, the patient was anticoagulated with Eliquis for Afib. She is now at severe risk for aspiration per speech therapy, and has been made NPO. Pharmacy has been consulted for heparin dosing and monitoring until the patient can safely take PO medications again. Last dose of Eliquis was 12/26 at 0900  aPTT is SUBtherapeutic this evening (aPTT 52, goal of 66-102 seconds) and the Heparin level remains falsely elevated at 0.7. No overt s/sx of bleeding or issues with the heparin drip are noted. Will increase the drip rate and recheck an 8hr heparin level.   Goal of Therapy:  Heparin level 0.3-0.7 units/ml aPTT 66-102 seconds Monitor platelets by anticoagulation protocol: Yes   Plan:  1. Increase Heparin to 850 units/hr (8.5 ml/hr) 2. Will continue to monitor for any signs/symptoms of bleeding and will follow up with aPTT and heparin level in 8 hours   Thank you for allowing pharmacy to be a part of this patient's care.  Alycia Rossetti, PharmD, BCPS Clinical Pharmacist Pager: [phone] 05/01/2016 8:30 PM

## 2016-05-02 ENCOUNTER — Inpatient Hospital Stay (HOSPITAL_COMMUNITY): Payer: Medicare Other

## 2016-05-02 DIAGNOSIS — Z515 Encounter for palliative care: Secondary | ICD-10-CM

## 2016-05-02 DIAGNOSIS — Z7189 Other specified counseling: Secondary | ICD-10-CM

## 2016-05-02 DIAGNOSIS — R0602 Shortness of breath: Secondary | ICD-10-CM

## 2016-05-02 DIAGNOSIS — R0600 Dyspnea, unspecified: Secondary | ICD-10-CM

## 2016-05-02 DIAGNOSIS — I480 Paroxysmal atrial fibrillation: Secondary | ICD-10-CM

## 2016-05-02 DIAGNOSIS — F0390 Unspecified dementia without behavioral disturbance: Secondary | ICD-10-CM

## 2016-05-02 LAB — GLUCOSE, CAPILLARY
GLUCOSE-CAPILLARY: 107 mg/dL — AB (ref 65–99)
GLUCOSE-CAPILLARY: 90 mg/dL (ref 65–99)
GLUCOSE-CAPILLARY: 77 mg/dL (ref 65–99)
Glucose-Capillary: 90 mg/dL (ref 65–99)

## 2016-05-02 LAB — BASIC METABOLIC PANEL
Anion gap: 9 (ref 5–15)
BUN: 16 mg/dL (ref 6–20)
CHLORIDE: 115 mmol/L — AB (ref 101–111)
CO2: 25 mmol/L (ref 22–32)
Calcium: 9.2 mg/dL (ref 8.9–10.3)
Creatinine, Ser: 0.62 mg/dL (ref 0.44–1.00)
GFR calc non Af Amer: >60 mL/min (ref >60)
Glucose, Bld: 117 mg/dL — ABNORMAL HIGH (ref 65–99)
POTASSIUM: 3.6 mmol/L (ref 3.5–5.1)
SODIUM: 149 mmol/L — AB (ref 135–145)

## 2016-05-02 LAB — APTT: APTT: 55 s — AB (ref 24–36)

## 2016-05-02 LAB — HEPARIN LEVEL (UNFRACTIONATED): HEPARIN UNFRACTIONATED: 0.63 [IU]/mL (ref 0.30–0.70)

## 2016-05-02 MED ORDER — ONDANSETRON HCL 4 MG/2ML IJ SOLN: 4.0000 mg | Freq: Four times a day (QID) | INTRAMUSCULAR | Status: DC | PRN

## 2016-05-02 MED ORDER — ONDANSETRON 4 MG PO TBDP
4.0000 mg | ORAL_TABLET | Freq: Four times a day (QID) | ORAL | Status: DC | PRN
  Filled 2016-05-02: qty 1

## 2016-05-02 MED ORDER — GLYCOPYRROLATE 1 MG PO TABS: 1.0000 mg | ORAL_TABLET | ORAL | Status: DC | PRN

## 2016-05-02 MED ORDER — LORAZEPAM 2 MG/ML PO CONC: 1.0000 mg | ORAL | Status: DC | PRN

## 2016-05-02 MED ORDER — MORPHINE SULFATE (CONCENTRATE) 10 MG/0.5ML PO SOLN: 5.0000 mg | ORAL | Status: DC | PRN

## 2016-05-02 MED ORDER — POLYVINYL ALCOHOL 1.4 % OP SOLN
1.0000 [drp] | Freq: Four times a day (QID) | OPHTHALMIC | Status: DC | PRN
  Filled 2016-05-02: qty 15

## 2016-05-02 MED ORDER — ACETAMINOPHEN 325 MG PO TABS: 650.0000 mg | ORAL_TABLET | Freq: Four times a day (QID) | ORAL | Status: DC | PRN

## 2016-05-02 MED ORDER — GLYCOPYRROLATE 0.2 MG/ML IJ SOLN: 0.2000 mg | INTRAMUSCULAR | Status: DC | PRN

## 2016-05-02 MED ORDER — BIOTENE DRY MOUTH MT LIQD: 15.0000 mL | OROMUCOSAL | Status: DC | PRN

## 2016-05-02 MED ORDER — LEVOTHYROXINE SODIUM 100 MCG IV SOLR
50.0000 ug | Freq: Every day | INTRAVENOUS | Status: DC
  Administered 2016-05-02: 50 ug via INTRAVENOUS
  Filled 2016-05-02: qty 5

## 2016-05-02 MED ORDER — DIPHENHYDRAMINE HCL 50 MG/ML IJ SOLN: 12.5000 mg | INTRAMUSCULAR | Status: DC | PRN

## 2016-05-02 MED ORDER — HALOPERIDOL LACTATE 2 MG/ML PO CONC
0.5000 mg | ORAL | Status: DC | PRN
  Filled 2016-05-02: qty 0.3

## 2016-05-02 MED ORDER — MORPHINE SULFATE (PF) 2 MG/ML IV SOLN
1.0000 mg | INTRAVENOUS | Status: AC
  Administered 2016-05-02: 1 mg via INTRAVENOUS
  Filled 2016-05-02: qty 1

## 2016-05-02 MED ORDER — ACETAMINOPHEN 650 MG RE SUPP: 650.0000 mg | Freq: Four times a day (QID) | RECTAL | Status: DC | PRN

## 2016-05-02 MED ORDER — LORAZEPAM 1 MG PO TABS: 1.0000 mg | ORAL_TABLET | ORAL | Status: DC | PRN

## 2016-05-02 MED ORDER — HALOPERIDOL 0.5 MG PO TABS
0.5000 mg | ORAL_TABLET | ORAL | Status: DC | PRN
  Filled 2016-05-02: qty 1

## 2016-05-02 MED ORDER — LORAZEPAM 2 MG/ML IJ SOLN: 1.0000 mg | INTRAMUSCULAR | Status: DC | PRN

## 2016-05-02 MED ORDER — GLYCOPYRROLATE 0.2 MG/ML IJ SOLN
0.1000 mg | INTRAMUSCULAR | Status: AC
  Administered 2016-05-02: 0.1 mg via INTRAVENOUS
  Filled 2016-05-02: qty 1

## 2016-05-02 MED ORDER — HALOPERIDOL LACTATE 5 MG/ML IJ SOLN: 0.5000 mg | INTRAMUSCULAR | Status: DC | PRN

## 2016-05-02 NOTE — Progress Notes (Signed)
CSW went to speak with pt/family regarding plan- dtr in law at bedside reports that they have a family meeting tonight with Palliative team and that she believes patient will need residential hospice (preference for [facility])  Please reconsult CSW team for hospice placement if that is what the family decides  Jorge Ny, [place] Social Worker [phone]

## 2016-05-02 NOTE — Plan of Care (Signed)
Problem: Discharge Progression Outcomes Goal: Other Discharge Outcomes/Goals Outcome: Progressing pallative care - home hospice ( decision confirmed by pt and family)

## 2016-05-02 NOTE — Consult Note (Signed)
Consultation Note Date: 05/02/2016   Patient Name: Shannon [name]  DOB: [birth year]  MRN: [id number]  Age / Sex: 82 y.o., female  PCP: Josetta Huddle, MD Referring Physician: Louellen Molder, MD  Reason for Consultation: Establishing goals of care  HPI/Patient Profile: 82 y.o. female  with past medical history of a-fib, CHF, HTN, dysphagia with Zenker's diverticulum, hypothyroidsim admitted on 04/28/2016 with progressively worsening SOB. Workup revealed acute respiratory failure secondary to pneumonia has intermittently been on bipap currently requiring high flow [place]. Barium swallow eval revealed ongoing dysphagia with aspiration and she was made NPO. She has had a-fib with RVR that did not respond to cardizem and was started on IV heparin and admiodarone. Palliative consulted for Shannon.  Clinical Assessment and Goals of Care: Met with patient's two sons and daughter in law. They understand that patient has compromised respiratory status in setting of irreversible dysphagia and that aspiration and recurrence of pneumonia is likely. Family has noted patient has been declining rapidly since experiencing her first of two hip fractures this year. Since residing in the nursing facility she has had decrease in quality of life. Is not eating, and her cognitive function has changed. They feel that she has been on a downward trajectory for quite sometime and their main goal is to ensure her comfort. Prior to admission patient was living in a nursing facility recovering from a hip fracture. Her husband was living at their home, he has some dementia. Discussed patient's current medical status and illness trajectory. Family understands patient has poor prognosis for regaining level of function prior to admission. Discussed dysphagia and comfort feeding vs. Feeding tube. Family does not desire feeding tube. Family desires full comfort  measures and requesting residential hospice if available. They have requested a second meeting with palliative medicine this evening to discuss this with patient's spouse prior to making transition to comfort care. They request assistance due to patient's spouse dementia and poor decision making abilities.  Met again in the evening with patient's spouse present. He also would like comfort care and possible transfer to residential hospice. They have been married for 69 years. Gave emotional support and encouraged reminiscent therapy.   Primary Decision Maker NEXT OF KIN - patient's children    SUMMARY OF RECOMMENDATIONS:  -DNR - SOB:   -Morphine intensol (32m/0.5mL) 557mSL q1hr prn SOB   -Keep room cool --Anxiety/Agitation:  -lorazepam 33m49mL or IV q 4 hours prn  -haloperidol .5mg72m, IV, SL q 4hrs prn --Pulmonary edema/Audible Terminal Secretions:  -1 mg po or .2mg 633mor SL q 4 hrs prn --Stop IV fluids --Stop all meds that do not focus on comfort --Consult to SW for residential hospice placement -Family requesting comfort feeding and comfort measures  Code Status/Advance Care Planning:  DNR   Palliative Prophylaxis:   Delirium Protocol and Frequent Pain Assessment  Additional Recommendations (Limitations, Scope, Preferences):  Full Comfort Care  Psycho-social/Spiritual:   Desire for further Chaplaincy support:No  Additional Recommendations: Education on Hospice  Prognosis:    <  2 weeks d/t patient with pneumonia, afib w/RVR, dysphagia, transition to comfort measures only  Discharge Planning: Hospice facility  Primary Diagnoses: Present on Admission: . Pneumonia of right lower lobe due to infectious organism ([place]) . Acute respiratory failure with hypoxia ([place]) . HLD (hyperlipidemia) . Dementia . A-fib ([place]) . Hypothyroidism   I have reviewed the medical record, interviewed the patient and family, and examined the patient. The following aspects are  pertinent.  Past Medical History:  Diagnosis Date  . A-fib ([place])   . Cancer ([place])    left breast  . Constipation   . Dementia   . GERD (gastroesophageal reflux disease)   . GERD (gastroesophageal reflux disease)   . HLD (hyperlipidemia)   . Hx of radiation therapy 2001   left breast  . Hypertension   . Hypothyroidism    Social History   Social History  . Marital status: Married    Spouse name: N/A  . Number of children: N/A  . Years of education: N/A   Social History Main Topics  . Smoking status: Former Smoker    Quit date: 05/05/1968  . Smokeless tobacco: Never Used  . Alcohol use No  . Drug use: No  . Sexual activity: Not Asked   Other Topics Concern  . None   Social History Narrative  . None   Family History  Problem Relation Age of Onset  . Hypertension Other    Scheduled Meds: . ceFEPime (MAXIPIME) IV  1 g Intravenous Q24H  . insulin aspart  0-9 Units Subcutaneous TID WC  . levothyroxine  50 mcg Intravenous Daily  . mouth rinse  15 mL Mouth Rinse BID   Continuous Infusions: . amiodarone 30 mg/hr (05/02/16 1007)  . heparin 1,000 Units/hr (05/02/16 1007)   PRN Meds:.acetaminophen **OR** acetaminophen, [DISCONTINUED] ondansetron **OR** ondansetron (ZOFRAN) IV, sodium chloride flush Medications Prior to Admission:  Prior to Admission medications   Medication Sig Start Date End Date Taking? Authorizing Provider  apixaban (ELIQUIS) 2.5 MG TABS tablet Take 2.5 mg by mouth 2 (two) times daily.   Yes Historical Provider, MD  atorvastatin (LIPITOR) 10 MG tablet Take 10 mg by mouth at bedtime.    Yes Historical Provider, MD  Cholecalciferol (VITAMIN D) 2000 UNITS tablet Take 2,000 Units by mouth daily.   Yes Historical Provider, MD  diltiazem (CARDIZEM CD) 180 MG 24 hr capsule Take 180 mg by mouth daily.  11/10/12  Yes Historical Provider, MD  furosemide (LASIX) 40 MG tablet Take 40 mg by mouth 2 (two) times daily.   Yes Historical Provider, MD  levothyroxine  (SYNTHROID, LEVOTHROID) 137 MCG tablet Take 137 mcg by mouth daily before breakfast.   Yes Historical Provider, MD  MYRBETRIQ 50 MG TB24 Take 50 mg by mouth daily.  11/10/12  Yes Historical Provider, MD  OVER THE COUNTER MEDICATION Take 60 mLs by mouth 3 (three) times daily. Med pass   Yes Historical Provider, MD  pantoprazole (PROTONIX) 40 MG tablet Take 40 mg by mouth daily.   Yes Historical Provider, MD  polyethylene glycol (MIRALAX / GLYCOLAX) packet Take 17 g by mouth daily. 09/26/14  Yes Shanker Kristeen Mans, MD  propafenone (RYTHMOL) 150 MG tablet Take 150 mg by mouth 3 (three) times daily.   Yes Historical Provider, MD  sertraline (ZOLOFT) 25 MG tablet Take 25 mg by mouth daily.  09/17/15  Yes Historical Provider, MD  cefUROXime (CEFTIN) 250 MG tablet Take 250 mg by mouth 2 (two) times daily with a meal.  Historical Provider, MD  ciprofloxacin (CIPRO) 250 MG tablet Take 250 mg by mouth 2 (two) times daily.    Historical Provider, MD   Allergies  Allergen Reactions  . Demerol Nausea And Vomiting  . Keflet [Cephalexin] Nausea And Vomiting  . Sulfa Antibiotics Nausea And Vomiting  . Tequin Nausea Only  . Penicillins Rash   Review of Systems  Unable to perform ROS: Mental status change    Physical Exam  Constitutional:  Thin, frail  Cardiovascular: Normal rate and regular rhythm.   Pulmonary/Chest: She has rales.  SOB  Abdominal: Soft. Bowel sounds are normal.  Musculoskeletal: She exhibits edema.  Neurological:  Confused  Skin: Skin is warm and dry. There is pallor.  R forearm edema and erythema at IV site  Psychiatric: She has a normal mood and affect. Her behavior is normal.  Nursing note and vitals reviewed.   Vital Signs: BP (!) 160/87 (BP Location: Left Arm)   Pulse 76   Temp 98.1 F (36.7 C) (Oral)   Resp (!) 23   Ht _0  (1.651 m)   Wt 53.3 kg (117 lb 8.1 oz)   SpO2 97%   BMI 19.55 kg/m  Pain Assessment: No/denies pain   Pain Score: 0-No pain   SpO2: SpO2:  97 % O2 Device:SpO2: 97 % O2 Flow Rate: .O2 Flow Rate (L/min): 10 L/min  IO: Intake/output summary:  Intake/Output Summary (Last 24 hours) at 05/02/16 1355 Last data filed at 05/02/16 0600  Gross per 24 hour  Intake           520.35 ml  Output                0 ml  Net           520.35 ml    LBM: Last BM Date: 05/01/16 Baseline Weight: Weight: 70.3 kg (155 lb) Most recent weight: Weight: 53.3 kg (117 lb 8.1 oz)     Palliative Assessment/Data: PPS: 10%     Thank you for this consult. Palliative medicine will continue to follow and assist as needed.   Time In: 1400 Time Out: 1500 Time In: 1900 Time Out: 2045 Time Total: 165 minutes  Greater than 50%  of this time was spent counseling and coordinating care related to the above assessment and plan.  Signed by: Mariana Kaufman, AGNP-C Palliative Medicine    Please contact Palliative Medicine Team phone at [phone] for questions and concerns.  For individual provider: See Shea Evans

## 2016-05-02 NOTE — Progress Notes (Signed)
Per Shannon [name] -NP , pt  Status has changed to comfort care ans she will be going to [facility] . She ordered 1mg  of morphine now - due to pt  increased work of breathing. Pt is now on bipap but will resume hfnc once resp staus stabilzes . Morphine adminsitered and pt is resting with family at bedsisde . She does not appear to be in any distress at this time . Will continue to monitor

## 2016-05-02 NOTE — Progress Notes (Signed)
Pharmacy Antibiotic Note  Shannon [name] is a 82 y.o. female admitted on 04/28/2016 with SOB, possible HCAP.  Pharmacy has been consulted for Vancomycin and Cefepime Dosing. Per SLP note, patient at severe risk of aspiration and remains NPO.  Today is D#5 ABX on vanc and cefepime.  -Wt 53kg, SCr down to 0.62 (baseline ~0.4-0.6) - CrCl ~30-35 assuming SCr no less than 1 -WBC down 9.7 as of last CBC, now afebrile -on high flow nasal canula on 10L/min O2, satting in 90s  Plan: D/c vancomycin today - OK'd per Dr. Clementeen Graham Contine Cefepime 1 g IV q24h Monitor renal function F/u LOT ABX, clinical picture, culture data, Tmax, CBC  Height: 5\' 5"  (165.1 cm) Weight: 117 lb 8.1 oz (53.3 kg) IBW/kg (Calculated) : 57  Temp (24hrs), Avg:98.9 F (37.2 C), Min:98.4 F (36.9 C), Max:99.4 F (37.4 C)   Recent Labs Lab 04/28/16 2013 04/29/16 0302 04/30/16 0322 05/01/16 0242 05/02/16 0446  WBC 16.0* 12.9* 9.7  --   --   CREATININE 0.94 0.95 0.92 0.73 0.62    Estimated Creatinine Clearance: 40.1 mL/min (by C-G formula based on SCr of 0.62 mg/dL).    Allergies  Allergen Reactions  . Demerol Nausea And Vomiting  . Keflet [Cephalexin] Nausea And Vomiting  . Sulfa Antibiotics Nausea And Vomiting  . Tequin Nausea Only  . Penicillins Rash   Antimicrobials this admission:  Vanc 12/26 >> 12/29 Cefepime 12/26 >> Azithromycin 12/26 >> 12/27  Dose adjustments this admission:  Cefepime 1g q12 >> 1g q24 on 12/27  Microbiology results:  12/26 BCx: no growth to date x 2d 12/26 UCx: ngtd final 12/26 Sputum: normal flora 12/26 MRSA PCR: negative 12/26 Strep pneumo antigen negative  Carlean Jews, Pharm.D. PGY1 Pharmacy Resident 12/29/20178:52 AM Pager [phone]

## 2016-05-02 NOTE — Progress Notes (Signed)
PROGRESS NOTE                                                                                                                                                                                                             Patient Demographics:    Shannon [name], is a 82 y.o. female, DOB - Jan 19, 1925, [id number]  Admit date - 04/28/2016   Admitting Physician Rise Patience, MD  Outpatient Primary MD for the patient is [name], MD  LOS - 4  Outpatient Specialists:None  Chief Complaint  Patient presents with  . Respiratory Distress       Brief Narrative   82 year old female with A. fib on anticoagulation, diastolic CHF, hypertension, hypothyroidism history of cervical dysphagia brought to the ED with progressive shortness of breath for past 2 days patient's family give extra doses of Lasix without much improvement in her dyspnea. In the ED she was in acute hypoxic respiratory failure requiring BiPAP. Chest x-ray showing infiltrates suggestive of pneumonia. She had leukocytosis on her blood work. Blood cultures sent and started on empiric antibiotics and admitted to stepdown unit on BiPAP. Hospital course complicated with ongoing resp failure, failed swallow eval and afib with RVR.   Subjective:   Still requiring high flow nasal cannula (10 L). Heart rate controlled and blood pressure stable. Failed MBS.   Assessment  & Plan :    Principal Problem:   Acute respiratory failure with hypoxia (HCC) Suspect secondary to aspiration pneumonia. Narrow antibiotic to cefepime. Cultures negative, flu PCR negative. Still requiring high flow nasal cannula.    Active Problems: Aspiration pneumonia Barium swallow done in July shows severe cervical esophageal phase dysphagia and moderate cervical phase dysphagia due to suspected Zenker's diverticulum . She was discharged on dysphagia level II diet with thin liquid and  strict aspiration precautions. -pt failed bedside swallow eval and MBS. -Remains nothing by mouth. Further discussion pending goals of care with palliative care.    A-fib with RVR (Shannon [name]) Went into RVR on 12/27 not responding to IV Cardizem drip (had a low blood pressure as well). Started on amiodarone drip and rate controlled. Started on IV heparin. -Further plan per goals of care discussion.    Hypothyroidism Placed on IV Synthroid.  Protein Calorie malnutrition. Hold supplements while nothing by mouth.  Hypokalemia Replenished. Normal magnesium.  Diabetes mellitus  type 2 Monitor on sliding scale coverage.  Hypernatremia Improved with D5W.   Goals of care discussion with palliative care team this afternoon. Family understand guarded prognosis.    Code Status : DO NOT RESUSCITATE  Family Communication  : Daughter in law  at bedside  Disposition Plan  : Continue step down monitoring  Barriers For Discharge : Active symptoms  Consults  :   Palliative care  Procedures  : None  DVT Prophylaxis  :  IV heparin  Lab Results  Component Value Date   PLT 265 04/30/2016    Antibiotics  :    Anti-infectives    Start     Dose/Rate Route Frequency Ordered Stop   04/30/16 2200  vancomycin (VANCOCIN) IVPB 750 mg/150 ml premix  Status:  Discontinued     750 mg 150 mL/hr over 60 Minutes Intravenous Every 48 hours 04/29/16 0626 05/02/16 0842   04/30/16 2156  ceFEPIme (MAXIPIME) 1 g in dextrose 5 % 50 mL IVPB     1 g 100 mL/hr over 30 Minutes Intravenous Every 24 hours 04/30/16 0809     04/29/16 2200  azithromycin (ZITHROMAX) 500 mg in dextrose 5 % 250 mL IVPB  Status:  Discontinued     500 mg 250 mL/hr over 60 Minutes Intravenous Every 24 hours 04/29/16 0557 04/30/16 1314   04/29/16 0800  ceFEPIme (MAXIPIME) 1 g in dextrose 5 % 50 mL IVPB  Status:  Discontinued     1 g 100 mL/hr over 30 Minutes Intravenous Every 12 hours 04/29/16 0626 04/30/16 0809   04/28/16 2230   vancomycin (VANCOCIN) IVPB 1000 mg/200 mL premix     1,000 mg 200 mL/hr over 60 Minutes Intravenous  Once 04/28/16 2218 04/29/16 0041   04/28/16 2215  cefTRIAXone (ROCEPHIN) 1 g in dextrose 5 % 50 mL IVPB     1 g 100 mL/hr over 30 Minutes Intravenous  Once 04/28/16 2204 04/28/16 2319   04/28/16 2215  azithromycin (ZITHROMAX) 500 mg in dextrose 5 % 250 mL IVPB     500 mg 250 mL/hr over 60 Minutes Intravenous  Once 04/28/16 2204 04/29/16 0104        Objective:   Vitals:   05/02/16 0051 05/02/16 0430 05/02/16 0810 05/02/16 0830  BP: 130/68 (!) 162/84  (!) 163/86  Pulse: 77 75  76  Resp: (!) 30 (!) 31  (!) 23  Temp: 99.4 F (37.4 C) 98.4 F (36.9 C)  98 F (36.7 C)  TempSrc: Axillary Axillary  Oral  SpO2: 94% 94% 94% 97%  Weight:      Height:        Wt Readings from Last 3 Encounters:  04/29/16 53.3 kg (117 lb 8.1 oz)  11/21/15 70.3 kg (155 lb)  09/22/15 69.4 kg (153 lb)     Intake/Output Summary (Last 24 hours) at 05/02/16 1128 Last data filed at 05/02/16 0600  Gross per 24 hour  Intake           568.75 ml  Output                0 ml  Net           568.75 ml     Physical Exam  Gen: Appears fatigued, not in distress HEENT: On high flow nasal cannula, using accessory muscles of respiration, moist mucosa Chest: improved breath sounds bilaterally, scattered rhonchi CVS: S1 and S2 irregular, no murmurs rub or gallop GI: soft, NT, ND, BS+ Musculoskeletal: warm, no  edema     Data Review:    CBC  Recent Labs Lab 04/28/16 2013 04/29/16 0302 04/30/16 0322  WBC 16.0* 12.9* 9.7  HGB 14.0 13.2 12.6  HCT 41.4 38.9 39.1  PLT 307 280 265  MCV 94.1 93.3 96.1  MCH 31.8 31.7 31.0  MCHC 33.8 33.9 32.2  RDW 14.6 14.5 14.7  LYMPHSABS 0.8  --   --   MONOABS 1.1*  --   --   EOSABS 0.0  --   --   BASOSABS 0.0  --   --     Chemistries   Recent Labs Lab 04/28/16 2013 04/29/16 0302 04/29/16 0916 04/30/16 0322 05/01/16 0242 05/02/16 0446  NA 142 141  --  149*  146* 149*  K 2.9* 2.7*  --  4.1 3.1* 3.6  CL 101 100*  --  113* 113* 115*  CO2 28 27  --  23 26 25   GLUCOSE 206* 200*  --  149* 121* 117*  BUN 14 15  --  22* 23* 16  CREATININE 0.94 0.95  --  0.92 0.73 0.62  CALCIUM 10.7* 10.6*  --  10.9* 10.1 9.2  MG  --   --  1.7  --  2.1  --   AST 23  --   --   --   --   --   ALT 17  --   --   --   --   --   ALKPHOS 78  --   --   --   --   --   BILITOT 0.8  --   --   --   --   --    ------------------------------------------------------------------------------------------------------------------ No results for input(s): CHOL, HDL, LDLCALC, TRIG, CHOLHDL, LDLDIRECT in the last 72 hours.  Lab Results  Component Value Date   HGBA1C 5.6 04/29/2016   ------------------------------------------------------------------------------------------------------------------ No results for input(s): TSH, T4TOTAL, T3FREE, THYROIDAB in the last 72 hours.  Invalid input(s): FREET3 ------------------------------------------------------------------------------------------------------------------ No results for input(s): VITAMINB12, FOLATE, FERRITIN, TIBC, IRON, RETICCTPCT in the last 72 hours.  Coagulation profile No results for input(s): INR, PROTIME in the last 168 hours.  No results for input(s): DDIMER in the last 72 hours.  Cardiac Enzymes  Recent Labs Lab 04/29/16 0916  TROPONINI <0.03   ------------------------------------------------------------------------------------------------------------------    Component Value Date/Time   BNP 42.5 04/28/2016 2013    Inpatient Medications  Scheduled Meds: . ceFEPime (MAXIPIME) IV  1 g Intravenous Q24H  . insulin aspart  0-9 Units Subcutaneous TID WC  . levothyroxine  50 mcg Intravenous Daily  . mouth rinse  15 mL Mouth Rinse BID   Continuous Infusions: . amiodarone 30 mg/hr (05/02/16 1007)  . heparin 1,000 Units/hr (05/02/16 1007)   PRN Meds:.acetaminophen **OR** acetaminophen, [DISCONTINUED]  ondansetron **OR** ondansetron (ZOFRAN) IV, sodium chloride flush  Micro Results Recent Results (from the past 240 hour(s))  Blood culture (routine x 2)     Status: None (Preliminary result)   Collection Time: 04/28/16  8:15 PM  Result Value Ref Range Status   Specimen Description BLOOD RIGHT ARM  Final   Special Requests BOTTLES DRAWN AEROBIC AND ANAEROBIC 5ML  Final   Culture NO GROWTH 2 DAYS  Final   Report Status PENDING  Incomplete  Blood culture (routine x 2)     Status: None (Preliminary result)   Collection Time: 04/29/16 12:42 AM  Result Value Ref Range Status   Specimen Description BLOOD RIGHT HAND  Final   Special Requests IN PEDIATRIC  BOTTLE 4ML  Final   Culture NO GROWTH 2 DAYS  Final   Report Status PENDING  Incomplete  Culture, sputum-assessment     Status: None   Collection Time: 04/29/16  2:12 AM  Result Value Ref Range Status   Specimen Description EXPECTORATED SPUTUM  Final   Special Requests NONE  Final   Sputum evaluation THIS SPECIMEN IS ACCEPTABLE FOR SPUTUM CULTURE  Final   Report Status 04/29/2016 FINAL  Final  Culture, respiratory (NON-Expectorated)     Status: None   Collection Time: 04/29/16  2:12 AM  Result Value Ref Range Status   Specimen Description EXPECTORATED SPUTUM  Final   Special Requests NONE Reflexed from [id number]  Final   Gram Stain   Final    DEGENERATED CELLULAR MATERIAL PRESENT RARE SQUAMOUS EPITHELIAL CELLS PRESENT MODERATE GRAM POSITIVE COCCI IN PAIRS IN CLUSTERS FEW GRAM NEGATIVE RODS RARE GRAM POSITIVE RODS    Culture Consistent with normal respiratory flora.  Final   Report Status 05/01/2016 FINAL  Final  MRSA PCR Screening     Status: None   Collection Time: 04/29/16  4:59 AM  Result Value Ref Range Status   MRSA by PCR NEGATIVE NEGATIVE Final    Comment:        The GeneXpert MRSA Assay (FDA approved for NASAL specimens only), is one component of a comprehensive MRSA colonization surveillance program. It is not intended  to diagnose MRSA infection nor to guide or monitor treatment for MRSA infections.   Urine culture     Status: None   Collection Time: 04/29/16  4:42 PM  Result Value Ref Range Status   Specimen Description URINE, RANDOM  Final   Special Requests NONE  Final   Culture NO GROWTH  Final   Report Status 04/30/2016 FINAL  Final    Radiology Reports Dg Chest Port 1 View  Result Date: 04/29/2016 CLINICAL DATA:  Respiratory distress.  Hypertension. EXAM: PORTABLE CHEST 1 VIEW COMPARISON:  April 28, 2016 FINDINGS: There is no overall increase in patchy right lower lobe infiltrate compared to 1 day prior. Lungs elsewhere clear. Heart size and pulmonary vascularity are normal. No adenopathy. There is atherosclerotic calcification in the aorta. No bone lesions. IMPRESSION: Increase in patchy right lower lobe infiltrate consistent with pneumonia. Lungs elsewhere clear. Stable cardiac silhouette. There is aortic atherosclerosis. Followup PA and lateral chest radiographs recommended in 3-4 weeks following trial of antibiotic therapy to ensure resolution and exclude underlying malignancy. Electronically Signed   By: Lowella Grip III M.D.   On: 04/29/2016 11:31   Dg Chest Portable 1 View  Result Date: 04/28/2016 CLINICAL DATA:  Respiratory distress EXAM: PORTABLE CHEST 1 VIEW COMPARISON:  11/24/2015 FINDINGS: Cardiac shadow is within normal limits. Aortic calcifications are again seen. The lungs are well aerated bilaterally. Patchy changes are noted in the right lung base. No sizable effusion is seen. No bony abnormality is noted. IMPRESSION: Patchy infiltrate in the right lung base. Electronically Signed   By: Inez Catalina M.D.   On: 04/28/2016 21:12    Time Spent in minutes  35   Louellen Molder M.D on 05/02/2016 at 11:28 AM  Between 7am to 7pm - Pager - [phone]  After 7pm go to [email or web address] - password [facility]  [facility] -  Office  [phone]

## 2016-05-02 NOTE — Progress Notes (Signed)
Modified Barium Swallow Progress Note  Patient Details  Name: Shannon [name] MRN: [id number] Date of Birth: 09-03-1924  Today's Date: 05/02/2016  Modified Barium Swallow completed.  Full report located under Chart Review in the Imaging Section.  Brief recommendations include the following:  Clinical Impression  Pt exhibits dysphagia ranging from mild to severe for three phases of swallow (oral ,pharyngeal,cervical esophageal). Copious amounts barium backflowed into pt's pyriform sinuses in addition to large barium residual below the area of the cervical esophagus therefore a radiologist called to observe. He stated it did not resemble a Zenker's diverticulum but more dysmotility of the UES (described dysfunctional skeletal and muscular tissue of the UES). Frequent larygneal penetration and silent aspiration during the swallow of nectar and thin. Pt with decreased awareness of pyriform sinsus residue and cued multiple swallows were ineffective. Unfortunately pt will likely aspirate all consistencies either during or after the swallow from copious residue. Discussed with Dr. Clementeen Graham who states a Palliative care meeting is this afternoon to discuss with pt/family. If comfort feeds the goal, would initiate puree and thin. Will follow up for decision and provide any additional assistance if needed.      Swallow Evaluation Recommendations       SLP Diet Recommendations: NPO                       Oral Care Recommendations: Oral care QID        Houston Siren 05/02/2016,11:39 AM   Orbie Pyo Colvin Caroli.Ed [employer] [phone]

## 2016-05-02 NOTE — Progress Notes (Signed)
Heparin level lab scheduled at 1500 was missed due to miscommunication by staff. This was brought to attention of staff by pharmacy, and lab will be checking this level asap. Talked to pharmacy on phone who gave order to continue at current rate until lab checks current level.

## 2016-05-02 NOTE — Progress Notes (Signed)
Patient placed on BIPAP after increased work of breathing and dropping SpO2 85% on RT arrival. Patient is tolerating fairly well and RT will continue to monitor.

## 2016-05-02 NOTE — Progress Notes (Signed)
IV  Appears to have infiltrated on the R forearm and Amiodarone is currently paused. Awaiting IV consult to assess line.

## 2016-05-02 NOTE — Progress Notes (Signed)
ANTICOAGULATION CONSULT NOTE - Follow Up Consult  Pharmacy Consult for heparin Indication: atrial fibrillation  Labs:  Recent Labs  04/29/16 0916 04/30/16 0322 05/01/16 0242 05/01/16 1124 05/01/16 1142 05/01/16 1852 05/02/16 0446  HGB  --  12.6  --   --   --   --   --   HCT  --  39.1  --   --   --   --   --   PLT  --  265  --   --   --   --   --   APTT  --   --   --   --  31 52* 55*  HEPARINUNFRC  --   --   --  0.86*  --  0.70 0.63  CREATININE  --  0.92 0.73  --   --   --   --   TROPONINI <0.03  --   --   --   --   --   --     Assessment: 82yo female remains subtherapeutic on heparin with very little change in aPTT after rate change.  Goal of Therapy:  aPTT 66-102 seconds   Plan:  Will increase heparin gtt by 3 units/kg/hr to 1000 units/hr and check PTT in [place], PharmD, BCPS  05/02/2016,7:03 AM

## 2016-05-03 DIAGNOSIS — R1311 Dysphagia, oral phase: Secondary | ICD-10-CM

## 2016-05-03 DIAGNOSIS — R1310 Dysphagia, unspecified: Secondary | ICD-10-CM

## 2016-05-03 DIAGNOSIS — I4891 Unspecified atrial fibrillation: Secondary | ICD-10-CM

## 2016-05-03 DIAGNOSIS — E876 Hypokalemia: Secondary | ICD-10-CM

## 2016-05-03 DIAGNOSIS — J690 Pneumonitis due to inhalation of food and vomit: Secondary | ICD-10-CM | POA: Diagnosis present

## 2016-05-03 DIAGNOSIS — E870 Hyperosmolality and hypernatremia: Secondary | ICD-10-CM | POA: Diagnosis not present

## 2016-05-03 MED ORDER — GLYCOPYRROLATE 1 MG PO TABS: 1.0000 mg | ORAL_TABLET | ORAL | 0 refills | Status: AC | PRN

## 2016-05-03 MED ORDER — LORAZEPAM 2 MG/ML PO CONC: 1.0000 mg | ORAL | 0 refills | Status: AC | PRN

## 2016-05-03 MED ORDER — MORPHINE SULFATE (CONCENTRATE) 10 MG/0.5ML PO SOLN: 5.0000 mg | ORAL | 0 refills | Status: AC | PRN

## 2016-05-03 NOTE — Discharge Summary (Addendum)
Physician Discharge Summary  Shannon [name] [id number] DOB: Oct 10, 1924 DOA: 04/28/2016  PCP: [name], MD  Admit date: 04/28/2016 Discharge date: 05/03/2016  Admitted From: home Disposition:  Residential hospice  Recommendations for Outpatient Follow-up:  Discharge to residential hospice.    Discharge Condition: guarded CODE STATUS: DNR Diet recommendation:  Comfort feeds as tolerated    Discharge Diagnoses:  Principal Problem:   Acute respiratory failure with hypoxia ([place])  Active Problems:   Hypothyroidism   HLD (hyperlipidemia)   Hypokalemia   Dementia   Pneumonia of right lower lobe due to infectious organism [facility])   Respiratory distress   End of life care   Advance care planning   Aspiration pneumonia due to food (regurgitated) ([place])   Dysphagia   Hypernatremia   Severe protein calorie malnutrition  Brief narrative 82 year old female with A. fib on anticoagulation, diastolic CHF, hypertension, hypothyroidism history of cervical dysphagia brought to the ED with progressive shortness of breath for past 2 days patient's family give extra doses of Lasix without much improvement in her dyspnea. In the ED she was in acute hypoxic respiratory failure requiring BiPAP. Chest x-ray showing infiltrates suggestive of pneumonia. She had leukocytosis on her blood work. Blood cultures sent and started on empiric antibiotics and admitted to stepdown unit on BiPAP. Hospital course complicated with ongoing resp failure, failed swallow eval and afib with RVR.  Principal Problem:   Acute respiratory failure with hypoxia (HCC) secondary to aspiration pneumonia. Received empiric antibiotics.c Cultures negative, flu PCR negative. required BiPAP on admission, now requiring high flow nasal cannula.    Active Problems: Aspiration pneumonia Barium swallow done in July shows severe cervical esophageal phase dysphagia and moderate cervical phase dysphagia due to suspected  Zenker's diverticulum . She was discharged on dysphagia level II diet with thin liquid and strict aspiration precautions. -pt failed bedside swallow eval and MBS this admission. remained NPO. Now goal for comfort and allowing comfort feeds..     A-fib with RVR ([place]) Went into RVR on 12/27 not responding to IV Cardizem drip (had a low blood pressure as well). Started on amiodarone drip and rate controlled. Started on IV heparin. -now off meds with goal for comfort.    Hypothyroidism Resume synthroid  Protein Calorie malnutrition, severe.   Hypokalemia Replenished. Normal magnesium.  Diabetes mellitus type 2   Hypernatremia Improved with D5W.   Goals of care discussion with palliative care team. Given guarded prognosis family agree on residential hospice placement.   -Goals for full comfort. Off BiPAP and amiodarone drip. Off heparin drip No rehospitalization. -minimize medications -add roxanol for pain and dyspnea, add ativan prn for anxiety. -comfort feeds as tolerated      Family Communication  : spoke with son on the phone  Disposition Plan  : residential hospice  Consults  :   Palliative care  Procedures  : None  Discharge Instructions   Allergies as of 05/03/2016      Reactions   Demerol Nausea And Vomiting   Keflet [cephalexin] Nausea And Vomiting   Sulfa Antibiotics Nausea And Vomiting   Tequin Nausea Only   Penicillins Rash      Medication List    STOP taking these medications   atorvastatin 10 MG tablet Commonly known as:  LIPITOR   cefUROXime 250 MG tablet Commonly known as:  CEFTIN   ciprofloxacin 250 MG tablet Commonly known as:  CIPRO   diltiazem 180 MG 24 hr capsule Commonly known as:  CARDIZEM CD  ELIQUIS 2.5 MG Tabs tablet Generic drug:  apixaban   OVER THE COUNTER MEDICATION   pantoprazole 40 MG tablet Commonly known as:  PROTONIX   propafenone 150 MG tablet Commonly known as:  RYTHMOL   Vitamin D  2000 units tablet     TAKE these medications   furosemide 40 MG tablet Commonly known as:  LASIX Take 40 mg by mouth 2 (two) times daily.   glycopyrrolate 1 MG tablet Commonly known as:  ROBINUL Take 1 tablet (1 mg total) by mouth every 4 (four) hours as needed (excessive secretions).   levothyroxine 137 MCG tablet Commonly known as:  SYNTHROID, LEVOTHROID Take 137 mcg by mouth daily before breakfast.   LORazepam 2 MG/ML concentrated solution Commonly known as:  ATIVAN Place 0.5 mLs (1 mg total) under the tongue every 4 (four) hours as needed for anxiety or sleep.   morphine CONCENTRATE 10 MG/0.5ML Soln concentrated solution Take 0.25 mLs (5 mg total) by mouth every 30 (thirty) minutes as needed for moderate pain or shortness of breath (or dyspnea).   MYRBETRIQ 50 MG Tb24 tablet Generic drug:  mirabegron ER Take 50 mg by mouth daily.   polyethylene glycol packet Commonly known as:  MIRALAX / GLYCOLAX Take 17 g by mouth daily.   sertraline 25 MG tablet Commonly known as:  ZOLOFT Take 25 mg by mouth daily.      Follow-up Information    residential hospice Follow up.          Allergies  Allergen Reactions  . Demerol Nausea And Vomiting  . Keflet [Cephalexin] Nausea And Vomiting  . Sulfa Antibiotics Nausea And Vomiting  . Tequin Nausea Only  . Penicillins Rash      Procedures/Studies: Dg Chest Port 1 View  Result Date: 04/29/2016 CLINICAL DATA:  Respiratory distress.  Hypertension. EXAM: PORTABLE CHEST 1 VIEW COMPARISON:  April 28, 2016 FINDINGS: There is no overall increase in patchy right lower lobe infiltrate compared to 1 day prior. Lungs elsewhere clear. Heart size and pulmonary vascularity are normal. No adenopathy. There is atherosclerotic calcification in the aorta. No bone lesions. IMPRESSION: Increase in patchy right lower lobe infiltrate consistent with pneumonia. Lungs elsewhere clear. Stable cardiac silhouette. There is aortic atherosclerosis.  Followup PA and lateral chest radiographs recommended in 3-4 weeks following trial of antibiotic therapy to ensure resolution and exclude underlying malignancy. Electronically Signed   By: Lowella Grip III M.D.   On: 04/29/2016 11:31   Dg Chest Portable 1 View  Result Date: 04/28/2016 CLINICAL DATA:  Respiratory distress EXAM: PORTABLE CHEST 1 VIEW COMPARISON:  11/24/2015 FINDINGS: Cardiac shadow is within normal limits. Aortic calcifications are again seen. The lungs are well aerated bilaterally. Patchy changes are noted in the right lung base. No sizable effusion is seen. No bony abnormality is noted. IMPRESSION: Patchy infiltrate in the right lung base. Electronically Signed   By: Inez Catalina M.D.   On: 04/28/2016 21:12   Dg Swallowing Func-speech Pathology  Result Date: 05/02/2016 Objective Swallowing Evaluation: Type of Study: MBS-Modified Barium Swallow Study Patient Details Name: Shannon [name] MRN: [id number] Date of Birth: Sep 10, 1924 Today's Date: 05/02/2016 Time: SLP Start Time (ACUTE ONLY): 1026-SLP Stop Time (ACUTE ONLY): 1052 SLP Time Calculation (min) (ACUTE ONLY): 26 min Past Medical History: Past Medical History: Diagnosis Date . A-fib ([place])  . Cancer ([place])   left breast . Constipation  . Dementia  . GERD (gastroesophageal reflux disease)  . GERD (gastroesophageal reflux disease)  . HLD (hyperlipidemia)  .  Hx of radiation therapy 2001  left breast . Hypertension  . Hypothyroidism  Past Surgical History: Past Surgical History: Procedure Laterality Date . BREAST SURGERY   . FEMUR IM NAIL Left 11/23/2015  Procedure: INTRAMEDULLARY (IM) NAIL INTERTROCHANTRIC LEFT HIP;  Surgeon: Meredith Pel, MD;  Location: [facility];  Service: Orthopedics;  Laterality: Left; . INTRAMEDULLARY (IM) NAIL INTERTROCHANTERIC Right 09/24/2014  Procedure: INTRAMEDULLARY (IM) NAIL INTERTROCHANTRIC;  Surgeon: Meredith Pel, MD;  Location: [facility];  Service: Orthopedics;  Laterality: Right; HPI: ZAIAH GARBARINI  a 82 y.o.femalewith history of atrial fibrillation, diastolic CHF, hypertension, hypothyroidism was brought to the ER with progress worsening shortness of breath.  Chest x-ray showed infiltrates, lab work showed leukocytosis and patient febrile. RN suctioned food particles along with white frothy sputum. RT placed pt on Bipap. Of note, pt with Modified Barium Swallow Study on 11/26/2015 which revealed Severe cervical esophageal phase dysphagia and Moderate cervical esophageal phase dysphagia d/t suspected Zenker's diverticulum, poor clearnance of barium through cervical esophagus due to stasis from diverticulum and resultant backflow into pharynx/pyriform sinus WITHOUT pt sensation. Pt discharged on dysphagia 2 diet with thin liquids and strict aspiration precautions. Pt has been seen for bedside swallow assessments and follow up sessions recommended MBS. Subjective: alert, confused, requesting "to go back on tube" Assessment / Plan / Recommendation CHL IP CLINICAL IMPRESSIONS 05/02/2016 Therapy Diagnosis Mild oral phase dysphagia;Severe cervical esophageal phase dysphagia;Mild pharyngeal phase dysphagia;Moderate pharyngeal phase dysphagia Clinical Impression Pt exhibits dysphagia ranging from mild to severe for three phases of swallow (oral ,pharyngeal,cervical esophageal). Copious amounts barium backflowed into pt's pyriform sinuses in addition to large barium residual below the area of the cervical esophagus therefore a radiologist called to observe. He stated it did not resemble a Zenker's diverticulum but more dysmotility of the UES (described dysfunctional skeletal and muscular tissue of the UES). Frequent larygneal penetration and silent aspiration during the swallow of nectar and thin. Pt with decreased awareness of pyriform sinsus residue and cued multiple swallows were ineffective. Unfortunately pt will likely aspirate all consistencies either during or after the swallow from copious residue. Discussed  with Dr. Clementeen Graham who states a Palliative care meeting is this afternoon to discuss with pt/family. If comfort feeds the goal, would initiate puree and thin. Will follow up for decision and provide any additional assistance if needed.    Impact on safety and function Severe aspiration risk   CHL IP TREATMENT RECOMMENDATION 04/30/2016 Treatment Recommendations Therapy as outlined in treatment plan below   Prognosis 05/02/2016 Prognosis for Safe Diet Advancement Guarded Barriers to Reach Goals Severity of deficits Barriers/Prognosis Comment -- CHL IP DIET RECOMMENDATION 05/02/2016 SLP Diet Recommendations NPO Liquid Administration via -- Medication Administration -- Compensations -- Postural Changes --   CHL IP OTHER RECOMMENDATIONS 05/02/2016 Recommended Consults -- Oral Care Recommendations Oral care QID Other Recommendations --   CHL IP FOLLOW UP RECOMMENDATIONS 05/02/2016 Follow up Recommendations Skilled Nursing facility   [facility] IP FREQUENCY AND DURATION 05/02/2016 Speech Therapy Frequency (ACUTE ONLY) min 1 x/week Treatment Duration 1 week      CHL IP ORAL PHASE 05/02/2016 Oral Phase Impaired Oral - Pudding Teaspoon -- Oral - Pudding Cup -- Oral - Honey Teaspoon -- Oral - Honey Cup -- Oral - Nectar Teaspoon -- Oral - Nectar Cup -- Oral - Nectar Straw -- Oral - Thin Teaspoon -- Oral - Thin Cup -- Oral - Thin Straw -- Oral - Puree -- Oral - Mech Soft -- Oral - Regular Delayed oral  transit Oral - Multi-Consistency -- Oral - Pill -- Oral Phase - Comment --  CHL IP PHARYNGEAL PHASE 05/02/2016 Pharyngeal Phase Impaired Pharyngeal- Pudding Teaspoon -- Pharyngeal -- Pharyngeal- Pudding Cup -- Pharyngeal -- Pharyngeal- Honey Teaspoon Delayed swallow initiation-vallecula;Pharyngeal residue - pyriform Pharyngeal -- Pharyngeal- Honey Cup Delayed swallow initiation-vallecula;Pharyngeal residue - pyriform Pharyngeal -- Pharyngeal- Nectar Teaspoon Delayed swallow initiation-vallecula;Pharyngeal residue - pyriform;Delayed swallow  initiation-pyriform sinuses Pharyngeal -- Pharyngeal- Nectar Cup Penetration/Aspiration during swallow;Delayed swallow initiation-vallecula Pharyngeal Material enters airway, passes BELOW cords without attempt by patient to eject out (silent aspiration) Pharyngeal- Nectar Straw -- Pharyngeal -- Pharyngeal- Thin Teaspoon -- Pharyngeal -- Pharyngeal- Thin Cup Penetration/Aspiration during swallow;Delayed swallow initiation-vallecula;Trace aspiration Pharyngeal Material enters airway, passes BELOW cords without attempt by patient to eject out (silent aspiration) Pharyngeal- Thin Straw -- Pharyngeal -- Pharyngeal- Puree -- Pharyngeal -- Pharyngeal- Mechanical Soft -- Pharyngeal -- Pharyngeal- Regular Delayed swallow initiation-vallecula;Pharyngeal residue - pyriform Pharyngeal -- Pharyngeal- Multi-consistency -- Pharyngeal -- Pharyngeal- Pill -- Pharyngeal -- Pharyngeal Comment --  CHL IP CERVICAL ESOPHAGEAL PHASE 05/02/2016 Cervical Esophageal Phase Impaired Pudding Teaspoon -- Pudding Cup -- Honey Teaspoon -- Honey Cup -- Nectar Teaspoon -- Nectar Cup -- Nectar Straw -- Thin Teaspoon -- Thin Cup -- Thin Straw -- Puree -- Mechanical Soft -- Regular -- Multi-consistency -- Pill -- Cervical Esophageal Comment -- No flowsheet data found. [place] Siren 05/02/2016, 11:38 AM Orbie Pyo Colvin Caroli[employer] [phone]                  Subjective: Taken off amiodarone and heparin drip after decided on comfort measures by family. On high flow [place]  Discharge Exam: Vitals:   05/03/16 0857 05/03/16 0900  BP: (!) 156/102 (!) 148/107  Pulse: 82 79  Resp: (!) 22 19  Temp:     Vitals:   05/03/16 0700 05/03/16 0800 05/03/16 0857 05/03/16 0900  BP: (!) 173/96 (!) 156/102 (!) 156/102 (!) 148/107  Pulse: 78 77 82 79  Resp: 15 (!) 24 (!) 22 19  Temp:  98.8 F (37.1 C)    TempSrc:  Oral    SpO2: 99% 100% 100% 99%  Weight:      Height:        Gen: Appears fatigued, not in distress HEENT: On high flow  nasal cannula, using accessory muscles of respiration, moist mucosa Chest: improved breath sounds bilaterally, scattered rhonchi CVS: S1 and S2 irregular, no murmurs rub or gallop GI: soft, NT, ND, BS+ Musculoskeletal: warm, no edema CNS: AAOX1-2 confused    The results of significant diagnostics from this hospitalization (including imaging, microbiology, ancillary and laboratory) are listed below for reference.     Microbiology: Recent Results (from the past 240 hour(s))  Blood culture (routine x 2)     Status: None (Preliminary result)   Collection Time: 04/28/16  8:15 PM  Result Value Ref Range Status   Specimen Description BLOOD RIGHT ARM  Final   Special Requests BOTTLES DRAWN AEROBIC AND ANAEROBIC 5ML  Final   Culture NO GROWTH 4 DAYS  Final   Report Status PENDING  Incomplete  Blood culture (routine x 2)     Status: None (Preliminary result)   Collection Time: 04/29/16 12:42 AM  Result Value Ref Range Status   Specimen Description BLOOD RIGHT HAND  Final   Special Requests IN PEDIATRIC BOTTLE 4ML  Final   Culture NO GROWTH 4 DAYS  Final   Report Status PENDING  Incomplete  Culture, sputum-assessment  Status: None   Collection Time: 04/29/16  2:12 AM  Result Value Ref Range Status   Specimen Description EXPECTORATED SPUTUM  Final   Special Requests NONE  Final   Sputum evaluation THIS SPECIMEN IS ACCEPTABLE FOR SPUTUM CULTURE  Final   Report Status 04/29/2016 FINAL  Final  Culture, respiratory (NON-Expectorated)     Status: None   Collection Time: 04/29/16  2:12 AM  Result Value Ref Range Status   Specimen Description EXPECTORATED SPUTUM  Final   Special Requests NONE Reflexed from [id number]  Final   Gram Stain   Final    DEGENERATED CELLULAR MATERIAL PRESENT RARE SQUAMOUS EPITHELIAL CELLS PRESENT MODERATE GRAM POSITIVE COCCI IN PAIRS IN CLUSTERS FEW GRAM NEGATIVE RODS RARE GRAM POSITIVE RODS    Culture Consistent with normal respiratory flora.  Final   Report  Status 05/01/2016 FINAL  Final  MRSA PCR Screening     Status: None   Collection Time: 04/29/16  4:59 AM  Result Value Ref Range Status   MRSA by PCR NEGATIVE NEGATIVE Final    Comment:        The GeneXpert MRSA Assay (FDA approved for NASAL specimens only), is one component of a comprehensive MRSA colonization surveillance program. It is not intended to diagnose MRSA infection nor to guide or monitor treatment for MRSA infections.   Urine culture     Status: None   Collection Time: 04/29/16  4:42 PM  Result Value Ref Range Status   Specimen Description URINE, RANDOM  Final   Special Requests NONE  Final   Culture NO GROWTH  Final   Report Status 04/30/2016 FINAL  Final     Labs: BNP (last 3 results)  Recent Labs  04/28/16 2013  BNP [id number]   Basic Metabolic Panel:  Recent Labs Lab 04/28/16 2013 04/29/16 0302 04/29/16 0916 04/30/16 0322 05/01/16 0242 05/02/16 0446  NA 142 141  --  149* 146* 149*  K 2.9* 2.7*  --  4.1 3.1* 3.6  CL 101 100*  --  113* 113* 115*  CO2 28 27  --  23 26 25   GLUCOSE 206* 200*  --  149* 121* 117*  BUN 14 15  --  22* 23* 16  CREATININE 0.94 0.95  --  0.92 0.73 0.62  CALCIUM 10.7* 10.6*  --  10.9* 10.1 9.2  MG  --   --  1.7  --  2.1  --    Liver Function Tests:  Recent Labs Lab 04/28/16 2013  AST 23  ALT 17  ALKPHOS 78  BILITOT 0.8  PROT 7.1  ALBUMIN 3.5   No results for input(s): LIPASE, AMYLASE in the last 168 hours. No results for input(s): AMMONIA in the last 168 hours. CBC:  Recent Labs Lab 04/28/16 2013 04/29/16 0302 04/30/16 0322  WBC 16.0* 12.9* 9.7  NEUTROABS 14.2*  --   --   HGB 14.0 13.2 12.6  HCT 41.4 38.9 39.1  MCV 94.1 93.3 96.1  PLT 307 280 265   Cardiac Enzymes:  Recent Labs Lab 04/29/16 0916  TROPONINI <0.03   BNP: Invalid input(s): POCBNP CBG:  Recent Labs Lab 05/01/16 2143 05/02/16 0832 05/02/16 1212 05/02/16 1621 05/02/16 2229  GLUCAP 104* 90 107* 90 77   D-Dimer No results  for input(s): DDIMER in the last 72 hours. Hgb A1c No results for input(s): HGBA1C in the last 72 hours. Lipid Profile No results for input(s): CHOL, HDL, LDLCALC, TRIG, CHOLHDL, LDLDIRECT in the last 72 hours. Thyroid  function studies No results for input(s): TSH, T4TOTAL, T3FREE, THYROIDAB in the last 72 hours.  Invalid input(s): FREET3 Anemia work up No results for input(s): VITAMINB12, FOLATE, FERRITIN, TIBC, IRON, RETICCTPCT in the last 72 hours. Urinalysis    Component Value Date/Time   COLORURINE YELLOW 04/29/2016 1643   APPEARANCEUR HAZY (A) 04/29/2016 1643   LABSPEC 1.015 04/29/2016 1643   PHURINE 6.0 04/29/2016 1643   GLUCOSEU NEGATIVE 04/29/2016 1643   HGBUR LARGE (A) 04/29/2016 1643   BILIRUBINUR NEGATIVE 04/29/2016 1643   KETONESUR NEGATIVE 04/29/2016 1643   PROTEINUR 30 (A) 04/29/2016 1643   UROBILINOGEN 0.2 09/23/2014 2201   NITRITE NEGATIVE 04/29/2016 1643   LEUKOCYTESUR NEGATIVE 04/29/2016 1643   Sepsis Labs Invalid input(s): PROCALCITONIN,  WBC,  LACTICIDVEN Microbiology Recent Results (from the past 240 hour(s))  Blood culture (routine x 2)     Status: None (Preliminary result)   Collection Time: 04/28/16  8:15 PM  Result Value Ref Range Status   Specimen Description BLOOD RIGHT ARM  Final   Special Requests BOTTLES DRAWN AEROBIC AND ANAEROBIC 5ML  Final   Culture NO GROWTH 4 DAYS  Final   Report Status PENDING  Incomplete  Blood culture (routine x 2)     Status: None (Preliminary result)   Collection Time: 04/29/16 12:42 AM  Result Value Ref Range Status   Specimen Description BLOOD RIGHT HAND  Final   Special Requests IN PEDIATRIC BOTTLE 4ML  Final   Culture NO GROWTH 4 DAYS  Final   Report Status PENDING  Incomplete  Culture, sputum-assessment     Status: None   Collection Time: 04/29/16  2:12 AM  Result Value Ref Range Status   Specimen Description EXPECTORATED SPUTUM  Final   Special Requests NONE  Final   Sputum evaluation THIS SPECIMEN IS  ACCEPTABLE FOR SPUTUM CULTURE  Final   Report Status 04/29/2016 FINAL  Final  Culture, respiratory (NON-Expectorated)     Status: None   Collection Time: 04/29/16  2:12 AM  Result Value Ref Range Status   Specimen Description EXPECTORATED SPUTUM  Final   Special Requests NONE Reflexed from [id number]  Final   Gram Stain   Final    DEGENERATED CELLULAR MATERIAL PRESENT RARE SQUAMOUS EPITHELIAL CELLS PRESENT MODERATE GRAM POSITIVE COCCI IN PAIRS IN CLUSTERS FEW GRAM NEGATIVE RODS RARE GRAM POSITIVE RODS    Culture Consistent with normal respiratory flora.  Final   Report Status 05/01/2016 FINAL  Final  MRSA PCR Screening     Status: None   Collection Time: 04/29/16  4:59 AM  Result Value Ref Range Status   MRSA by PCR NEGATIVE NEGATIVE Final    Comment:        The GeneXpert MRSA Assay (FDA approved for NASAL specimens only), is one component of a comprehensive MRSA colonization surveillance program. It is not intended to diagnose MRSA infection nor to guide or monitor treatment for MRSA infections.   Urine culture     Status: None   Collection Time: 04/29/16  4:42 PM  Result Value Ref Range Status   Specimen Description URINE, RANDOM  Final   Special Requests NONE  Final   Culture NO GROWTH  Final   Report Status 04/30/2016 FINAL  Final     Time coordinating discharge: Over 30 minutes  SIGNED:   Louellen Molder, MD  [facility] 05/03/2016, 10:51 AM Pager   If 7PM-7AM, please contact night-coverage [email or web address] Password [email or web address]

## 2016-05-03 NOTE — Clinical Social Work Note (Signed)
Patient has a bed at [facility] today. MD notified. CSW will assist with DC when appropriate.   Liz Beach MSW, [place], [place], [id number]

## 2016-05-03 NOTE — Clinical Social Work Note (Signed)
Per MD patient ready for DC to [facility]. DC Summary faxed to [facility]. RN, patient, patient's family (DIL Julliana), and facility notified of DC. RN given number for report. DC packet on chart. Ambulance transport will be requested for by [facility] liaison once paperwork and family meeting is complete. CSW signing off.   Liz Beach MSW, [place], [place], [id number]

## 2016-05-03 NOTE — Clinical Social Work Note (Signed)
CSW confirmed with patient's DIL Shannon [name] that plan is for [facility]. [place] states that the patient's son Shannon [name] is sleeping at this time as he has been sick. Residential hospice referral made to [facility].  Liz Beach MSW, [place], [place], [id number]

## 2016-05-04 LAB — CULTURE, BLOOD (ROUTINE X 2)
CULTURE: NO GROWTH
Culture: NO GROWTH

## 2016-06-05 DEATH — deceased

## 2017-07-22 IMAGING — CR DG HIP (WITH OR WITHOUT PELVIS) 2-3V*L*
3 series · 3 of 3 positions shown · non-contrast
Comparison: Pelvis and right hip radiographs 09/23/2014

CLINICAL DATA: Fall today out of wheelchair. Left hip pain with
external rotation.

EXAM:
DG HIP (WITH OR WITHOUT PELVIS) 2-3V LEFT

[x pelvis]
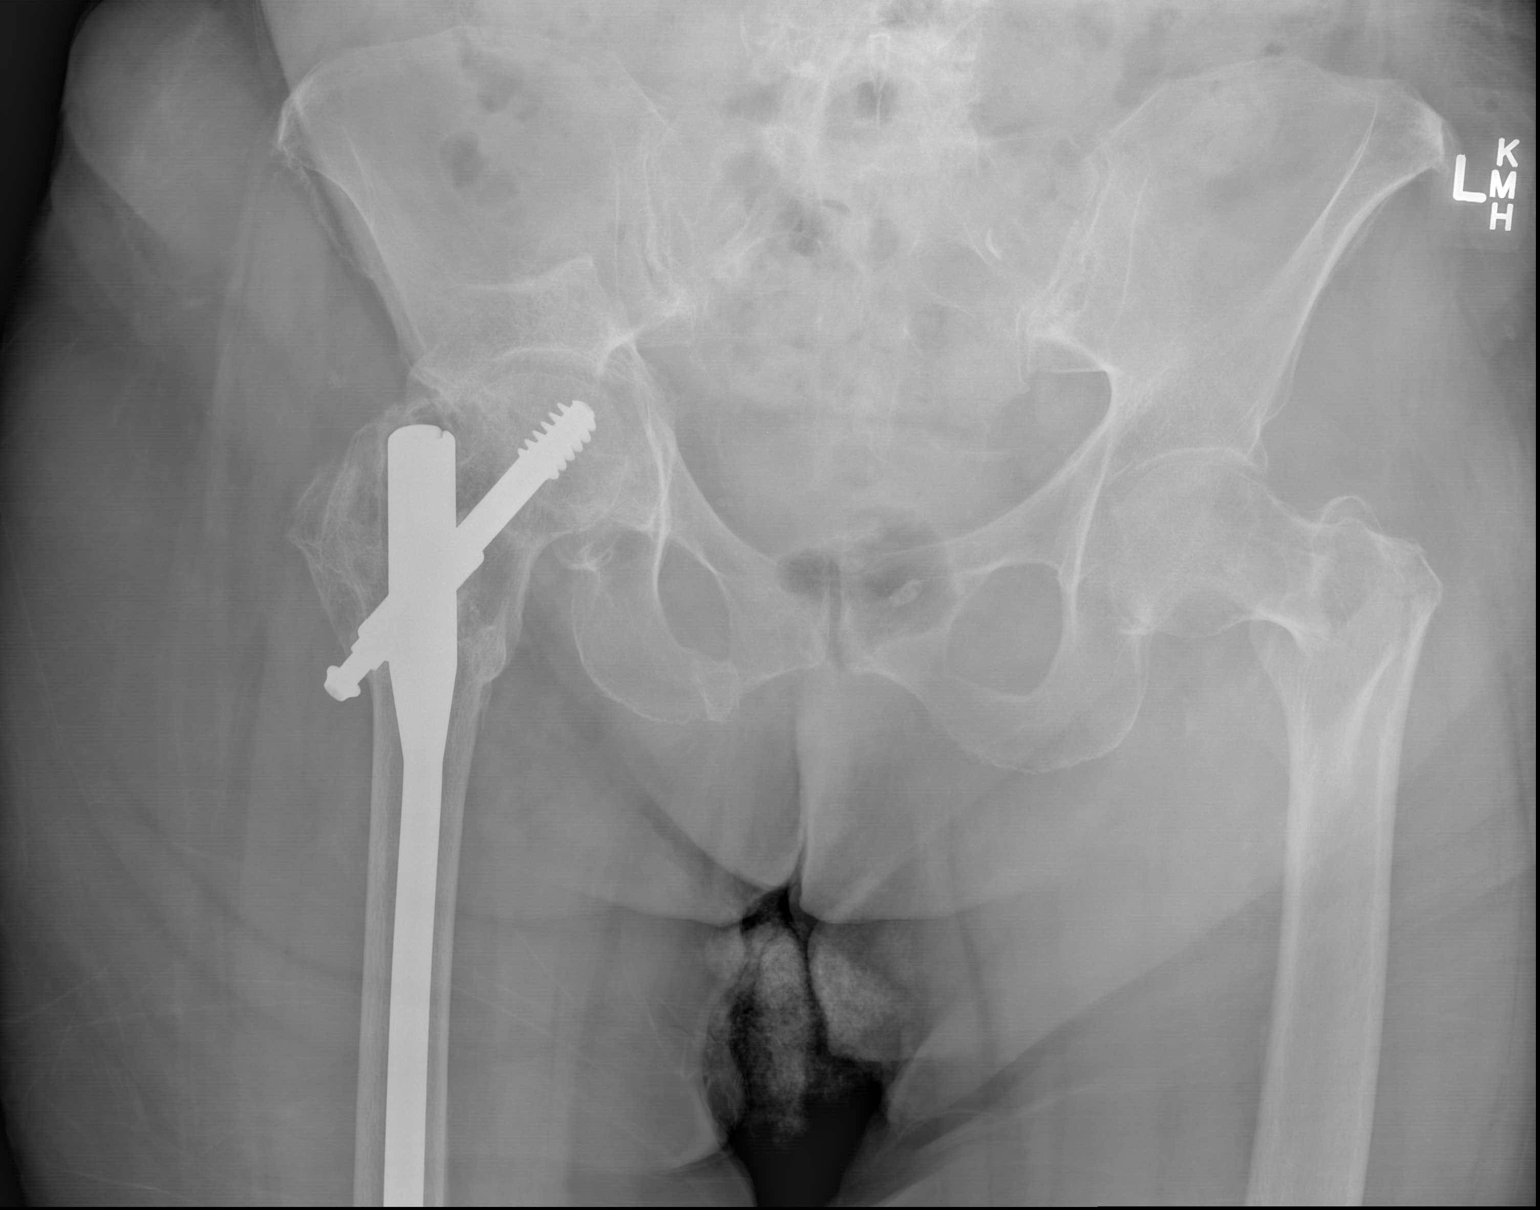

[x hip ap left]
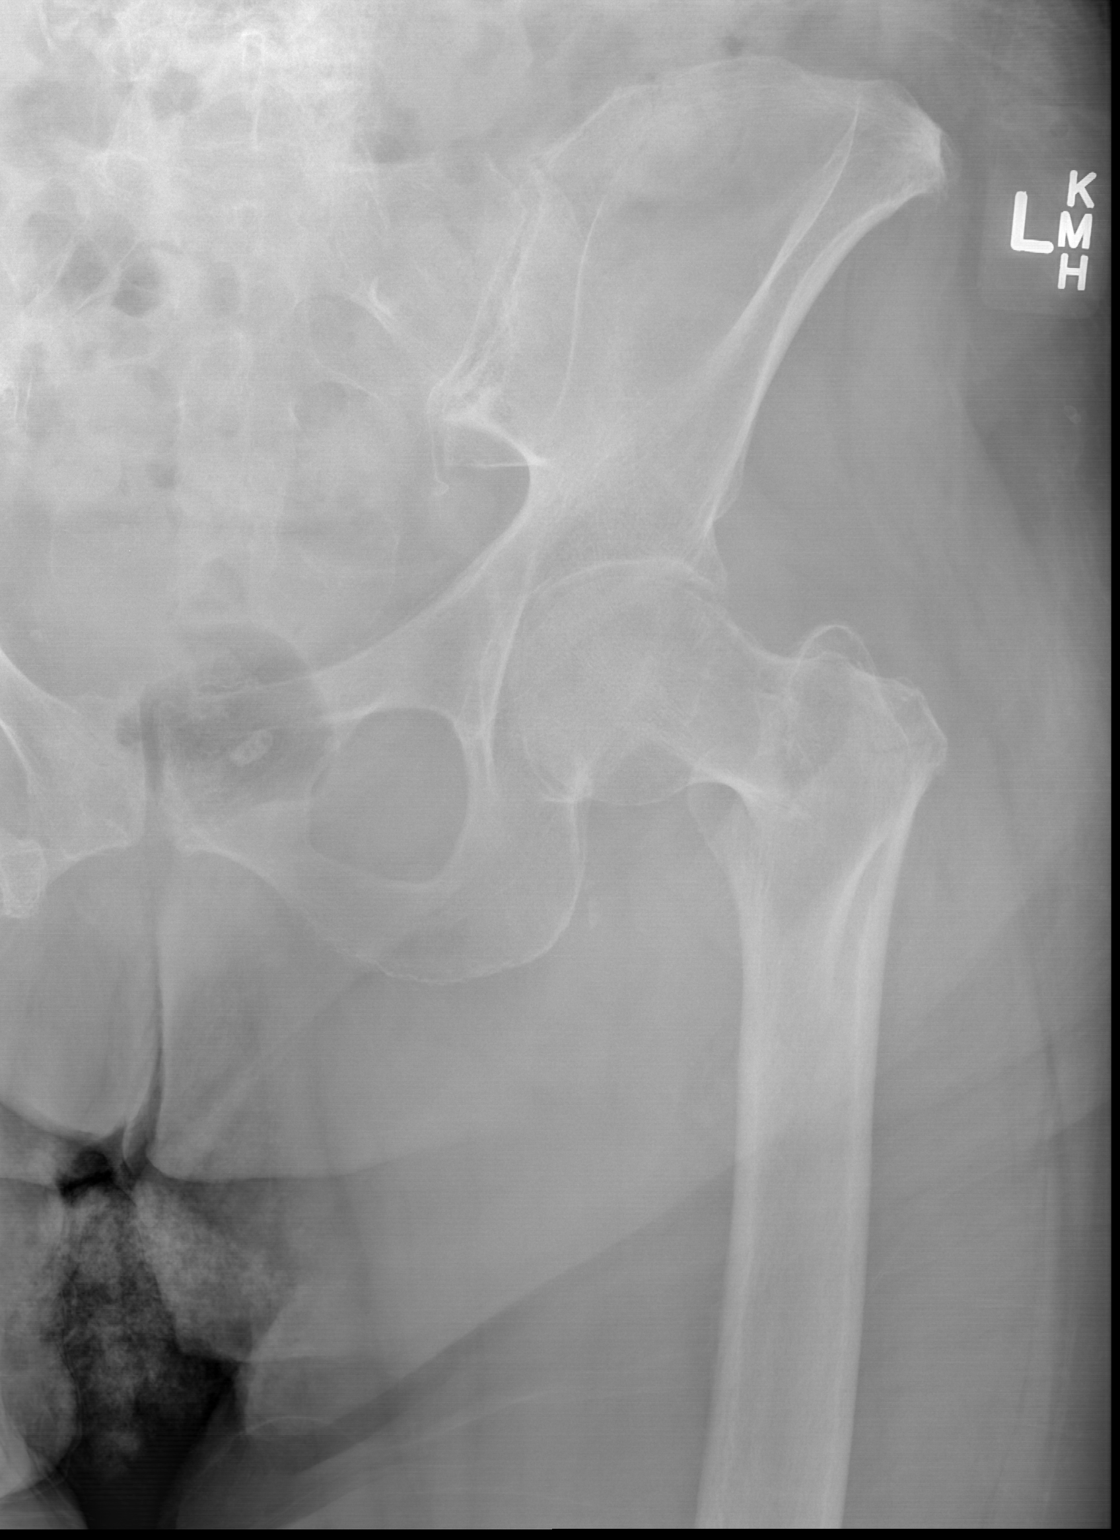

[w hip lat left]
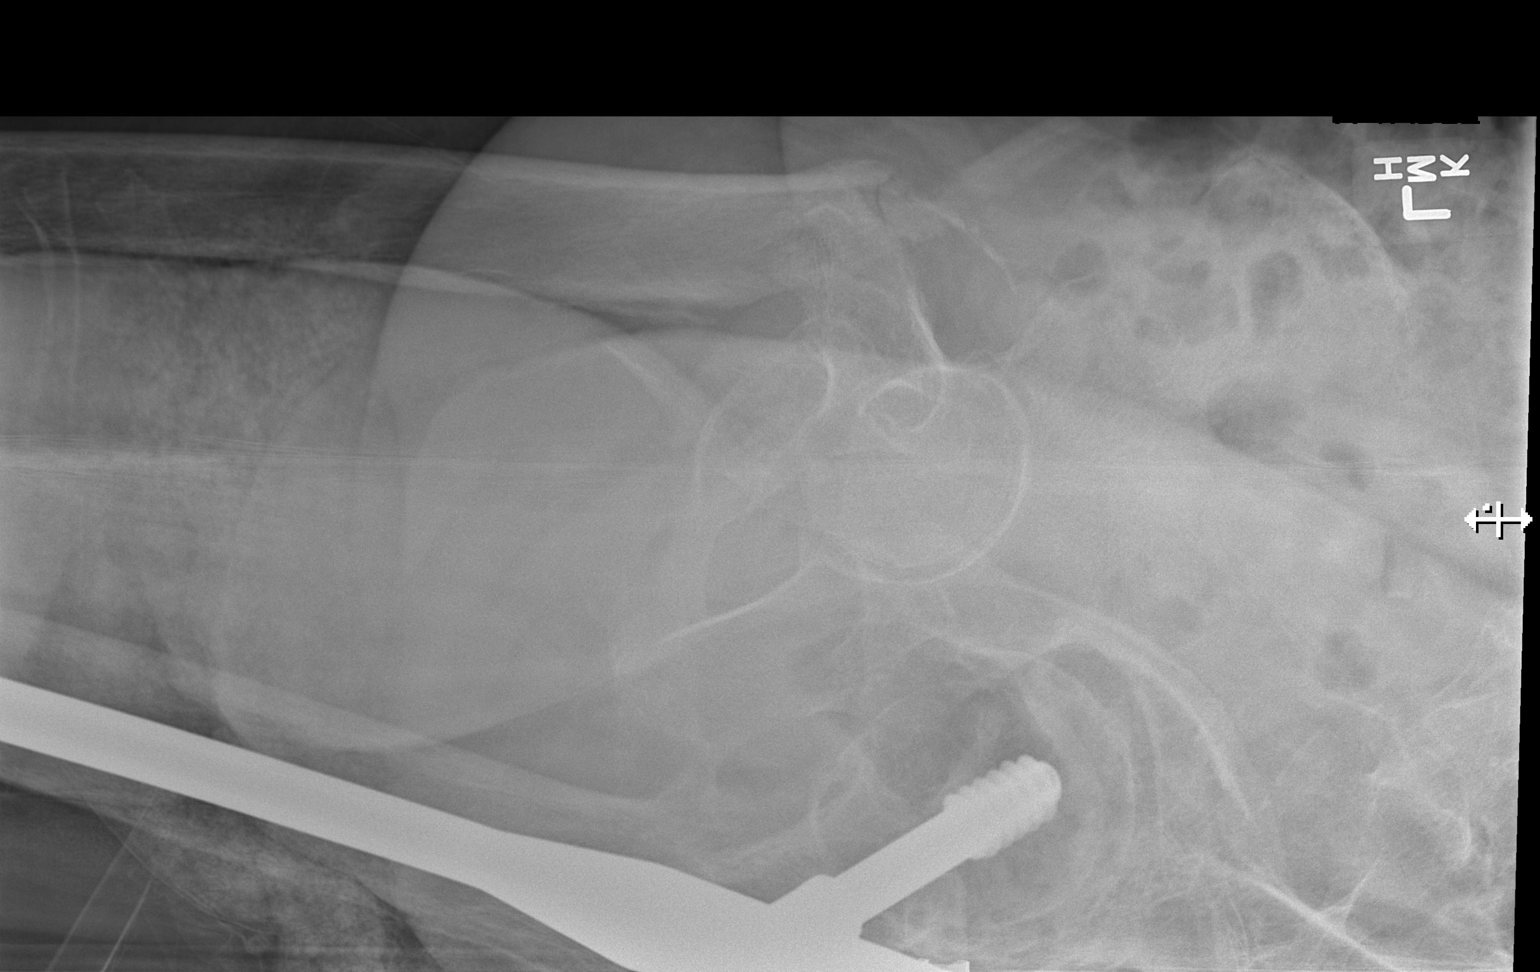

[3 of 3 positions shown; findings below may reference images not displayed]

FINDINGS: Comminuted angulated intertrochanteric fracture left femur. There is
displacement and proximal migration of the femoral shaft. Femoral
head remains seated in the acetabulum. Post ORIF right hip
intertrochanteric fracture. Probable healed fracture of the right
inferior pubic ramus. The bones are under mineralized.
IMPRESSION: Comminuted intertrochanteric left hip fracture.

## 2017-07-24 IMAGING — RF DG FEMUR 2+V*L*
1 series · 4 of 4 positions shown · non-contrast
Comparison: Left hip radiograph 11/21/2015.

CLINICAL DATA: Patient status post ORIF left femur fracture.

EXAM:
LEFT FEMUR 2 VIEWS; DG C-ARM 1-60 MIN-NO REPORT

[Series 1: run · 4 of 4 slices shown]
[im 1/4]
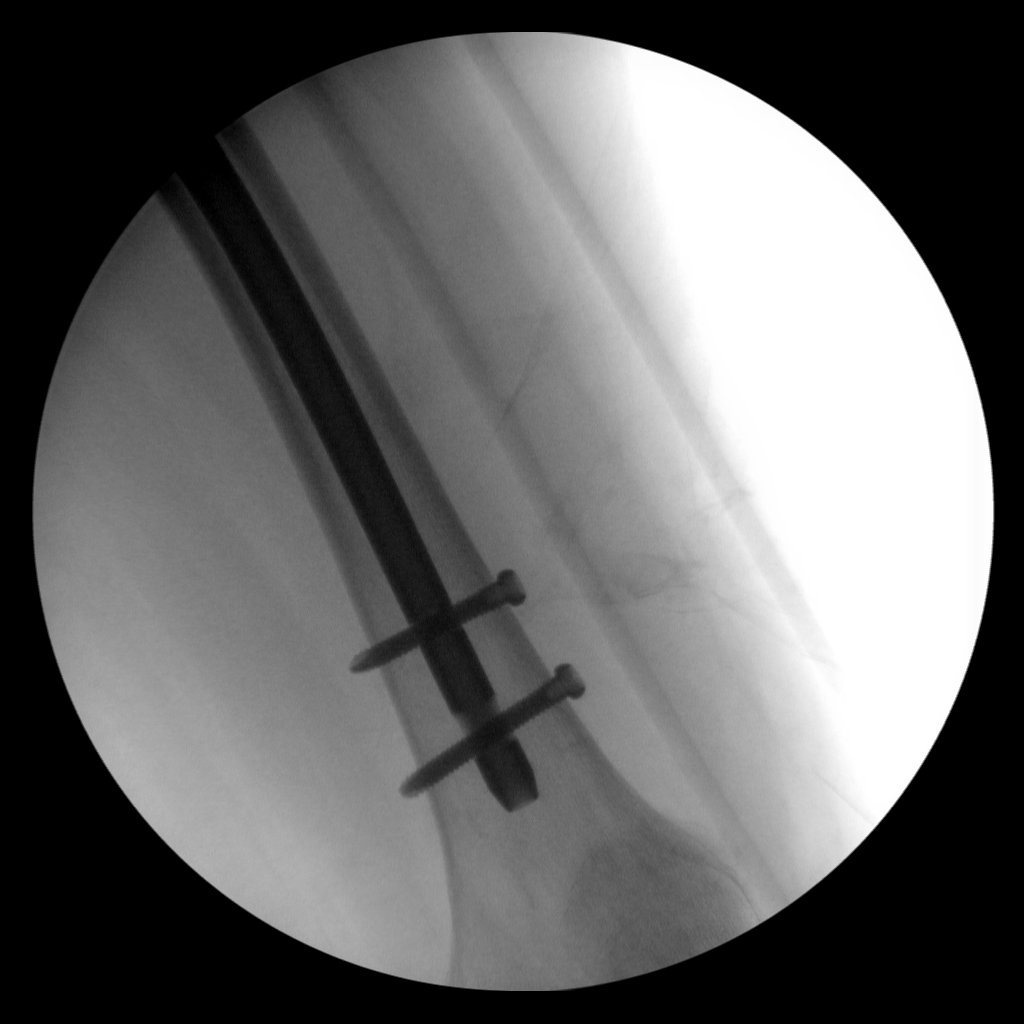
[im 2/4]
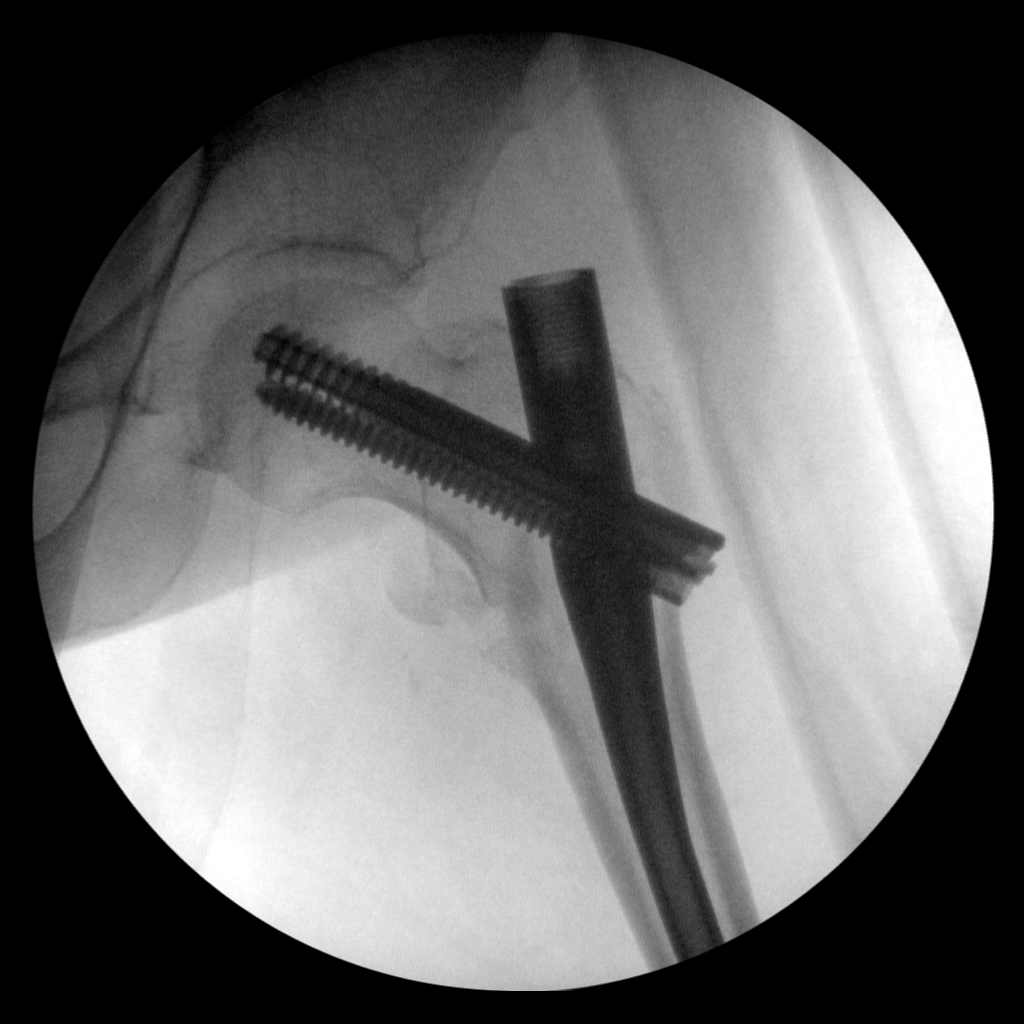
[im 3/4]
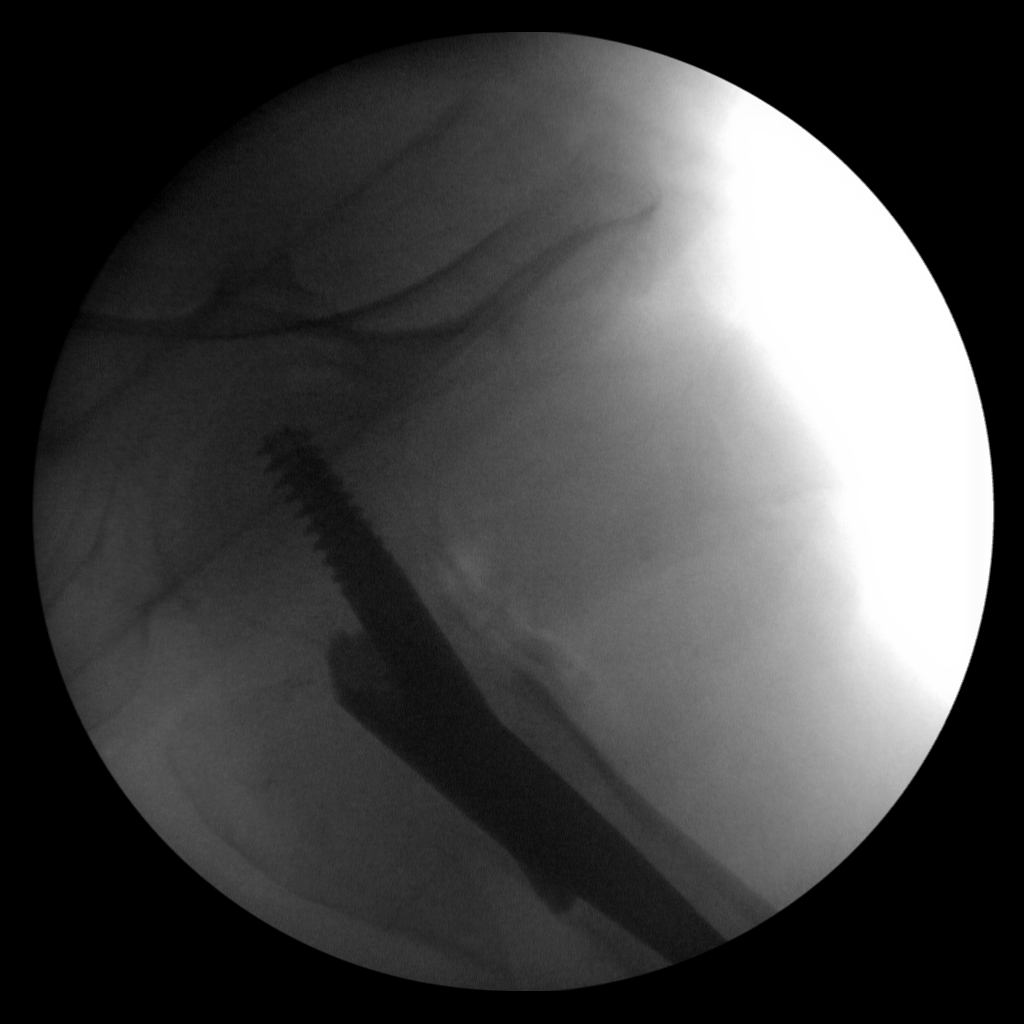
[im 4/4]
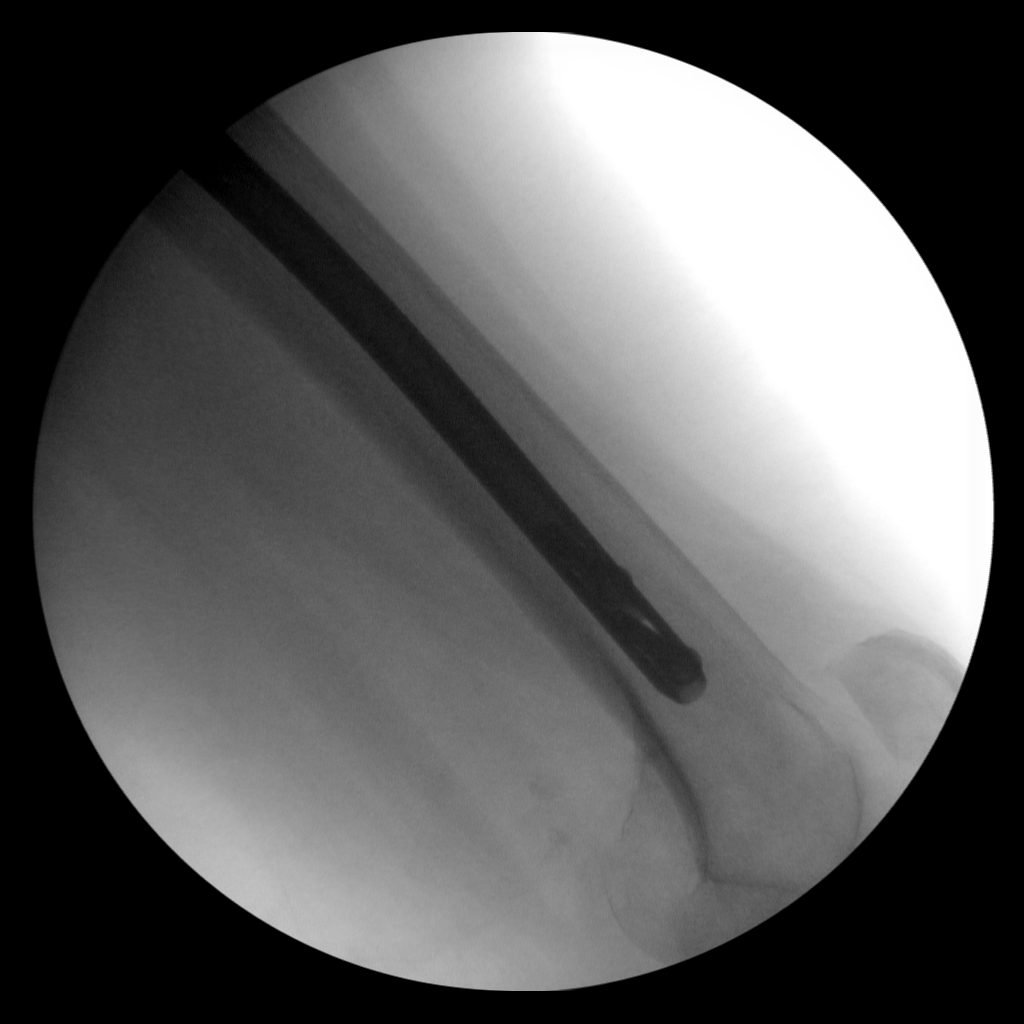

[4 of 4 positions shown; findings below may reference images not displayed]

FINDINGS: 4 intraoperative fluoroscopic images were submitted. Patient status
post intra medullary rod and screw fixation of comminuted
intertrochanteric left hip fracture. Improved anatomic alignment.
Hardware appears intact.
IMPRESSION: Patient status post ORIF left femur fracture with intra medullary
rod and screw fixation.

## 2017-07-24 IMAGING — DX DG PORTABLE PELVIS
1 series · 1 of 1 positions shown · non-contrast
Comparison: 11/21/2015

CLINICAL DATA: Hip fracture.  Initial encounter.

EXAM:
PORTABLE PELVIS 1-2 VIEWS

[pelvis ap]
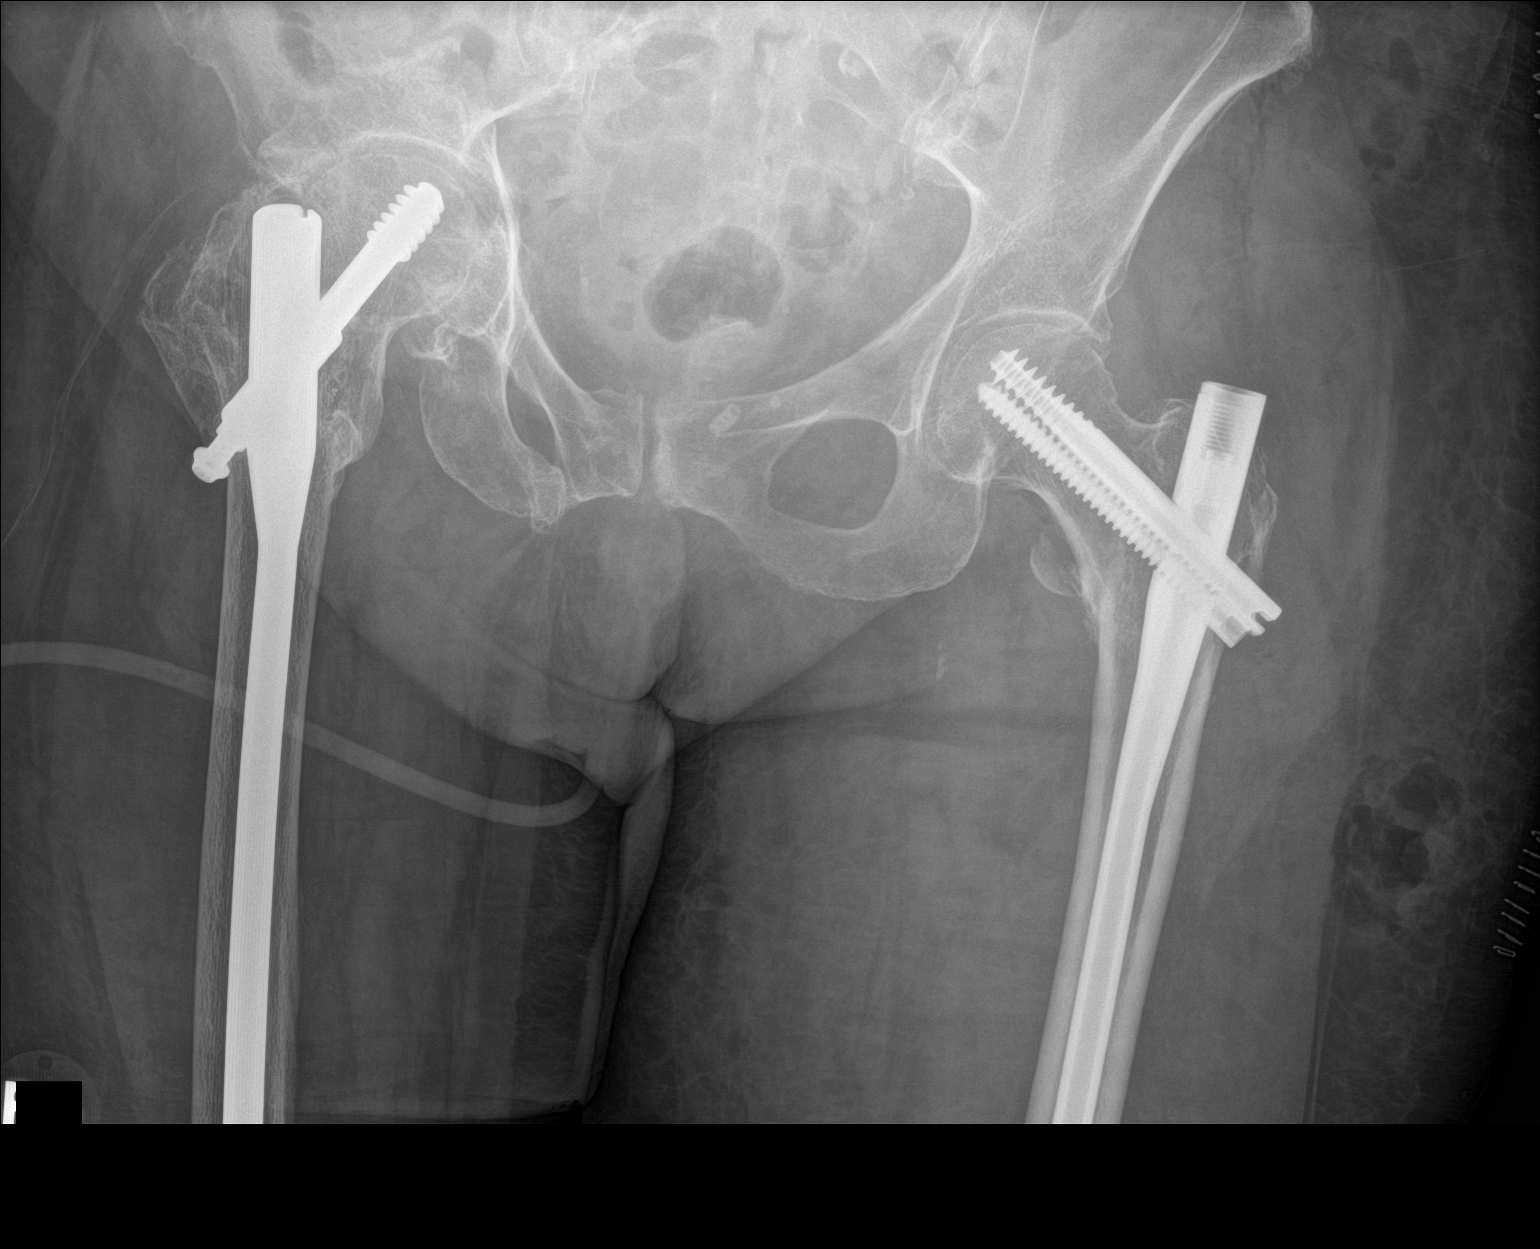

[1 of 1 positions shown; findings below may reference images not displayed]

FINDINGS: There has been interval ORIF of the previously described
intertrochanteric fracture of the left femur. An antegrade
intramedullary nail is partially visualized with 2 proximal screws
which terminate in the femoral head. Fracture alignment appears near
anatomic on this single AP image. The femoral heads are approximated
with the acetabula on this single projection. An old right inferior
pubic ramus fracture and prior internal fixation of the right femur
are again noted.
IMPRESSION: Interval internal fixation of intertrochanteric left femur fracture
as above.
